# Patient Record
Sex: Female | Born: 1955
Health system: Southern US, Community
[De-identification: ages and names within clinical notes are randomized; demographics above are authoritative.]

## PROBLEM LIST (undated history)

## (undated) DIAGNOSIS — I10 Essential (primary) hypertension: Secondary | ICD-10-CM

## (undated) DIAGNOSIS — M439 Deforming dorsopathy, unspecified: Secondary | ICD-10-CM

## (undated) DIAGNOSIS — M1990 Unspecified osteoarthritis, unspecified site: Secondary | ICD-10-CM

## (undated) DIAGNOSIS — R112 Nausea with vomiting, unspecified: Secondary | ICD-10-CM

## (undated) DIAGNOSIS — Z86018 Personal history of other benign neoplasm: Secondary | ICD-10-CM

## (undated) DIAGNOSIS — I341 Nonrheumatic mitral (valve) prolapse: Secondary | ICD-10-CM

## (undated) DIAGNOSIS — Z9889 Other specified postprocedural states: Secondary | ICD-10-CM

## (undated) DIAGNOSIS — R251 Tremor, unspecified: Secondary | ICD-10-CM

## (undated) DIAGNOSIS — F419 Anxiety disorder, unspecified: Secondary | ICD-10-CM

## (undated) HISTORY — DX: Personal history of other benign neoplasm: Z86.018

## (undated) HISTORY — DX: Anxiety disorder, unspecified: F41.9

## (undated) HISTORY — PX: COLONOSCOPY: SHX174

## (undated) HISTORY — PX: COSMETIC SURGERY: SHX468

## (undated) HISTORY — PX: EYE SURGERY: SHX253

---

## 1979-01-26 DIAGNOSIS — O1490 Unspecified pre-eclampsia, unspecified trimester: Secondary | ICD-10-CM

## 2004-05-22 ENCOUNTER — Ambulatory Visit: Payer: Self-pay

## 2004-08-20 ENCOUNTER — Ambulatory Visit: Payer: Self-pay | Admitting: Obstetrics and Gynecology

## 2005-08-23 ENCOUNTER — Ambulatory Visit: Payer: Self-pay | Admitting: Obstetrics and Gynecology

## 2006-08-25 ENCOUNTER — Ambulatory Visit: Payer: Self-pay | Admitting: Obstetrics and Gynecology

## 2007-08-28 ENCOUNTER — Ambulatory Visit: Payer: Self-pay | Admitting: Obstetrics and Gynecology

## 2008-08-28 ENCOUNTER — Ambulatory Visit: Payer: Self-pay | Admitting: Gynecology

## 2013-04-24 LAB — HM DEXA SCAN

## 2013-09-28 LAB — HM MAMMOGRAPHY

## 2013-12-10 LAB — HEPATIC FUNCTION PANEL
ALT: 14 U/L (ref 7–35)
AST: 20 U/L (ref 13–35)

## 2013-12-10 LAB — LIPID PANEL
CHOLESTEROL: 216 mg/dL — AB (ref 0–200)
HDL: 91 mg/dL — AB (ref 35–70)
LDL Cholesterol: 107 mg/dL
Triglycerides: 92 mg/dL (ref 40–160)

## 2013-12-10 LAB — BASIC METABOLIC PANEL
BUN: 14 mg/dL (ref 4–21)
Creatinine: 0.7 mg/dL (ref 0.5–1.1)
Glucose: 106 mg/dL
POTASSIUM: 4 mmol/L (ref 3.4–5.3)
SODIUM: 139 mmol/L (ref 137–147)

## 2013-12-10 LAB — CBC AND DIFFERENTIAL
HCT: 38 % (ref 36–46)
HEMOGLOBIN: 12.8 g/dL (ref 12.0–16.0)
Platelets: 235 10*3/uL (ref 150–399)
WBC: 5.6 10*3/mL

## 2013-12-10 LAB — TSH: TSH: 2.91 u[IU]/mL (ref 0.41–5.90)

## 2014-05-23 ENCOUNTER — Encounter: Payer: Self-pay | Admitting: *Deleted

## 2014-05-23 NOTE — Anesthesia Preprocedure Evaluation (Deleted)

## 2014-05-30 NOTE — Discharge Instructions (Signed)

## 2014-05-31 ENCOUNTER — Ambulatory Visit: Payer: BLUE CROSS/BLUE SHIELD | Admitting: Anesthesiology

## 2014-05-31 ENCOUNTER — Encounter: Payer: Self-pay | Admitting: *Deleted

## 2014-05-31 ENCOUNTER — Ambulatory Visit
Admission: RE | Admit: 2014-05-31 | Discharge: 2014-05-31 | Disposition: A | Discharge status: Home / Self Care | Payer: BLUE CROSS/BLUE SHIELD | Source: Ambulatory Visit | Attending: Gastroenterology | Admitting: Gastroenterology

## 2014-05-31 ENCOUNTER — Encounter
Admission: RE | Disposition: A | Discharge status: Home / Self Care | Payer: Self-pay | Source: Ambulatory Visit | Attending: Gastroenterology

## 2014-05-31 DIAGNOSIS — K635 Polyp of colon: Secondary | ICD-10-CM | POA: Diagnosis not present

## 2014-05-31 DIAGNOSIS — K5730 Diverticulosis of large intestine without perforation or abscess without bleeding: Secondary | ICD-10-CM | POA: Insufficient documentation

## 2014-05-31 DIAGNOSIS — Z1211 Encounter for screening for malignant neoplasm of colon: Secondary | ICD-10-CM | POA: Insufficient documentation

## 2014-05-31 DIAGNOSIS — K640 First degree hemorrhoids: Secondary | ICD-10-CM | POA: Insufficient documentation

## 2014-05-31 DIAGNOSIS — I10 Essential (primary) hypertension: Secondary | ICD-10-CM | POA: Diagnosis not present

## 2014-05-31 DIAGNOSIS — I341 Nonrheumatic mitral (valve) prolapse: Secondary | ICD-10-CM | POA: Diagnosis not present

## 2014-05-31 HISTORY — PX: COLONOSCOPY: SHX5424

## 2014-05-31 HISTORY — DX: Other specified postprocedural states: R11.2

## 2014-05-31 HISTORY — DX: Deforming dorsopathy, unspecified: M43.9

## 2014-05-31 HISTORY — DX: Essential (primary) hypertension: I10

## 2014-05-31 HISTORY — DX: Nausea with vomiting, unspecified: R11.2

## 2014-05-31 HISTORY — DX: Tremor, unspecified: R25.1

## 2014-05-31 HISTORY — DX: Nonrheumatic mitral (valve) prolapse: I34.1

## 2014-05-31 HISTORY — DX: Nausea with vomiting, unspecified: Z98.890

## 2014-05-31 LAB — HM COLONOSCOPY

## 2014-05-31 SURGERY — COLONOSCOPY
Anesthesia: Monitor Anesthesia Care | Wound class: Contaminated

## 2014-05-31 MED ORDER — ACETAMINOPHEN 325 MG PO TABS
325.0000 mg | ORAL_TABLET | ORAL | Status: DC | PRN
Start: 2014-05-31 — End: 2014-05-31

## 2014-05-31 MED ORDER — PROPOFOL 10 MG/ML IV BOLUS
INTRAVENOUS | Status: DC | PRN
  Administered 2014-05-31 (×6): 50 mg via INTRAVENOUS

## 2014-05-31 MED ORDER — STERILE WATER FOR IRRIGATION IR SOLN
Status: DC | PRN
  Administered 2014-05-31: 11:00:00

## 2014-05-31 MED ORDER — LACTATED RINGERS IV SOLN
INTRAVENOUS | Status: DC
  Administered 2014-05-31: 10:00:00 via INTRAVENOUS

## 2014-05-31 MED ORDER — LIDOCAINE HCL (CARDIAC) 20 MG/ML IV SOLN
INTRAVENOUS | Status: DC | PRN
  Administered 2014-05-31: 50 mg via INTRAVENOUS

## 2014-05-31 MED ORDER — ACETAMINOPHEN 160 MG/5ML PO SOLN
325.0000 mg | ORAL | Status: DC | PRN
Start: 2014-05-31 — End: 2014-05-31

## 2014-05-31 MED ORDER — SODIUM CHLORIDE 0.9 % IV SOLN: INTRAVENOUS | Status: DC

## 2014-05-31 SURGICAL SUPPLY — 27 items
CANISTER SUCT 1200ML W/VALVE (MISCELLANEOUS) ×2 IMPLANT
FCP ESCP3.2XJMB 240X2.8X (MISCELLANEOUS) ×1
FORCEPS BIOP RAD 4 LRG CAP 4 (CUTTING FORCEPS) IMPLANT
FORCEPS BIOP RJ4 240 W/NDL (MISCELLANEOUS) ×1
FORCEPS ESCP3.2XJMB 240X2.8X (MISCELLANEOUS) ×1 IMPLANT
GOWN CVR UNV OPN BCK APRN NK (MISCELLANEOUS) ×2 IMPLANT
GOWN ISOL THUMB LOOP REG UNIV (MISCELLANEOUS) ×2
HEMOCLIP INSTINCT (CLIP) IMPLANT
INJECTOR VARIJECT VIN23 (MISCELLANEOUS) IMPLANT
KIT CO2 TUBING (TUBING) ×2 IMPLANT
KIT DEFENDO VALVE AND CONN (KITS) IMPLANT
KIT ENDO PROCEDURE OLY (KITS) ×2 IMPLANT
LIGATOR MULTIBAND 6SHOOTER MBL (MISCELLANEOUS) IMPLANT
MARKER SPOT ENDO TATTOO 5ML (MISCELLANEOUS) IMPLANT
PAD GROUND ADULT SPLIT (MISCELLANEOUS) IMPLANT
SNARE SHORT THROW 13M SML OVAL (MISCELLANEOUS) IMPLANT
SNARE SHORT THROW 30M LRG OVAL (MISCELLANEOUS) IMPLANT
SPOT EX ENDOSCOPIC TATTOO (MISCELLANEOUS)
SUCTION POLY TRAP 4CHAMBER (MISCELLANEOUS) IMPLANT
TRAP SUCTION POLY (MISCELLANEOUS) IMPLANT
TUBING CONN 6MMX3.1M (TUBING)
TUBING SUCTION CONN 0.25 STRL (TUBING) IMPLANT
UNDERPAD 30X60 958B10 (PK) (MISCELLANEOUS) IMPLANT
VALVE BIOPSY ENDO (VALVE) IMPLANT
VARIJECT INJECTOR VIN23 (MISCELLANEOUS)
WATER AUXILLARY (MISCELLANEOUS) IMPLANT
WATER STERILE IRR 500ML POUR (IV SOLUTION) ×2 IMPLANT

## 2014-05-31 NOTE — OR Nursing (Signed)
SCOPE IN MARKED FOR COLON IN TIME. IT WAS CORRECTED. THERE IS NO UPPER SCOPE IN TIME ONLY COLON SCOPE IN TIME.

## 2014-05-31 NOTE — Anesthesia Preprocedure Evaluation (Signed)
Anesthesia Evaluation    Reviewed: Allergy & Precautions, H&P , Patient's Chart, lab work & pertinent test results, reviewed documented beta blocker date and time   History of Anesthesia Complications (+) PONV and history of anesthetic complications  Airway Mallampati: I  TM Distance: >3 FB Neck ROM: full    Dental no notable dental hx.    Pulmonary neg pulmonary ROS,    Pulmonary exam normal       Cardiovascular hypertension, Normal cardiovascular exam    Neuro/Psych    GI/Hepatic negative GI ROS, Neg liver ROS,   Endo/Other  negative endocrine ROS  Renal/GU negative Renal ROS     Musculoskeletal   Abdominal   Peds  Hematology negative hematology ROS (+)   Anesthesia Other Findings   Reproductive/Obstetrics                             Anesthesia Physical Anesthesia Plan  ASA: II  Anesthesia Plan: MAC   Post-op Pain Management:    Induction:   Airway Management Planned:   Additional Equipment:   Intra-op Plan:   Post-operative Plan:   Informed Consent: I have reviewed the patients History and Physical, chart, labs and discussed the procedure including the risks, benefits and alternatives for the proposed anesthesia with the patient or authorized representative who has indicated his/her understanding and acceptance.     Plan Discussed with: CRNA  Anesthesia Plan Comments:         Anesthesia Quick Evaluation

## 2014-05-31 NOTE — Transfer of Care (Signed)
Immediate Anesthesia Transfer of Care Note  Patient: Tracie Hall  Procedure(s) Performed: Procedure(s): COLONOSCOPY (N/A)  Patient Location: PACU  Anesthesia Type: MAC  Level of Consciousness: awake, alert  and patient cooperative  Airway and Oxygen Therapy: Patient Spontanous Breathing and Patient connected to supplemental oxygen  Post-op Assessment: Post-op Vital signs reviewed, Patient's Cardiovascular Status Stable, Respiratory Function Stable, Patent Airway and No signs of Nausea or vomiting  Post-op Vital Signs: Reviewed and stable  Complications: No apparent anesthesia complications

## 2014-05-31 NOTE — Anesthesia Procedure Notes (Signed)
Procedure Name: MAC Performed by: Kynesha Guerin Pre-anesthesia Checklist: Patient identified, Emergency Drugs available, Suction available, Timeout performed and Patient being monitored Patient Re-evaluated:Patient Re-evaluated prior to inductionOxygen Delivery Method: Nasal cannula Placement Confirmation: positive ETCO2     

## 2014-05-31 NOTE — Op Note (Signed)
[facility] Patient Name: Tracie Hall Procedure Date: 05/31/2014 11:11 AM MRN: [id number] Account #: [id number] Date of Birth: February 11, 1955 Admit Type: Outpatient Age: 59 Room: [facility] OR ROOM 01 Gender: Female Note Status: Finalized Procedure:         Colonoscopy Indications:       Screening for colorectal malignant neoplasm Providers:         Lucilla Lame, MD Medicines:         Propofol per Anesthesia Complications:     No immediate complications. Procedure:         Pre-Anesthesia Assessment:                    - Prior to the procedure, a History and Physical was                     performed, and patient medications and allergies were                     reviewed. The patient's tolerance of previous anesthesia                     was also reviewed. The risks and benefits of the procedure                     and the sedation options and risks were discussed with the                     patient. All questions were answered, and informed consent                     was obtained. Prior Anticoagulants: The patient has taken                     no previous anticoagulant or antiplatelet agents. ASA                     Grade Assessment: II - A patient with mild systemic                     disease. After reviewing the risks and benefits, the                     patient was deemed in satisfactory condition to undergo                     the procedure.                    After obtaining informed consent, the colonoscope was                     passed under direct vision. Throughout the procedure, the                     patient's blood pressure, pulse, and oxygen saturations                     were monitored continuously. The Olympus CF H180AL                     colonoscope (S#: [id number]) was introduced through the anus                     and advanced to the the cecum, identified by  appendiceal                     orifice and ileocecal valve. The  colonoscopy was performed                     without difficulty. The patient tolerated the procedure                     well. The quality of the bowel preparation was excellent. Findings:      The perianal and digital rectal examinations were normal.      A 5 mm polyp was found in the ascending colon. The polyp was sessile.       The polyp was removed with a cold biopsy forceps. Resection and       retrieval were complete.      Multiple small-mouthed diverticula were found in the entire colon.      Non-bleeding internal hemorrhoids were found during retroflexion. The       hemorrhoids were Grade I (internal hemorrhoids that do not prolapse). Impression:        - One 5 mm polyp in the ascending colon. Resected and                     retrieved.                    - Diverticulosis in the entire examined colon.                    - Non-bleeding internal hemorrhoids. Recommendation:    - Await pathology results.                    - Repeat colonoscopy in 5 years if polyp adenoma and 10                     years if hyperplastic Procedure Code(s): --- Professional ---                    [id number], Colonoscopy, flexible; with biopsy, single or                     multiple Diagnosis Code(s): --- Professional ---                    Z12.11, Encounter for screening for malignant neoplasm of                     colon                    D12.2, Benign neoplasm of ascending colon CPT copyright 2014 American Medical Association. All rights reserved. The codes documented in this report are preliminary and upon coder review may  be revised to meet current compliance requirements. Lucilla Lame, MD 05/31/2014 11:38:09 AM This report has been signed electronically. Number of Addenda: 0 Note Initiated On: 05/31/2014 11:11 AM Scope Withdrawal Time: 0 hours 7 minutes 3 seconds  Total Procedure Duration: 0 hours 10 minutes 43 seconds       [facility]

## 2014-05-31 NOTE — Anesthesia Postprocedure Evaluation (Signed)
  Anesthesia Post-op Note  Patient: Tracie Hall  Procedure(s) Performed: Procedure(s): COLONOSCOPY (N/A)  Anesthesia type:MAC  Patient location: PACU  Post pain: Pain level controlled  Post assessment: Post-op Vital signs reviewed, Patient's Cardiovascular Status Stable, Respiratory Function Stable, Patent Airway and No signs of Nausea or vomiting  Post vital signs: Reviewed and stable  Last Vitals:  Filed Vitals:   05/31/14 1155  BP:   Pulse: 70  Temp:   Resp: 13    Level of consciousness: awake, alert  and patient cooperative  Complications: No apparent anesthesia complications
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
## 2014-06-04 ENCOUNTER — Encounter: Payer: Self-pay | Admitting: Gastroenterology

## 2014-06-28 ENCOUNTER — Encounter: Payer: Self-pay | Admitting: Gastroenterology

## 2014-08-19 ENCOUNTER — Other Ambulatory Visit: Payer: Self-pay | Admitting: Family Medicine

## 2014-08-19 DIAGNOSIS — I10 Essential (primary) hypertension: Secondary | ICD-10-CM

## 2014-10-11 ENCOUNTER — Telehealth: Payer: Self-pay | Admitting: Family Medicine

## 2014-10-11 NOTE — Telephone Encounter (Signed)
Needs to schedule with me. Needs to check with insurance whether she should get it here or pharmacy. Thanks.

## 2014-10-11 NOTE — Telephone Encounter (Signed)
Pt is requesting to have a shingles shot.XY#[phone]/OL

## 2014-10-11 NOTE — Telephone Encounter (Signed)
Left message to call back  

## 2014-10-11 NOTE — Telephone Encounter (Signed)
Can patient be scheduled during flu clinic or does she need to have an OV? Please advise. Thanks!

## 2014-10-15 NOTE — Telephone Encounter (Signed)
LMTCB 10/15/2014  Thanks,   -Mickel Baas

## 2014-10-15 NOTE — Telephone Encounter (Signed)
Scheduled pt for an ov and shingles vaccine for 11/12/2014 at 02:30 pm. Pt stated that she checked with her insurance and that it covers it.   Thanks,

## 2014-11-12 ENCOUNTER — Ambulatory Visit (INDEPENDENT_AMBULATORY_CARE_PROVIDER_SITE_OTHER): Payer: BLUE CROSS/BLUE SHIELD | Admitting: Family Medicine

## 2014-11-12 ENCOUNTER — Ambulatory Visit: Payer: Self-pay | Admitting: Family Medicine

## 2014-11-12 ENCOUNTER — Encounter: Payer: Self-pay | Admitting: Family Medicine

## 2014-11-12 VITALS — BP 138/72 | HR 88 | Temp 97.7°F | Resp 16 | Ht 66.0 in | Wt 147.0 lb

## 2014-11-12 DIAGNOSIS — I10 Essential (primary) hypertension: Secondary | ICD-10-CM

## 2014-11-12 DIAGNOSIS — Z23 Encounter for immunization: Secondary | ICD-10-CM | POA: Diagnosis not present
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
## 2015-01-17 ENCOUNTER — Encounter: Payer: Self-pay | Admitting: Family Medicine

## 2015-05-07 ENCOUNTER — Ambulatory Visit (INDEPENDENT_AMBULATORY_CARE_PROVIDER_SITE_OTHER): Payer: BLUE CROSS/BLUE SHIELD | Admitting: Obstetrics and Gynecology

## 2015-05-07 ENCOUNTER — Encounter: Payer: Self-pay | Admitting: Obstetrics and Gynecology

## 2015-05-07 VITALS — BP 144/81 | HR 78 | Ht 66.0 in | Wt 148.9 lb

## 2015-05-07 DIAGNOSIS — Z780 Asymptomatic menopausal state: Secondary | ICD-10-CM

## 2015-05-07 DIAGNOSIS — N895 Stricture and atresia of vagina: Secondary | ICD-10-CM

## 2015-05-07 DIAGNOSIS — N952 Postmenopausal atrophic vaginitis: Secondary | ICD-10-CM | POA: Diagnosis not present

## 2015-05-07 DIAGNOSIS — Z01419 Encounter for gynecological examination (general) (routine) without abnormal findings: Secondary | ICD-10-CM

## 2015-05-07 DIAGNOSIS — Z1239 Encounter for other screening for malignant neoplasm of breast: Secondary | ICD-10-CM | POA: Diagnosis not present

## 2015-05-07 MED ORDER — ESTROGENS, CONJUGATED 0.625 MG/GM VA CREA
0.5000 g | TOPICAL_CREAM | VAGINAL | Status: DC
Start: 2015-05-07 — End: 2015-07-14
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
## 2015-05-07 NOTE — Patient Instructions (Addendum)
1. Pap smear is completed 2. Mammogram is scheduled 3. Stool guaiac cards not given for colon cancer screening (colonoscopy completed in 05/2014) 4. Continue with calcium and vitamin D supplementation 5. Trial of Premarin cream intravaginal 1/2 g twice a week 6. Return in 3 months for follow-up 7. Return in 1 year for annual exam [email or web address] (website for [employer])

## 2015-05-12 LAB — PAP IG AND HPV HIGH-RISK
HPV, HIGH-RISK: NEGATIVE
PAP Smear Comment: 0

## 2015-05-14 ENCOUNTER — Telehealth: Payer: Self-pay | Admitting: *Deleted

## 2015-05-14 NOTE — Telephone Encounter (Signed)
Patient called and stted that [place] called her about her lab results and she just need some clarification on the results she received. Patient is requesting a call back . Call back number [phone]

## 2015-05-16 NOTE — Telephone Encounter (Signed)
Answered pts question. Aware pap neg. Pt may want pap yearly.

## 2015-05-21 ENCOUNTER — Ambulatory Visit (INDEPENDENT_AMBULATORY_CARE_PROVIDER_SITE_OTHER): Payer: BLUE CROSS/BLUE SHIELD | Admitting: Family Medicine

## 2015-05-21 ENCOUNTER — Encounter: Payer: Self-pay | Admitting: Family Medicine

## 2015-05-21 VITALS — BP 122/72 | HR 80 | Temp 97.6°F | Resp 16 | Ht 66.0 in | Wt 150.0 lb

## 2015-05-21 DIAGNOSIS — G250 Essential tremor: Secondary | ICD-10-CM | POA: Insufficient documentation

## 2015-05-21 DIAGNOSIS — Z1159 Encounter for screening for other viral diseases: Secondary | ICD-10-CM

## 2015-05-21 DIAGNOSIS — E559 Vitamin D deficiency, unspecified: Secondary | ICD-10-CM | POA: Insufficient documentation

## 2015-05-21 DIAGNOSIS — I10 Essential (primary) hypertension: Secondary | ICD-10-CM

## 2015-05-21 DIAGNOSIS — R739 Hyperglycemia, unspecified: Secondary | ICD-10-CM

## 2015-05-21 DIAGNOSIS — F4320 Adjustment disorder, unspecified: Secondary | ICD-10-CM | POA: Insufficient documentation

## 2015-05-21 DIAGNOSIS — I341 Nonrheumatic mitral (valve) prolapse: Secondary | ICD-10-CM | POA: Insufficient documentation

## 2015-05-21 DIAGNOSIS — R011 Cardiac murmur, unspecified: Secondary | ICD-10-CM | POA: Insufficient documentation

## 2015-05-21 DIAGNOSIS — M47812 Spondylosis without myelopathy or radiculopathy, cervical region: Secondary | ICD-10-CM | POA: Insufficient documentation

## 2015-05-21 DIAGNOSIS — Z8739 Personal history of other diseases of the musculoskeletal system and connective tissue: Secondary | ICD-10-CM | POA: Insufficient documentation

## 2015-05-21 DIAGNOSIS — I8390 Asymptomatic varicose veins of unspecified lower extremity: Secondary | ICD-10-CM | POA: Insufficient documentation
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
## 2015-05-22 LAB — LIPID PANEL
Cholesterol: 204 mg/dL — AB (ref 0–200)
HDL: 90 mg/dL — AB (ref 35–70)
LDL Cholesterol: 94 mg/dL
TRIGLYCERIDES: 98 mg/dL (ref 40–160)

## 2015-05-22 LAB — BASIC METABOLIC PANEL
BUN: 16 mg/dL (ref 4–21)
CREATININE: 0.7 mg/dL (ref 0.5–1.1)
Glucose: 72 mg/dL
POTASSIUM: 4.1 mmol/L (ref 3.4–5.3)
SODIUM: 143 mmol/L (ref 137–147)

## 2015-05-22 LAB — CBC AND DIFFERENTIAL
HEMATOCRIT: 40 % (ref 36–46)
Hemoglobin: 12.8 g/dL (ref 12.0–16.0)
Platelets: 224 10*3/uL (ref 150–399)
WBC: 6.1 10^3/mL

## 2015-05-22 LAB — HEPATIC FUNCTION PANEL
ALT: 13 U/L (ref 7–35)
AST: 22 U/L (ref 13–35)
Alkaline Phosphatase: 74 U/L (ref 25–125)
BILIRUBIN, TOTAL: 0.7 mg/dL

## 2015-05-22 LAB — TSH: TSH: 2.3 u[IU]/mL (ref 0.41–5.90)

## 2015-05-22 LAB — HEMOGLOBIN A1C: HEMOGLOBIN A1C: 5.9

## 2015-05-26 ENCOUNTER — Telehealth: Payer: Self-pay | Admitting: Family Medicine

## 2015-05-26 ENCOUNTER — Encounter: Payer: Self-pay | Admitting: Family Medicine

## 2015-05-26 DIAGNOSIS — I10 Essential (primary) hypertension: Secondary | ICD-10-CM

## 2015-05-26 MED ORDER — LISINOPRIL 10 MG PO TABS: 10.0000 mg | ORAL_TABLET | Freq: Every day | ORAL | Status: DC

## 2015-05-26 NOTE — Telephone Encounter (Signed)
Pt advised as directed below.   Thanks,   -Javeria Briski  

## 2015-05-26 NOTE — Telephone Encounter (Signed)
Pt is returning call.  [id number]

## 2015-05-26 NOTE — Telephone Encounter (Signed)
Pt advised of lab work.  She reports that she is taking Metoprolol and she has heard that it can increase blood sugar.  She would like to try a different blood pressure medication if you agree.   Contact number [phone] (work) or [phone] (Cell)   Thanks,   -Mickel Baas

## 2015-05-26 NOTE — Telephone Encounter (Signed)
Sent new rx.  Recommend cut Metoprolol in half for one week and then stop.  Start Lisinopril 10 mg 1/2  A day for one week and then one a day. Monitor BP. Thanks.

## 2015-06-11 ENCOUNTER — Encounter: Payer: Self-pay | Admitting: Family Medicine

## 2015-06-11 ENCOUNTER — Ambulatory Visit (INDEPENDENT_AMBULATORY_CARE_PROVIDER_SITE_OTHER): Payer: BLUE CROSS/BLUE SHIELD | Admitting: Family Medicine

## 2015-06-11 VITALS — BP 132/68 | HR 88 | Temp 97.5°F | Resp 16 | Wt 144.0 lb

## 2015-06-11 DIAGNOSIS — R202 Paresthesia of skin: Secondary | ICD-10-CM | POA: Diagnosis not present
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
## 2015-07-14 ENCOUNTER — Encounter: Payer: Self-pay | Admitting: Family Medicine

## 2015-07-14 ENCOUNTER — Ambulatory Visit (INDEPENDENT_AMBULATORY_CARE_PROVIDER_SITE_OTHER): Payer: BLUE CROSS/BLUE SHIELD | Admitting: Family Medicine

## 2015-07-14 ENCOUNTER — Other Ambulatory Visit: Payer: Self-pay

## 2015-07-14 VITALS — BP 140/80 | HR 72 | Temp 97.4°F | Resp 16 | Ht 65.5 in | Wt 142.0 lb

## 2015-07-14 DIAGNOSIS — E538 Deficiency of other specified B group vitamins: Secondary | ICD-10-CM | POA: Diagnosis not present

## 2015-07-14 DIAGNOSIS — Z0000 Encounter for general adult medical examination without abnormal findings: Secondary | ICD-10-CM

## 2015-07-14 DIAGNOSIS — N952 Postmenopausal atrophic vaginitis: Secondary | ICD-10-CM

## 2015-07-14 LAB — POCT URINALYSIS DIPSTICK
Bilirubin, UA: NEGATIVE
Glucose, UA: NEGATIVE
Ketones, UA: NEGATIVE
Leukocytes, UA: NEGATIVE
NITRITE UA: NEGATIVE
Protein, UA: NEGATIVE
RBC UA: NEGATIVE
Spec Grav, UA: 1.015
UROBILINOGEN UA: 0.2
pH, UA: 6.5
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
## 2015-07-14 NOTE — Patient Instructions (Signed)
Please start taking over the counter vitamin B-12 2000 IU daily.

## 2015-07-16 ENCOUNTER — Encounter: Payer: Self-pay | Admitting: Family Medicine

## 2015-08-07 ENCOUNTER — Encounter: Payer: Self-pay | Admitting: Obstetrics and Gynecology

## 2015-08-07 ENCOUNTER — Ambulatory Visit (INDEPENDENT_AMBULATORY_CARE_PROVIDER_SITE_OTHER): Payer: BLUE CROSS/BLUE SHIELD | Admitting: Obstetrics and Gynecology

## 2015-08-07 VITALS — BP 121/70 | HR 80 | Ht 66.0 in | Wt 142.6 lb

## 2015-08-07 DIAGNOSIS — N952 Postmenopausal atrophic vaginitis: Secondary | ICD-10-CM | POA: Diagnosis not present

## 2015-08-07 DIAGNOSIS — N895 Stricture and atresia of vagina: Secondary | ICD-10-CM | POA: Diagnosis not present

## 2015-08-07 NOTE — Patient Instructions (Signed)
1. Recommend Premarin cream intravaginal twice a week 2. Recommend vaginal dilator therapy as discussed 3. Recommend lubricants as discussed: Astroglide or Jo H2O lubricant 4. Return in 3 months for follow-up 5. Alternative therapies for vaginal atrophy which were discussed include: Vagifem pellets and Osphena oral tablets

## 2015-08-07 NOTE — Progress Notes (Signed)
Chief complaint: 1. Vaginal atrophy 2. Vaginal stenosis  Patient presents for 3 month follow-up. She delayed in starting Premarin vaginal cream use as well as vaginal dilator therapy because of concerns regarding health benefit/side effects. Multiple questions were addressed today. Alternatives of management were reviewed.  Recommendations: 1. Recommend Premarin cream intravaginal twice a week 2. Recommend vaginal dilator therapy as discussed 3. Recommend lubricants as discussed: Astroglide or Jo H2O lubricant 4. Return in 3 months for follow-up 5. Alternative therapies for vaginal atrophy which were discussed include: Vagifem pellets and Osphena oral tablets  A total of 25 minutes were spent face-to-face with the patient during this encounter and over half of that time involved counseling and coordination of care.  Brayton Mars, MD  Note: This dictation was prepared with Dragon dictation along with smaller phrase technology. Any transcriptional errors that result from this process are unintentional.

## 2015-08-11 ENCOUNTER — Other Ambulatory Visit: Payer: Self-pay | Admitting: Family Medicine

## 2015-08-11 DIAGNOSIS — L578 Other skin changes due to chronic exposure to nonionizing radiation: Secondary | ICD-10-CM | POA: Diagnosis not present

## 2015-08-11 DIAGNOSIS — L820 Inflamed seborrheic keratosis: Secondary | ICD-10-CM | POA: Diagnosis not present

## 2015-08-11 DIAGNOSIS — I10 Essential (primary) hypertension: Secondary | ICD-10-CM

## 2015-08-11 DIAGNOSIS — L700 Acne vulgaris: Secondary | ICD-10-CM | POA: Diagnosis not present

## 2015-10-01 ENCOUNTER — Ambulatory Visit
Admission: RE | Admit: 2015-10-01 | Discharge: 2015-10-01 | Disposition: A | Discharge status: Home / Self Care | Payer: BLUE CROSS/BLUE SHIELD | Source: Ambulatory Visit | Attending: Obstetrics and Gynecology | Admitting: Obstetrics and Gynecology

## 2015-10-01 DIAGNOSIS — Z1239 Encounter for other screening for malignant neoplasm of breast: Secondary | ICD-10-CM

## 2015-10-01 DIAGNOSIS — Z1231 Encounter for screening mammogram for malignant neoplasm of breast: Secondary | ICD-10-CM | POA: Diagnosis not present

## 2015-10-15 ENCOUNTER — Ambulatory Visit: Payer: BLUE CROSS/BLUE SHIELD | Admitting: Physician Assistant

## 2015-10-20 ENCOUNTER — Ambulatory Visit (INDEPENDENT_AMBULATORY_CARE_PROVIDER_SITE_OTHER): Payer: BLUE CROSS/BLUE SHIELD | Admitting: Physician Assistant

## 2015-10-20 ENCOUNTER — Encounter: Payer: Self-pay | Admitting: Physician Assistant

## 2015-10-20 VITALS — BP 140/76 | HR 76 | Temp 97.4°F | Resp 16 | Ht 66.0 in | Wt 142.0 lb

## 2015-10-20 DIAGNOSIS — E538 Deficiency of other specified B group vitamins: Secondary | ICD-10-CM

## 2015-10-20 NOTE — Progress Notes (Signed)
       Patient: Tracie Hall Female    DOB: [birth year]   60 y.o.   MRN: [id number] Visit Date: 10/20/2015  Today's Provider: Mar Daring, PA-C   Chief Complaint  Patient presents with  . Follow-up    vitamin b-12 deficiency   Subjective:    HPI  Follow up for vitamin B-12 dificiency  The patient was last seen for this 3 months ago. Changes made at last visit include stating OTC vitamin B-12 2000 IU daily.  She reports excellent compliance with treatment. She feels that condition is Improved. Patient reports that the numbness has improved in her toes. She is not having side effects.   ------------------------------------------------------------------------------------       No Known Allergies   Current Outpatient Prescriptions:  .  CALCIUM-VITAMIN D PO, Take 600 mg by mouth daily., Disp: , Rfl:  .  conjugated estrogens (PREMARIN) vaginal cream, Place 1 Applicatorful vaginally. 2 days a week, Disp: , Rfl:  .  cyanocobalamin 2000 MCG tablet, Take 2,000 mcg by mouth daily., Disp: , Rfl:  .  metoprolol succinate (TOPROL-XL) 25 MG 24 hr tablet, TAKE 1 TABLET BY MOUTH EVERY DAY, Disp: 90 tablet, Rfl: 1 .  Multiple Vitamin (MULTIVITAMIN) capsule, Take 1 capsule by mouth daily., Disp: , Rfl:   Review of Systems  Constitutional: Negative.   Respiratory: Negative.   Cardiovascular: Negative.   Gastrointestinal: Negative.   Skin: Negative.   Neurological: Positive for numbness (improving).    Social History  Substance Use Topics  . Smoking status: Never Smoker  . Smokeless tobacco: Never Used  . Alcohol use Yes     Comment: occasional   Objective:   BP 140/76 (BP Location: Right Arm, Patient Position: Sitting, Cuff Size: Normal)   Pulse 76   Temp 97.4 F (36.3 C) (Oral)   Resp 16   Ht 5\' 6"  (1.676 m)   Wt 142 lb (64.4 kg)   BMI 22.92 kg/m   Physical Exam  Constitutional: She appears well-developed and well-nourished. No distress.  Neck: Normal  range of motion. Neck supple. No tracheal deviation present. No thyromegaly present.  Cardiovascular: Normal rate, regular rhythm and normal heart sounds.  Exam reveals no gallop and no friction rub.   No murmur heard. Pulmonary/Chest: Effort normal and breath sounds normal. No respiratory distress. She has no wheezes. She has no rales.  Musculoskeletal: She exhibits no edema.  Lymphadenopathy:    She has no cervical adenopathy.  Skin: She is not diaphoretic.  Psychiatric: She has a normal mood and affect. Her behavior is normal. Judgment and thought content normal.  Vitals reviewed.     Assessment & Plan:     1. Vitamin B 12 deficiency I will recheck labs as below and follow-up pending lab results. She is to continue taking B12 over-the-counter supplement at 2000 international units daily. If labs have not shown an increase with adding supplementation we may consider switching from oral supplement to injectable. - B12 - [place], PA-C  [facility]

## 2015-10-20 NOTE — Patient Instructions (Signed)

## 2015-11-03 ENCOUNTER — Encounter: Payer: Self-pay | Admitting: Physician Assistant

## 2015-11-06 ENCOUNTER — Telehealth: Payer: Self-pay

## 2015-11-06 ENCOUNTER — Ambulatory Visit: Payer: BLUE CROSS/BLUE SHIELD | Admitting: Obstetrics and Gynecology

## 2015-11-06 NOTE — Telephone Encounter (Signed)
Advised of labs results.  Thanks.

## 2015-11-06 NOTE — Telephone Encounter (Signed)
Patient advised as directed below.  Thanks,  -Chanel Mcadams 

## 2015-11-06 NOTE — Telephone Encounter (Signed)
Patient is requesting a call back with lab results from 10/29/2015. Labs are scanned in. Patient had these done at another facility. CB#[id number]

## 2015-11-12 ENCOUNTER — Encounter: Payer: Self-pay | Admitting: Physician Assistant

## 2015-11-12 DIAGNOSIS — D485 Neoplasm of uncertain behavior of skin: Secondary | ICD-10-CM | POA: Diagnosis not present

## 2015-11-12 DIAGNOSIS — Z1283 Encounter for screening for malignant neoplasm of skin: Secondary | ICD-10-CM | POA: Diagnosis not present

## 2015-11-12 DIAGNOSIS — L812 Freckles: Secondary | ICD-10-CM | POA: Diagnosis not present

## 2015-11-12 DIAGNOSIS — L570 Actinic keratosis: Secondary | ICD-10-CM | POA: Diagnosis not present

## 2015-11-12 DIAGNOSIS — L578 Other skin changes due to chronic exposure to nonionizing radiation: Secondary | ICD-10-CM | POA: Diagnosis not present

## 2015-11-13 ENCOUNTER — Encounter: Payer: Self-pay | Admitting: Obstetrics and Gynecology

## 2015-11-13 ENCOUNTER — Ambulatory Visit (INDEPENDENT_AMBULATORY_CARE_PROVIDER_SITE_OTHER): Payer: BLUE CROSS/BLUE SHIELD | Admitting: Obstetrics and Gynecology

## 2015-11-13 VITALS — BP 149/76 | HR 78 | Ht 66.0 in | Wt 139.9 lb

## 2015-11-13 DIAGNOSIS — N895 Stricture and atresia of vagina: Secondary | ICD-10-CM | POA: Diagnosis not present

## 2015-11-13 DIAGNOSIS — N952 Postmenopausal atrophic vaginitis: Secondary | ICD-10-CM | POA: Diagnosis not present

## 2015-11-13 NOTE — Patient Instructions (Signed)
1. Continue Premarin cream intravaginal once or twice a week 2. Recommend vaginal dilator therapy as discussed  Website: www.[email or web address].com -  medical grade dilators 3. Return in 3 months for follow-up

## 2015-11-13 NOTE — Progress Notes (Signed)
Chief complaint: 1. Vaginal atrophy 2. Vaginal stenosis  Patient presents for 3 month follow-up. She has been using her Premarin cream intravaginal only once a week. She has not started the vaginal dilator therapy as of yet. Concerns regarding dementia history in the family and side effects of Premarin cream were addressed.  Past medical history, past surgical history, problem list, medications, and allergies are reviewed  OBJECTIVE: BP (!) 149/76   Pulse 78   Ht 5\' 6"  (1.676 m)   Wt 139 lb 14.4 oz (63.5 kg)   BMI 22.58 kg/m  Well-appearing white female in no acute distress Pelvic: External genitalia-mild atrophic changes BUS-normal Vagina-fair estrogen effect; introitus still very stenotic admitting single digit, snug Cervix-partially seen with Terance Hart pediatric speculum; non-tender Uterus-midplane, normal size and shape, nontender Adnexa-nonpalpable and nontender Rectovaginal-normal external exam  ASSESSMENT: 1. Vaginal atrophy, improvement with Premarin cream therapy 2. Stenosis of the vagina  PLAN: 1. Continue with Premarin cream once or twice a week intravaginal 2. Recommend again vaginal dilators to help with introitus stenosis 3. Return in 3 months 4. Information on  [place] given.  A total of 15 minutes were spent face-to-face with the patient during this encounter and over half of that time dealt with counseling and coordination of care.  Brayton Mars, MD  Note: This dictation was prepared with Dragon dictation along with smaller phrase technology. Any transcriptional errors that result from this process are unintentional.

## 2015-11-18 ENCOUNTER — Encounter: Payer: Self-pay | Admitting: Physician Assistant

## 2015-12-03 ENCOUNTER — Ambulatory Visit (INDEPENDENT_AMBULATORY_CARE_PROVIDER_SITE_OTHER): Payer: BLUE CROSS/BLUE SHIELD | Admitting: Family Medicine

## 2015-12-03 ENCOUNTER — Encounter: Payer: Self-pay | Admitting: Family Medicine

## 2015-12-03 VITALS — BP 130/70 | HR 84 | Temp 98.7°F | Resp 16 | Wt 140.0 lb

## 2015-12-03 DIAGNOSIS — R202 Paresthesia of skin: Secondary | ICD-10-CM | POA: Diagnosis not present

## 2015-12-03 DIAGNOSIS — E538 Deficiency of other specified B group vitamins: Secondary | ICD-10-CM

## 2015-12-03 MED ORDER — CYANOCOBALAMIN 2000 MCG PO TABS: 1000.0000 ug | ORAL_TABLET | Freq: Every day | ORAL | 1 refills | Status: DC

## 2015-12-03 NOTE — Patient Instructions (Signed)
Please have B12 and HgbA1c checked around the end of January

## 2015-12-03 NOTE — Progress Notes (Signed)
       Patient: Tracie Hall Female    DOB: 03-03-1955   60 y.o.   MRN: [id number] Visit Date: 12/03/2015  Today's Provider: Lelon Huh, MD   Chief Complaint  Patient presents with  . Numbness   Subjective:    HPI This is a previous patient of Dr. Venia Minks present today as new patient to me to establish care and follow up on chronic medical problems.   Paresthesia Patient was seen in May for tingling sensation in toes of both feet. She had checked  B12 checked at work which was, low normal at 239 and borderline elevation of A1c at 5.9%. She has since been taking 2000 units b12 a day and noticed slight improvement in tingling sensation. She also admits to wearing shoes with raised heels and sometimes squeeze her toes. Had b12 rechecked on 10/27/15 and was high at 1868.    No Known Allergies   Current Outpatient Prescriptions:  .  CALCIUM-VITAMIN D PO, Take 600 mg by mouth daily., Disp: , Rfl:  .  conjugated estrogens (PREMARIN) vaginal cream, Place 1 Applicatorful vaginally. 2 days a week, Disp: , Rfl:  .  cyanocobalamin 2000 MCG tablet, Take 2,000 mcg by mouth daily., Disp: , Rfl:  .  metoprolol succinate (TOPROL-XL) 25 MG 24 hr tablet, TAKE 1 TABLET BY MOUTH EVERY DAY, Disp: 90 tablet, Rfl: 1 .  Multiple Vitamin (MULTIVITAMIN) capsule, Take 1 capsule by mouth daily., Disp: , Rfl:   Review of Systems  Constitutional: Negative for appetite change, chills, fatigue and fever.  Respiratory: Negative for chest tightness and shortness of breath.   Cardiovascular: Negative for chest pain and palpitations.  Gastrointestinal: Negative for abdominal pain, nausea and vomiting.  Neurological: Negative for dizziness and weakness.    Social History  Substance Use Topics  . Smoking status: Never Smoker  . Smokeless tobacco: Never Used  . Alcohol use Yes     Comment: occasional   Objective:   BP 130/70 (BP Location: Left Arm, Patient Position: Sitting, Cuff Size: Normal)   Pulse  84   Temp 98.7 F (37.1 C) (Oral)   Resp 16   Wt 140 lb (63.5 kg)   BMI 22.60 kg/m   Physical Exam  Exam: +3 bilateral pedal pulses. Cap refill < 3 seconds. Normal s/s both feet and toes.     Assessment & Plan:     1. Vitamin B 12 deficiency She is to cut back to 1000 units a day and have b12 checked at work in 2-3 months.   2. Paresthesia Possibly related to b12 deficiency, but suspect some mechanical issues related to footware.  No sign of any other systemic nerve disorder.        Lelon Huh, MD  [facility]

## 2016-02-01 ENCOUNTER — Other Ambulatory Visit: Payer: Self-pay | Admitting: Physician Assistant

## 2016-02-01 DIAGNOSIS — I10 Essential (primary) hypertension: Secondary | ICD-10-CM

## 2016-02-10 ENCOUNTER — Encounter: Payer: BLUE CROSS/BLUE SHIELD | Admitting: Obstetrics and Gynecology

## 2016-04-19 ENCOUNTER — Encounter: Payer: Self-pay | Admitting: Medical

## 2016-04-19 ENCOUNTER — Ambulatory Visit: Payer: Self-pay | Admitting: Medical

## 2016-04-19 VITALS — BP 140/78 | HR 76 | Temp 98.0°F | Resp 16 | Ht 65.0 in | Wt 140.0 lb

## 2016-04-19 DIAGNOSIS — L853 Xerosis cutis: Secondary | ICD-10-CM

## 2016-04-19 MED ORDER — DESONIDE 0.05 % EX CREA: TOPICAL_CREAM | Freq: Two times a day (BID) | CUTANEOUS | 0 refills | Status: DC

## 2016-04-19 NOTE — Progress Notes (Signed)
   Subjective:    Patient ID: Tracie Hall, female    DOB: 04/24/1955, 61 y.o.   MRN: [id number]  HPI 61 yo with  dry eye lid on the right upper lid and some below x  2 weeks using a cream on cetaphil without much relief. The cream caused some stinging to the area.   Review of Systems  Constitutional: Negative for fever.  Eyes: Positive for pain. Negative for discharge, redness, itching and visual disturbance.    No swelling , no redness,  Some stinging at time of applying lotion. No visual changes.      Objective:   Physical Exam  Dry skin noted on the right upper lid, no flaking , no erythema. FROM of lid.        Assessment & Plan:  Will dry Desowen cream sparingly amount apply with Q-tip  Twice daily  3-5 days. Return to the clinic if not improving. Do not get into eyes.

## 2016-05-03 ENCOUNTER — Telehealth (INDEPENDENT_AMBULATORY_CARE_PROVIDER_SITE_OTHER): Payer: BLUE CROSS/BLUE SHIELD | Admitting: Obstetrics and Gynecology

## 2016-05-03 ENCOUNTER — Other Ambulatory Visit: Payer: Self-pay | Admitting: Obstetrics and Gynecology

## 2016-05-03 ENCOUNTER — Other Ambulatory Visit: Payer: BLUE CROSS/BLUE SHIELD

## 2016-05-03 DIAGNOSIS — R3915 Urgency of urination: Secondary | ICD-10-CM | POA: Diagnosis not present

## 2016-05-03 DIAGNOSIS — R319 Hematuria, unspecified: Secondary | ICD-10-CM | POA: Diagnosis not present

## 2016-05-03 DIAGNOSIS — R300 Dysuria: Secondary | ICD-10-CM | POA: Diagnosis not present

## 2016-05-03 LAB — POCT URINALYSIS DIPSTICK
BILIRUBIN UA: NEGATIVE
Glucose, UA: NEGATIVE
KETONES UA: NEGATIVE
Nitrite, UA: POSITIVE
PH UA: 6 (ref 5.0–8.0)
Protein, UA: NEGATIVE
Spec Grav, UA: 1.015 (ref 1.030–1.035)
Urobilinogen, UA: NEGATIVE (ref ?–2.0)

## 2016-05-03 MED ORDER — NITROFURANTOIN MONOHYD MACRO 100 MG PO CAPS: 100.0000 mg | ORAL_CAPSULE | Freq: Two times a day (BID) | ORAL | 0 refills | Status: DC

## 2016-05-03 NOTE — Telephone Encounter (Signed)
Pt has dysuria, urgency, cloudy urine x 3d. No fever or back pain. Pt dropped off urine sample. Pos for nitrites and blood. Will erx macrobid. Will contact pt when culture is back.

## 2016-05-03 NOTE — Telephone Encounter (Signed)
Patient is having painful urination with burning, urge to go, cloudy urine. SHe believes she has a UTI and wants to know if a medication can be called for her  [facility] [address]

## 2016-05-06 ENCOUNTER — Telehealth: Payer: Self-pay | Admitting: Obstetrics and Gynecology

## 2016-05-06 NOTE — Telephone Encounter (Signed)
Pt aware she will need to ask the pharmacist about any s/e with macobid d/t vit D def. Pt is feeling better. Will contact her when culture is in.

## 2016-05-06 NOTE — Telephone Encounter (Signed)
Patient read side effects for the medication she is taking for the UTI and noticed that it recommeds she inform the MD that she has a Vitamin B12 deficiency and to make sure that all is well with this. She did say that she is feeling better  Please call

## 2016-05-07 LAB — URINE CULTURE
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
## 2016-06-01 ENCOUNTER — Ambulatory Visit (INDEPENDENT_AMBULATORY_CARE_PROVIDER_SITE_OTHER): Payer: BLUE CROSS/BLUE SHIELD | Admitting: Obstetrics and Gynecology

## 2016-06-01 ENCOUNTER — Encounter: Payer: Self-pay | Admitting: Obstetrics and Gynecology

## 2016-06-01 VITALS — BP 151/70 | HR 90 | Ht 65.0 in | Wt 147.3 lb

## 2016-06-01 DIAGNOSIS — Z780 Asymptomatic menopausal state: Secondary | ICD-10-CM | POA: Diagnosis not present

## 2016-06-01 DIAGNOSIS — Z01419 Encounter for gynecological examination (general) (routine) without abnormal findings: Secondary | ICD-10-CM

## 2016-06-01 DIAGNOSIS — Z1231 Encounter for screening mammogram for malignant neoplasm of breast: Secondary | ICD-10-CM

## 2016-06-01 DIAGNOSIS — N895 Stricture and atresia of vagina: Secondary | ICD-10-CM | POA: Diagnosis not present

## 2016-06-01 DIAGNOSIS — Z1211 Encounter for screening for malignant neoplasm of colon: Secondary | ICD-10-CM

## 2016-06-01 DIAGNOSIS — N952 Postmenopausal atrophic vaginitis: Secondary | ICD-10-CM | POA: Diagnosis not present

## 2016-06-01 DIAGNOSIS — Z1239 Encounter for other screening for malignant neoplasm of breast: Secondary | ICD-10-CM

## 2016-06-01 NOTE — Patient Instructions (Addendum)
1. No Pap smear until 2020 2. Mammogram is ordered 3. Stool guaiac cards are given for colon cancer screening 4. Continue with healthy eating and exercise 5. Continue with calcium and vitamin D supplementation daily 6. Return in 3 months for follow-up on vaginal stenosis and dyspareunia 7. Return in 1 year for annual exam   Health Maintenance for Postmenopausal Women Menopause is a normal process in which your reproductive ability comes to an end. This process happens gradually over a span of months to years, usually between the ages of 27 and 26. Menopause is complete when you have missed 12 consecutive menstrual periods. It is important to talk with your health care provider about some of the most common conditions that affect postmenopausal women, such as heart disease, cancer, and bone loss (osteoporosis). Adopting a healthy lifestyle and getting preventive care can help to promote your health and wellness. Those actions can also lower your chances of developing some of these common conditions. What should I know about menopause? During menopause, you may experience a number of symptoms, such as:  Moderate-to-severe hot flashes.  Night sweats.  Decrease in sex drive.  Mood swings.  Headaches.  Tiredness.  Irritability.  Memory problems.  Insomnia. Choosing to treat or not to treat menopausal changes is an individual decision that you make with your health care provider. What should I know about hormone replacement therapy and supplements? Hormone therapy products are effective for treating symptoms that are associated with menopause, such as hot flashes and night sweats. Hormone replacement carries certain risks, especially as you become older. If you are thinking about using estrogen or estrogen with progestin treatments, discuss the benefits and risks with your health care provider. What should I know about heart disease and stroke? Heart disease, heart attack, and stroke  become more likely as you age. This may be due, in part, to the hormonal changes that your body experiences during menopause. These can affect how your body processes dietary fats, triglycerides, and cholesterol. Heart attack and stroke are both medical emergencies. There are many things that you can do to help prevent heart disease and stroke:  Have your blood pressure checked at least every 1-2 years. High blood pressure causes heart disease and increases the risk of stroke.  If you are 33-25 years old, ask your health care provider if you should take aspirin to prevent a heart attack or a stroke.  Do not use any tobacco products, including cigarettes, chewing tobacco, or electronic cigarettes. If you need help quitting, ask your health care provider.  It is important to eat a healthy diet and maintain a healthy weight.  Be sure to include plenty of vegetables, fruits, low-fat dairy products, and lean protein.  Avoid eating foods that are high in solid fats, added sugars, or salt (sodium).  Get regular exercise. This is one of the most important things that you can do for your health.  Try to exercise for at least 150 minutes each week. The type of exercise that you do should increase your heart rate and make you sweat. This is known as moderate-intensity exercise.  Try to do strengthening exercises at least twice each week. Do these in addition to the moderate-intensity exercise.  Know your numbers.Ask your health care provider to check your cholesterol and your blood glucose. Continue to have your blood tested as directed by your health care provider. What should I know about cancer screening? There are several types of cancer. Take the following steps to  reduce your risk and to catch any cancer development as early as possible. Breast Cancer  Practice breast self-awareness.  This means understanding how your breasts normally appear and feel.  It also means doing regular breast  self-exams. Let your health care provider know about any changes, no matter how small.  If you are 44 or older, have a clinician do a breast exam (clinical breast exam or CBE) every year. Depending on your age, family history, and medical history, it may be recommended that you also have a yearly breast X-ray (mammogram).  If you have a family history of breast cancer, talk with your health care provider about genetic screening.  If you are at high risk for breast cancer, talk with your health care provider about having an MRI and a mammogram every year.  Breast cancer (BRCA) gene test is recommended for women who have family members with BRCA-related cancers. Results of the assessment will determine the need for genetic counseling and BRCA1 and for BRCA2 testing. BRCA-related cancers include these types:  Breast. This occurs in males or females.  Ovarian.  Tubal. This may also be called fallopian tube cancer.  Cancer of the abdominal or pelvic lining (peritoneal cancer).  Prostate.  Pancreatic. Cervical, Uterine, and Ovarian Cancer  Your health care provider may recommend that you be screened regularly for cancer of the pelvic organs. These include your ovaries, uterus, and vagina. This screening involves a pelvic exam, which includes checking for microscopic changes to the surface of your cervix (Pap test).  For women ages 21-65, health care providers may recommend a pelvic exam and a Pap test every three years. For women ages 15-65, they may recommend the Pap test and pelvic exam, combined with testing for human papilloma virus (HPV), every five years. Some types of HPV increase your risk of cervical cancer. Testing for HPV may also be done on women of any age who have unclear Pap test results.  Other health care providers may not recommend any screening for nonpregnant women who are considered low risk for pelvic cancer and have no symptoms. Ask your health care provider if a screening  pelvic exam is right for you.  If you have had past treatment for cervical cancer or a condition that could lead to cancer, you need Pap tests and screening for cancer for at least 20 years after your treatment. If Pap tests have been discontinued for you, your risk factors (such as having a new sexual partner) need to be reassessed to determine if you should start having screenings again. Some women have medical problems that increase the chance of getting cervical cancer. In these cases, your health care provider may recommend that you have screening and Pap tests more often.  If you have a family history of uterine cancer or ovarian cancer, talk with your health care provider about genetic screening.  If you have vaginal bleeding after reaching menopause, tell your health care provider.  There are currently no reliable tests available to screen for ovarian cancer. Lung Cancer  Lung cancer screening is recommended for adults 26-48 years old who are at high risk for lung cancer because of a history of smoking. A yearly low-dose CT scan of the lungs is recommended if you:  Currently smoke.  Have a history of at least 30 pack-years of smoking and you currently smoke or have quit within the past 15 years. A pack-year is smoking an average of one pack of cigarettes per day for one year.  Yearly screening should:  Continue until it has been 15 years since you quit.  Stop if you develop a health problem that would prevent you from having lung cancer treatment. Colorectal Cancer  This type of cancer can be detected and can often be prevented.  Routine colorectal cancer screening usually begins at age 68 and continues through age 39.  If you have risk factors for colon cancer, your health care provider may recommend that you be screened at an earlier age.  If you have a family history of colorectal cancer, talk with your health care provider about genetic screening.  Your health care provider  may also recommend using home test kits to check for hidden blood in your stool.  A small camera at the end of a tube can be used to examine your colon directly (sigmoidoscopy or colonoscopy). This is done to check for the earliest forms of colorectal cancer.  Direct examination of the colon should be repeated every 5-10 years until age 33. However, if early forms of precancerous polyps or small growths are found or if you have a family history or genetic risk for colorectal cancer, you may need to be screened more often. Skin Cancer  Check your skin from head to toe regularly.  Monitor any moles. Be sure to tell your health care provider:  About any new moles or changes in moles, especially if there is a change in a mole's shape or color.  If you have a mole that is larger than the size of a pencil eraser.  If any of your family members has a history of skin cancer, especially at a young age, talk with your health care provider about genetic screening.  Always use sunscreen. Apply sunscreen liberally and repeatedly throughout the day.  Whenever you are outside, protect yourself by wearing long sleeves, pants, a wide-brimmed hat, and sunglasses. What should I know about osteoporosis? Osteoporosis is a condition in which bone destruction happens more quickly than new bone creation. After menopause, you may be at an increased risk for osteoporosis. To help prevent osteoporosis or the bone fractures that can happen because of osteoporosis, the following is recommended:  If you are 71-31 years old, get at least 1,000 mg of calcium and at least 600 mg of vitamin D per day.  If you are older than age 58 but younger than age 52, get at least 1,200 mg of calcium and at least 600 mg of vitamin D per day.  If you are older than age 31, get at least 1,200 mg of calcium and at least 800 mg of vitamin D per day. Smoking and excessive alcohol intake increase the risk of osteoporosis. Eat foods that are  rich in calcium and vitamin D, and do weight-bearing exercises several times each week as directed by your health care provider. What should I know about how menopause affects my mental health? Depression may occur at any age, but it is more common as you become older. Common symptoms of depression include:  Low or sad mood.  Changes in sleep patterns.  Changes in appetite or eating patterns.  Feeling an overall lack of motivation or enjoyment of activities that you previously enjoyed.  Frequent crying spells. Talk with your health care provider if you think that you are experiencing depression. What should I know about immunizations? It is important that you get and maintain your immunizations. These include:  Tetanus, diphtheria, and pertussis (Tdap) booster vaccine.  Influenza every year before the flu  season begins.  Pneumonia vaccine.  Shingles vaccine. Your health care provider may also recommend other immunizations. This information is not intended to replace advice given to you by your health care provider. Make sure you discuss any questions you have with your health care provider. Document Released: 03/05/2005 Document Revised: 08/01/2015 Document Reviewed: 10/15/2014 Elsevier Interactive Patient Education  2017 [employer].

## 2016-06-13 DIAGNOSIS — Z1211 Encounter for screening for malignant neoplasm of colon: Secondary | ICD-10-CM | POA: Diagnosis not present

## 2016-06-17 LAB — FECAL OCCULT BLOOD, IMMUNOCHEMICAL: Fecal Occult Bld: NEGATIVE

## 2016-06-24 ENCOUNTER — Other Ambulatory Visit: Payer: Self-pay | Admitting: Obstetrics and Gynecology

## 2016-06-24 DIAGNOSIS — Z1231 Encounter for screening mammogram for malignant neoplasm of breast: Secondary | ICD-10-CM

## 2016-06-25 DIAGNOSIS — L821 Other seborrheic keratosis: Secondary | ICD-10-CM | POA: Diagnosis not present

## 2016-06-25 DIAGNOSIS — L308 Other specified dermatitis: Secondary | ICD-10-CM | POA: Diagnosis not present

## 2016-06-25 DIAGNOSIS — L309 Dermatitis, unspecified: Secondary | ICD-10-CM | POA: Diagnosis not present

## 2016-07-21 ENCOUNTER — Encounter: Payer: Self-pay | Admitting: Obstetrics and Gynecology

## 2016-07-22 ENCOUNTER — Other Ambulatory Visit: Payer: Self-pay

## 2016-07-22 MED ORDER — PRASTERONE 6.5 MG VA INST: 6.5000 mg | VAGINAL_INSERT | Freq: Every day | VAGINAL | 6 refills | Status: DC

## 2016-07-27 ENCOUNTER — Other Ambulatory Visit: Payer: Self-pay | Admitting: Physician Assistant

## 2016-07-27 DIAGNOSIS — I10 Essential (primary) hypertension: Secondary | ICD-10-CM

## 2016-07-29 ENCOUNTER — Ambulatory Visit: Payer: Self-pay | Admitting: Medical

## 2016-07-29 VITALS — BP 145/70 | HR 77 | Temp 98.2°F | Resp 16 | Ht 66.0 in | Wt 147.0 lb

## 2016-07-29 DIAGNOSIS — W57XXXA Bitten or stung by nonvenomous insect and other nonvenomous arthropods, initial encounter: Secondary | ICD-10-CM

## 2016-07-29 DIAGNOSIS — L089 Local infection of the skin and subcutaneous tissue, unspecified: Secondary | ICD-10-CM

## 2016-07-29 MED ORDER — AMOXICILLIN 875 MG PO TABS: 875.0000 mg | ORAL_TABLET | Freq: Two times a day (BID) | ORAL | 0 refills | Status: DC

## 2016-07-29 NOTE — Patient Instructions (Signed)
Insect Bite, Adult An insect bite can make your skin red, itchy, and swollen. Some insects can spread disease to people with a bite. However, most insect bites do not lead to disease, and most are not serious. Follow these instructions at home: Bite area care  Do not scratch the bite area.  Keep the bite area clean and dry.  Wash the bite area every day with soap and water as told by your doctor.  Check the bite area every day for signs of infection. Check for: ? More redness, swelling, or pain. ? Fluid or blood. ? Warmth. ? Pus. Managing pain, itching, and swelling  You may put any of these on the bite area as told by your doctor: ? A baking soda paste. ? Cortisone cream. ? Calamine lotion.  If directed, put ice on the bite area. ? Put ice in a plastic bag. ? Place a towel between your skin and the bag. ? Leave the ice on for 20 minutes, 2-3 times a day. Medicines  Take medicines or put medicines on your skin only as told by your doctor.  If you were prescribed an antibiotic medicine, use it as told by your doctor. Do not stop using the antibiotic even if your condition improves. General instructions  Keep all follow-up visits as told by your doctor. This is important. How is this prevented? To help you have a lower risk of insect bites:  When you are outside, wear clothing that covers your arms and legs.  Use insect repellent. The best insect repellents have: ? An active ingredient of DEET, picaridin, oil of lemon eucalyptus (OLE), or IR3535. ? Higher amounts of DEET or another active ingredient than other repellents have.  If your home windows do not have screens, think about putting some in.  Contact a doctor if:  You have more redness, swelling, or pain in the bite area.  You have fluid, blood, or pus coming from the bite area.  The bite area feels warm.  You have a fever. Get help right away if:  You have joint pain.  You have a rash.  You have  shortness of breath.  You feel more tired or sleepy than you normally do.  You have neck pain.  You have a headache.  You feel weaker than you normally do.  You have chest pain.  You have pain in your belly.  You feel sick to your stomach (nauseous) or you throw up (vomit). Summary  An insect bite can make your skin red, itchy, and swollen.  Do not scratch the bite area, and keep it clean and dry.  Ice can help with pain and itching from the bite. This information is not intended to replace advice given to you by your health care provider. Make sure you discuss any questions you have with your health care provider. Document Released: 01/09/2000 Document Revised: 08/14/2015 Document Reviewed: 05/29/2014 Elsevier Interactive Patient Education  2018 Elsevier Inc.  

## 2016-07-29 NOTE — Progress Notes (Signed)
   Subjective:    Patient ID: Tracie Hall, female    DOB: 1955-04-27, 61 y.o.   MRN: [id number]  HPI 61 yo bug bites on Monday, took OTC benadryl gel helped , took OTC benadryl tablet at  6am and 11:30am , not really itching, no pain. No fever or chills. Was vacationing on [place].   Review of Systems  Constitutional: Negative for fatigue and fever.  HENT: Negative for congestion, ear pain and sore throat.   Eyes: Negative for discharge and itching.  Respiratory: Negative for cough.   Cardiovascular: Negative for chest pain.  Gastrointestinal: Negative for abdominal pain.  Genitourinary: Negative for hematuria.  Musculoskeletal: Negative for myalgias.  Skin: Positive for wound.  Allergic/Immunologic: Negative for environmental allergies and food allergies.  Neurological: Negative for dizziness and syncope.       Objective:   Physical Exam  Constitutional: She is oriented to person, place, and time. She appears well-developed and well-nourished.  HENT:  Head: Normocephalic and atraumatic.  Eyes: Conjunctivae and EOM are normal.  Neck: Normal range of motion.  Cardiovascular: Normal rate and regular rhythm.  Exam reveals no gallop and no friction rub.   No murmur heard. Pulmonary/Chest: Effort normal and breath sounds normal.  Neurological: She is alert and oriented to person, place, and time.  Skin: Skin is warm and dry. There is erythema.  Psychiatric: She has a normal mood and affect. Her behavior is normal. Judgment and thought content normal.  Nursing note and vitals reviewed.       Multiple forearm bug bites  ( maculopapular and erythema areas) few at upper arms bilaterally Right foot medial side  2 areas Right side of chest. 2 areas maculopapular with circular redness approximately 2 cm circular each, areas marked for patient to watch.        Assessment & Plan:   Bug bites,  Skin infection, to wash twice daily with dilute soap and water, neosporin to raw   areas. e -prescribed Amoxil 875 mg  One by mouth twice daily with food. #14 no refill Return in  3-5 days if not improving. Patient verbalizes understanding and has no questions at discharge.

## 2016-08-02 DIAGNOSIS — L308 Other specified dermatitis: Secondary | ICD-10-CM | POA: Diagnosis not present

## 2016-08-04 DIAGNOSIS — L259 Unspecified contact dermatitis, unspecified cause: Secondary | ICD-10-CM | POA: Diagnosis not present

## 2016-08-09 DIAGNOSIS — L308 Other specified dermatitis: Secondary | ICD-10-CM | POA: Diagnosis not present

## 2016-09-02 ENCOUNTER — Encounter: Payer: BLUE CROSS/BLUE SHIELD | Admitting: Obstetrics and Gynecology

## 2016-09-07 ENCOUNTER — Encounter: Payer: Self-pay | Admitting: Obstetrics and Gynecology

## 2016-09-07 ENCOUNTER — Ambulatory Visit (INDEPENDENT_AMBULATORY_CARE_PROVIDER_SITE_OTHER): Payer: BLUE CROSS/BLUE SHIELD | Admitting: Obstetrics and Gynecology

## 2016-09-07 VITALS — BP 147/73 | HR 69 | Ht 66.0 in | Wt 149.2 lb

## 2016-09-07 DIAGNOSIS — N895 Stricture and atresia of vagina: Secondary | ICD-10-CM | POA: Diagnosis not present

## 2016-09-07 DIAGNOSIS — N952 Postmenopausal atrophic vaginitis: Secondary | ICD-10-CM

## 2016-09-07 NOTE — Patient Instructions (Signed)
Continue with Intrarosa daily. Continue with vaginal dilator therapy several times a week. Return in 3 months for f/u. Bring dilators at next visit.

## 2016-09-07 NOTE — Progress Notes (Signed)
DRAGON TRANSCRIPTION CORRUPT  Cc: 1. Vaginal stenosis   Consistently using Intrarosa daily Vaginal dilators - still at 3 of 5 sizes  BP (!) 147/73   Pulse 69   Ht 5\' 6"  (1.676 m)   Wt 149 lb 3.2 oz (67.7 kg)   BMI 24.08 kg/m  Vagina with moderate secretions. Fair E2 effect. Single digit exam- tight introitus accomodates to 2nd joint.  Asssessment: 1. Minimal improvement with therapy  Plan: Continue with Intrarosa daily. Continue with vaginal dilator therapy several times a week. Return in 3 months for f/u. Bring dilators at next visit.  A total of 15 minutes were spent face-to-face with the patient during this encounter and over half of that time dealt with counseling and coordination of care.  Brayton Mars, MD

## 2016-09-14 ENCOUNTER — Encounter: Payer: Self-pay | Admitting: Family Medicine

## 2016-09-14 ENCOUNTER — Ambulatory Visit (INDEPENDENT_AMBULATORY_CARE_PROVIDER_SITE_OTHER): Payer: BLUE CROSS/BLUE SHIELD | Admitting: Family Medicine

## 2016-09-14 VITALS — BP 128/70 | HR 88 | Temp 97.4°F | Resp 16 | Wt 150.0 lb

## 2016-09-14 DIAGNOSIS — E559 Vitamin D deficiency, unspecified: Secondary | ICD-10-CM | POA: Diagnosis not present

## 2016-09-14 DIAGNOSIS — I10 Essential (primary) hypertension: Secondary | ICD-10-CM

## 2016-09-14 DIAGNOSIS — E538 Deficiency of other specified B group vitamins: Secondary | ICD-10-CM

## 2016-09-14 DIAGNOSIS — R202 Paresthesia of skin: Secondary | ICD-10-CM | POA: Insufficient documentation

## 2016-09-14 NOTE — Progress Notes (Signed)
Patient: Tracie Hall Female    DOB: 09-21-1955   61 y.o.   MRN: [id number] Visit Date: 09/14/2016  Today's Provider: Lavon Paganini, MD   Chief Complaint  Patient presents with  . B12 Deficiency   Subjective:    HPI     Follow up for B12 Deficiency  The patient was last seen for this 8 months ago. Changes made at last visit include decreasing oral B12 supplements to 1000 mcgs po qd.  She reports fair compliance with treatment. She reports she is trying to take this on Mondays, Wednesdays and Fridays, but she will forget to take this. She feels that condition is Unchanged. She is still experiencing toe/foot paresthesias. She is not having side effects.   Her last B12 levels were checked 11/13/2016, and was 1868 at that time.  Paresthesias are worse at end of day after long periods of standing or when wearing heels/wedges.   No Known Allergies   Current Outpatient Prescriptions:  .  CALCIUM-VITAMIN D PO, Take 600 mg by mouth daily., Disp: , Rfl:  .  cyanocobalamin 2000 MCG tablet, Take 0.5 tablets (1,000 mcg total) by mouth daily. (Patient taking differently: Take 1,000 mcg by mouth daily. Is taking on Monday's, Wednesday's and Friday's), Disp: 1 tablet, Rfl: 1 .  metoprolol succinate (TOPROL-XL) 25 MG 24 hr tablet, TAKE 1 TABLET BY MOUTH EVERY DAY, Disp: 90 tablet, Rfl: 1 .  Multiple Vitamin (MULTIVITAMIN) capsule, Take 1 capsule by mouth daily., Disp: , Rfl:  .  Prasterone (INTRAROSA) 6.5 MG INST, Place 6.5 mg vaginally at bedtime., Disp: 30 each, Rfl: 6  Review of Systems  Constitutional: Negative for activity change, appetite change, chills, diaphoresis, fatigue, fever and unexpected weight change.  HENT: Negative.   Respiratory: Negative for shortness of breath.   Cardiovascular: Negative for chest pain, palpitations and leg swelling.  Gastrointestinal: Negative.   Endocrine: Negative.   Musculoskeletal: Negative.   Neurological: Positive for numbness.    Hematological: Negative.   Psychiatric/Behavioral: Negative.     Social History  Substance Use Topics  . Smoking status: Never Smoker  . Smokeless tobacco: Never Used  . Alcohol use Yes     Comment: occasional   Objective:   BP 128/70 (BP Location: Left Arm, Patient Position: Sitting, Cuff Size: Normal)   Pulse 88   Temp (!) 97.4 F (36.3 C) (Oral)   Resp 16   Wt 150 lb (68 kg)   BMI 24.21 kg/m  Vitals:   09/14/16 1612  BP: 128/70  Pulse: 88  Resp: 16  Temp: (!) 97.4 F (36.3 C)  TempSrc: Oral  Weight: 150 lb (68 kg)     Physical Exam  Constitutional: She is oriented to person, place, and time. She appears well-developed and well-nourished. No distress.  HENT:  Head: Normocephalic and atraumatic.  Eyes: Conjunctivae are normal. No scleral icterus.  Cardiovascular: Normal rate and regular rhythm.   Pulmonary/Chest: Effort normal. No respiratory distress.  Musculoskeletal: She exhibits no edema.  Loss of transverse arch in b/l feet, no TTP, decrease sensation to light touch in toes of b/l feet  Neurological: She is alert and oriented to person, place, and time.  Skin: Skin is warm and dry. No rash noted.  Psychiatric: She has a normal mood and affect. Her behavior is normal.  Vitals reviewed.       Assessment & Plan:         Vitamin B 12 deficiency Paresthesias thought to be related  to B12 deficiency Recheck B12 level and adjust supplementation as indicated  Paresthesias B12 deficiency management as above Wonder if this is related ot loss of transverse arch with wearing heels as well Advised on buying orthotics with metatarsal pads Check A1c  Avitaminosis D Recheck vit D level  Hypertension Needs follow-up visit for hypertension  Check CMP in meantime   The entirety of the information documented in the History of Present Illness, Review of Systems and Physical Exam were personally obtained by me. Portions of this information were initially  documented by Raquel Sarna Ratchford, CMA and reviewed by me for thoroughness and accuracy.    Lavon Paganini, MD  [facility]

## 2016-09-14 NOTE — Assessment & Plan Note (Signed)
Paresthesias thought to be related to B12 deficiency Recheck B12 level and adjust supplementation as indicated

## 2016-09-14 NOTE — Assessment & Plan Note (Signed)
B12 deficiency management as above Wonder if this is related ot loss of transverse arch with wearing heels as well Advised on buying orthotics with metatarsal pads Check A1c

## 2016-09-14 NOTE — Patient Instructions (Addendum)
Try looking for shoe orthotics with metatarsal pads You may find these on [place] or at [employer] in [place]   Paresthesia Paresthesia is a burning or prickling feeling. This feeling can happen in any part of the body. It often happens in the hands, arms, legs, or feet. Usually, it is not painful. In most cases, the feeling goes away in a short time and is not a sign of a serious problem. Follow these instructions at home:  Avoid drinking alcohol.  Try massage or needle therapy (acupuncture) to help with your problems.  Keep all follow-up visits as told by your doctor. This is important. Contact a doctor if:  You keep on having episodes of paresthesia.  Your burning or prickling feeling gets worse when you walk.  You have pain or cramps.  You feel dizzy.  You have a rash. Get help right away if:  You feel weak.  You have trouble walking or moving.  You have problems speaking, understanding, or seeing.  You feel confused.  You cannot control when you pee (urinate) or poop (bowel movement).  You lose feeling (numbness) after an injury.  You pass out (faint). This information is not intended to replace advice given to you by your health care provider. Make sure you discuss any questions you have with your health care provider. Document Released: 12/25/2007 Document Revised: 06/19/2015 Document Reviewed: 01/07/2014 Elsevier Interactive Patient Education  [employer].

## 2016-09-14 NOTE — Assessment & Plan Note (Signed)
- 

## 2016-09-14 NOTE — Assessment & Plan Note (Signed)
Needs follow-up visit for hypertension  Check CMP in meantime

## 2016-09-17 ENCOUNTER — Other Ambulatory Visit: Payer: Self-pay

## 2016-09-17 DIAGNOSIS — E538 Deficiency of other specified B group vitamins: Secondary | ICD-10-CM

## 2016-09-17 DIAGNOSIS — R200 Anesthesia of skin: Secondary | ICD-10-CM

## 2016-09-17 DIAGNOSIS — E559 Vitamin D deficiency, unspecified: Secondary | ICD-10-CM

## 2016-09-17 DIAGNOSIS — I10 Essential (primary) hypertension: Secondary | ICD-10-CM

## 2016-09-17 DIAGNOSIS — R202 Paresthesia of skin: Secondary | ICD-10-CM

## 2016-09-18 LAB — CBC WITH DIFFERENTIAL/PLATELET
BASOS ABS: 0 10*3/uL (ref 0.0–0.2)
Basos: 0 %
EOS (ABSOLUTE): 0.2 10*3/uL (ref 0.0–0.4)
Eos: 3 %
HEMOGLOBIN: 12.7 g/dL (ref 11.1–15.9)
Hematocrit: 39.7 % (ref 34.0–46.6)
IMMATURE GRANS (ABS): 0 10*3/uL (ref 0.0–0.1)
IMMATURE GRANULOCYTES: 0 %
LYMPHS ABS: 1.3 10*3/uL (ref 0.7–3.1)
Lymphs: 24 %
MCH: 29.1 pg (ref 26.6–33.0)
MCHC: 32 g/dL (ref 31.5–35.7)
MCV: 91 fL (ref 79–97)
MONOS ABS: 0.4 10*3/uL (ref 0.1–0.9)
Monocytes: 7 %
Neutrophils Absolute: 3.5 10*3/uL (ref 1.4–7.0)
Neutrophils: 66 %
PLATELETS: 228 10*3/uL (ref 150–379)
RBC: 4.37 x10E6/uL (ref 3.77–5.28)
RDW: 13.3 % (ref 12.3–15.4)
WBC: 5.4 10*3/uL (ref 3.4–10.8)

## 2016-09-18 LAB — HGB A1C W/O EAG: HEMOGLOBIN A1C: 5.8 % — AB (ref 4.8–5.6)

## 2016-09-18 LAB — VITAMIN D 25 HYDROXY (VIT D DEFICIENCY, FRACTURES): VIT D 25 HYDROXY: 34.1 ng/mL (ref 30.0–100.0)

## 2016-09-20 ENCOUNTER — Telehealth: Payer: Self-pay

## 2016-09-20 NOTE — Telephone Encounter (Signed)
-----   Message from Virginia Crews, MD sent at 09/20/2016  9:37 AM EDT ----- Normal blood counts, Vit D levels.  A1c remains in prediabetes range, but slightly better than last year (5.8 from 5.9).  Low carb diet is the treatment for this, so it does not evolve to diabetes.  Other labs have not resulted yet.  Virginia Crews, MD, MPH [facility] 09/20/2016 9:37 AM

## 2016-09-20 NOTE — Telephone Encounter (Signed)
[email or web address]

## 2016-09-20 NOTE — Telephone Encounter (Signed)
Pt advised and verbalizes understanding

## 2016-09-29 ENCOUNTER — Telehealth: Payer: Self-pay

## 2016-09-29 DIAGNOSIS — E559 Vitamin D deficiency, unspecified: Secondary | ICD-10-CM

## 2016-09-29 DIAGNOSIS — E538 Deficiency of other specified B group vitamins: Secondary | ICD-10-CM

## 2016-09-29 DIAGNOSIS — I10 Essential (primary) hypertension: Secondary | ICD-10-CM

## 2016-09-29 NOTE — Telephone Encounter (Signed)
Labs ordered due to not resulted by [facility] at 8/25 collection.

## 2016-10-05 ENCOUNTER — Ambulatory Visit
Admission: RE | Admit: 2016-10-05 | Discharge: 2016-10-05 | Disposition: A | Discharge status: Home / Self Care | Payer: BLUE CROSS/BLUE SHIELD | Source: Ambulatory Visit | Attending: Obstetrics and Gynecology | Admitting: Obstetrics and Gynecology

## 2016-10-05 DIAGNOSIS — Z1231 Encounter for screening mammogram for malignant neoplasm of breast: Secondary | ICD-10-CM | POA: Diagnosis not present

## 2016-10-06 ENCOUNTER — Other Ambulatory Visit: Payer: Self-pay | Admitting: Obstetrics and Gynecology

## 2016-10-06 DIAGNOSIS — R928 Other abnormal and inconclusive findings on diagnostic imaging of breast: Secondary | ICD-10-CM

## 2016-10-07 ENCOUNTER — Encounter: Payer: Self-pay | Admitting: Family Medicine

## 2016-10-07 ENCOUNTER — Ambulatory Visit
Admission: RE | Admit: 2016-10-07 | Discharge: 2016-10-07 | Disposition: A | Discharge status: Home / Self Care | Payer: BLUE CROSS/BLUE SHIELD | Source: Ambulatory Visit | Attending: Family Medicine | Admitting: Family Medicine

## 2016-10-07 ENCOUNTER — Ambulatory Visit (INDEPENDENT_AMBULATORY_CARE_PROVIDER_SITE_OTHER): Payer: BLUE CROSS/BLUE SHIELD | Admitting: Family Medicine

## 2016-10-07 ENCOUNTER — Telehealth: Payer: Self-pay

## 2016-10-07 DIAGNOSIS — M11261 Other chondrocalcinosis, right knee: Secondary | ICD-10-CM | POA: Insufficient documentation

## 2016-10-07 DIAGNOSIS — M25562 Pain in left knee: Secondary | ICD-10-CM | POA: Diagnosis not present

## 2016-10-07 DIAGNOSIS — M170 Bilateral primary osteoarthritis of knee: Secondary | ICD-10-CM | POA: Diagnosis not present

## 2016-10-07 DIAGNOSIS — M11262 Other chondrocalcinosis, left knee: Secondary | ICD-10-CM | POA: Diagnosis not present

## 2016-10-07 DIAGNOSIS — M179 Osteoarthritis of knee, unspecified: Secondary | ICD-10-CM | POA: Diagnosis not present

## 2016-10-07 DIAGNOSIS — M25569 Pain in unspecified knee: Secondary | ICD-10-CM | POA: Insufficient documentation

## 2016-10-07 NOTE — Telephone Encounter (Signed)
-----   Message from Virginia Crews, MD sent at 10/07/2016  3:14 PM EDT ----- Xrays show arthritis of the knee.  The bilateral standing Xrays shows that there is similar arthritis of the Right knee, even though it is not bothering patient currently.  Plan as discussed during office visit.  Virginia Crews, MD, MPH [facility] 10/07/2016 3:14 PM

## 2016-10-07 NOTE — Telephone Encounter (Signed)
Left message informing pt. OK per DPR.

## 2016-10-07 NOTE — Patient Instructions (Signed)
Try ibuprofen 800mg  three times daily or Aleve 2 pills twice daily (do not take these together) regularly for the next week.  Then use as needed.   Knee Pain, Adult Knee pain in adults is common. It can be caused by many things, including:  Arthritis.  A fluid-filled sac (cyst) or growth in your knee.  An infection in your knee.  An injury that will not heal.  Damage, swelling, or irritation of the tissues that support your knee.  Knee pain is usually not a sign of a serious problem. The pain may go away on its own with time and rest. If it does not, a health care provider may order tests to find the cause of the pain. These may include:  Imaging tests, such as an X-ray, MRI, or ultrasound.  Joint aspiration. In this test, fluid is removed from the knee.  Arthroscopy. In this test, a lighted tube is inserted into knee and an image is projected onto a TV screen.  A biopsy. In this test, a sample of tissue is removed from the body and studied under a microscope.  Follow these instructions at home: Pay attention to any changes in your symptoms. Take these actions to relieve your pain. Activity  Rest your knee.  Do not do things that cause pain or make pain worse.  Avoid high-impact activities or exercises, such as running, jumping rope, or doing jumping jacks. General instructions  Take over-the-counter and prescription medicines only as told by your health care provider.  Raise (elevate) your knee above the level of your heart when you are sitting or lying down.  Sleep with a pillow under your knee.  If directed, apply ice to the knee: ? Put ice in a plastic bag. ? Place a towel between your skin and the bag. ? Leave the ice on for 20 minutes, 2-3 times a day.  Ask your health care provider if you should wear an elastic knee support.  Lose weight if you are overweight. Extra weight can put pressure on your knee.  Do not use any products that contain nicotine or  tobacco, such as cigarettes and e-cigarettes. Smoking may slow the healing of any bone and joint problems that you may have. If you need help quitting, ask your health care provider. Contact a health care provider if:  Your knee pain continues, changes, or gets worse.  You have a fever along with knee pain.  Your knee buckles or locks up.  Your knee swells, and the swelling becomes worse. Get help right away if:  Your knee feels warm to the touch.  You cannot move your knee.  You have severe pain in your knee.  You have chest pain.  You have trouble breathing. Summary  Knee pain in adults is common. It can be caused by many things, including, arthritis, infection, cysts, or injury.  Knee pain is usually not a sign of a serious problem, but if it does not go away, a health care provider may perform tests to know the cause of the pain.  Pay attention to any changes in your symptoms. Relieve your pain with rest, medicines, light activity, and use of ice.  Get help if your pain continues or becomes very severe, or if your knee buckles or locks up, or if you have chest pain or trouble breathing. This information is not intended to replace advice given to you by your health care provider. Make sure you discuss any questions you have with your  health care provider. Document Released: 11/08/2006 Document Revised: 01/02/2016 Document Reviewed: 01/02/2016 Elsevier Interactive Patient Education  [employer].

## 2016-10-07 NOTE — Progress Notes (Signed)
Patient: Tracie Hall Female    DOB: 04-Dec-1955   61 y.o.   MRN: [id number] Visit Date: 10/07/2016  Today's Provider: Lavon Paganini, MD   Chief Complaint  Patient presents with  . Knee Pain   Subjective:    Knee Pain   There was no injury mechanism (pain has been present for 1 month). The pain is present in the left knee (lateral). The quality of the pain is described as aching. The pain is at a severity of 5/10. The pain is moderate. The pain has been intermittent since onset. Pertinent negatives include no inability to bear weight, loss of motion, loss of sensation, muscle weakness, numbness or tingling. Associated symptoms comments: Knee swelling. The symptoms are aggravated by weight bearing. She has tried NSAIDs and ice for the symptoms. The treatment provided mild relief.  Also with some stiffness after sitting for a long period of time. Has previously had pain when walking up stairs     No Known Allergies   Current Outpatient Prescriptions:  .  CALCIUM-VITAMIN D PO, Take 600 mg by mouth daily., Disp: , Rfl:  .  cyanocobalamin 2000 MCG tablet, Take 0.5 tablets (1,000 mcg total) by mouth daily. (Patient taking differently: Take 1,000 mcg by mouth daily. Is taking on Monday's, Wednesday's and Friday's), Disp: 1 tablet, Rfl: 1 .  metoprolol succinate (TOPROL-XL) 25 MG 24 hr tablet, TAKE 1 TABLET BY MOUTH EVERY DAY, Disp: 90 tablet, Rfl: 1 .  Multiple Vitamin (MULTIVITAMIN) capsule, Take 1 capsule by mouth daily., Disp: , Rfl:  .  Prasterone (INTRAROSA) 6.5 MG INST, Place 6.5 mg vaginally at bedtime., Disp: 30 each, Rfl: 6  Review of Systems  Constitutional: Negative for activity change, appetite change and fever.  Respiratory: Negative.   Cardiovascular: Negative.   Musculoskeletal: Positive for arthralgias and joint swelling. Negative for gait problem.  Neurological: Negative for dizziness, tingling, weakness, light-headedness and numbness.    Social History    Substance Use Topics  . Smoking status: Never Smoker  . Smokeless tobacco: Never Used  . Alcohol use Yes     Comment: occasional   Objective:   BP (!) 150/60 (BP Location: Left Arm, Patient Position: Sitting, Cuff Size: Normal)   Pulse 88   Temp (!) 97.4 F (36.3 C) (Oral)   Resp 16   Wt 147 lb (66.7 kg)   BMI 23.73 kg/m  Vitals:   10/07/16 1325  BP: (!) 150/60  Pulse: 88  Resp: 16  Temp: (!) 97.4 F (36.3 C)  TempSrc: Oral  Weight: 147 lb (66.7 kg)     Physical Exam  Constitutional: She is oriented to person, place, and time. She appears well-developed and well-nourished. No distress.  HENT:  Head: Normocephalic and atraumatic.  Eyes: No scleral icterus.  Cardiovascular: Normal rate and regular rhythm.   Pulmonary/Chest: Effort normal. No respiratory distress.  Musculoskeletal: She exhibits no edema.  L Knee: No erythema or obvious bony abnormalities.  Small effusion Palpation normal with no warmth or joint line tenderness or patellar tenderness or condyle tenderness. ROM normal in flexion and extension and lower leg rotation. Ligaments with solid consistent endpoints including ACL, PCL, LCL, MCL. Negative Mcmurray's and provocative meniscal tests. Non painful patellar compression. Patellar and quadriceps tendons unremarkable. Hamstring and quadriceps strength is normal. Gait intact  Neurological: She is alert and oriented to person, place, and time.  Skin: Skin is warm and dry. No rash noted.  Psychiatric: She has a normal mood and  affect. Her behavior is normal.  Vitals reviewed.       Assessment & Plan:      Problem List Items Addressed This Visit      Other   Lateral knee pain    Suspect OA with small effusion No evidence of meniscal injury and ligaments intact Obtain knee imaging to see degree of OA Advised ice, compressive sleeve as needed with strenuous activity NSAIDs x1 wk and then prn Return precautions discussed Could consider steroid  injection if not improved with more conservative managment      Relevant Orders   DG Knee Complete 4 Views Left   DG Knee Bilateral Standing AP          The entirety of the information documented in the History of Present Illness, Review of Systems and Physical Exam were personally obtained by me. Portions of this information were initially documented by Raquel Sarna Ratchford, CMA and reviewed by me for thoroughness and accuracy.     Lavon Paganini, MD  [facility]

## 2016-10-07 NOTE — Assessment & Plan Note (Signed)
Suspect OA with small effusion No evidence of meniscal injury and ligaments intact Obtain knee imaging to see degree of OA Advised ice, compressive sleeve as needed with strenuous activity NSAIDs x1 wk and then prn Return precautions discussed Could consider steroid injection if not improved with more conservative managment

## 2016-10-08 ENCOUNTER — Encounter: Payer: Self-pay | Admitting: Obstetrics and Gynecology

## 2016-10-08 ENCOUNTER — Other Ambulatory Visit: Payer: Self-pay

## 2016-10-12 ENCOUNTER — Ambulatory Visit
Admission: RE | Admit: 2016-10-12 | Discharge: 2016-10-12 | Disposition: A | Discharge status: Home / Self Care | Payer: BLUE CROSS/BLUE SHIELD | Source: Ambulatory Visit | Attending: Obstetrics and Gynecology | Admitting: Obstetrics and Gynecology

## 2016-10-12 ENCOUNTER — Other Ambulatory Visit: Payer: Self-pay | Admitting: Obstetrics and Gynecology

## 2016-10-12 DIAGNOSIS — N6322 Unspecified lump in the left breast, upper inner quadrant: Secondary | ICD-10-CM | POA: Diagnosis not present

## 2016-10-12 DIAGNOSIS — R922 Inconclusive mammogram: Secondary | ICD-10-CM | POA: Diagnosis not present

## 2016-10-12 DIAGNOSIS — R928 Other abnormal and inconclusive findings on diagnostic imaging of breast: Secondary | ICD-10-CM

## 2016-10-12 DIAGNOSIS — N6320 Unspecified lump in the left breast, unspecified quadrant: Secondary | ICD-10-CM

## 2016-10-13 ENCOUNTER — Other Ambulatory Visit: Payer: Self-pay

## 2016-10-13 DIAGNOSIS — I10 Essential (primary) hypertension: Secondary | ICD-10-CM

## 2016-10-13 DIAGNOSIS — E538 Deficiency of other specified B group vitamins: Secondary | ICD-10-CM

## 2016-10-13 DIAGNOSIS — E559 Vitamin D deficiency, unspecified: Secondary | ICD-10-CM

## 2016-10-14 LAB — SPECIMEN STATUS

## 2016-10-14 LAB — COMPREHENSIVE METABOLIC PANEL
A/G RATIO: 1.8 (ref 1.2–2.2)
ALBUMIN: 4.4 g/dL (ref 3.6–4.8)
ALT: 16 IU/L (ref 0–32)
AST: 26 IU/L (ref 0–40)
Alkaline Phosphatase: 69 IU/L (ref 39–117)
BILIRUBIN TOTAL: 0.7 mg/dL (ref 0.0–1.2)
BUN / CREAT RATIO: 20 (ref 12–28)
BUN: 16 mg/dL (ref 8–27)
CALCIUM: 9.5 mg/dL (ref 8.7–10.3)
CHLORIDE: 102 mmol/L (ref 96–106)
CO2: 23 mmol/L (ref 20–29)
Creatinine, Ser: 0.79 mg/dL (ref 0.57–1.00)
GFR, EST AFRICAN AMERICAN: 93 mL/min/{1.73_m2} (ref >59)
GFR, EST NON AFRICAN AMERICAN: 80 mL/min/{1.73_m2} (ref >59)
Globulin, Total: 2.4 g/dL (ref 1.5–4.5)
Glucose: 100 mg/dL — ABNORMAL HIGH (ref 65–99)
POTASSIUM: 4.5 mmol/L (ref 3.5–5.2)
Sodium: 143 mmol/L (ref 134–144)
TOTAL PROTEIN: 6.8 g/dL (ref 6.0–8.5)

## 2016-10-14 LAB — LIPID PANEL
CHOL/HDL RATIO: 2.2 ratio (ref 0.0–4.4)
Cholesterol, Total: 200 mg/dL — ABNORMAL HIGH (ref 100–199)
HDL: 92 mg/dL (ref >39)
LDL Calculated: 94 mg/dL (ref 0–99)
TRIGLYCERIDES: 70 mg/dL (ref 0–149)
VLDL Cholesterol Cal: 14 mg/dL (ref 5–40)

## 2016-10-14 LAB — B12 AND FOLATE PANEL
Folate: 14.7 ng/mL (ref >3.0)
Vitamin B-12: 620 pg/mL (ref 232–1245)

## 2016-10-18 ENCOUNTER — Telehealth: Payer: Self-pay

## 2016-10-18 NOTE — Telephone Encounter (Signed)
-----   Message from Virginia Crews, MD sent at 10/18/2016  9:37 AM EDT ----- Normal cholesterol, kidney function, liver function, electrolytes (blood glucose is slightly high if these were fasting labs), Vit B12 and folate levels.  CBC (blood counts) has not resulted yet, but I will watch out for the results.  Virginia Crews, MD, MPH [facility] 10/18/2016 9:37 AM

## 2016-10-18 NOTE — Telephone Encounter (Signed)
Pt's son advised. OK per DPR. He states he will inform pt of results.

## 2016-10-20 ENCOUNTER — Encounter: Payer: Self-pay | Admitting: Family Medicine

## 2016-10-20 ENCOUNTER — Ambulatory Visit (INDEPENDENT_AMBULATORY_CARE_PROVIDER_SITE_OTHER): Payer: BLUE CROSS/BLUE SHIELD | Admitting: Family Medicine

## 2016-10-20 VITALS — BP 130/68 | HR 76 | Temp 97.4°F | Resp 16 | Wt 149.0 lb

## 2016-10-20 DIAGNOSIS — R202 Paresthesia of skin: Secondary | ICD-10-CM

## 2016-10-20 DIAGNOSIS — I10 Essential (primary) hypertension: Secondary | ICD-10-CM | POA: Diagnosis not present

## 2016-10-20 MED ORDER — METOPROLOL SUCCINATE ER 25 MG PO TB24: 25.0000 mg | ORAL_TABLET | Freq: Every day | ORAL | 3 refills | Status: DC

## 2016-10-20 NOTE — Assessment & Plan Note (Signed)
Well controlled Continue Metoprolol Recent CMP wnl F/u in 1 yr

## 2016-10-20 NOTE — Assessment & Plan Note (Signed)
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

## 2016-10-20 NOTE — Patient Instructions (Signed)
Morton Neuralgia Morton neuralgia is a type of foot pain in the area closest to your toes. This area is sometimes called the ball of your foot. Morton neuralgia occurs when a branch of a nerve in your foot (digital nerve) becomes compressed. When this happens over a long period of time, the nerve can thicken (neuroma) and cause pain. This usually occurs between the third and fourth toe. Morton neuralgia can come and go but may get worse over time. What are the causes? Your digital nerve can become compressed and stretched at a point where it passes under a thick band of tissue that connects your toes (intermetatarsal ligament). Morton neuralgia can be caused by mild repetitive damage in this area. This type of damage can result from:  Activities such as running or jumping.  Wearing shoes that are too tight.  What increases the risk? You may be at risk for Morton neuralgia if you:  Are female.  Wear high heels.  Wear shoes that are narrow or tight.  Participate in activities that stretch your toes. These include: ? Running. ? Ballet. ? Long-distance walking.  What are the signs or symptoms? The first symptom of Morton neuralgia is pain that spreads from the ball of your foot to your toes. It may feel like you are walking on a marble. Pain usually gets worse with walking and goes away at night. Other symptoms may include numbness and cramping of your toes. How is this diagnosed? Your health care provider will do a physical exam. When doing the exam, your health care provider may:  Squeeze your foot just behind your toe.  Ask you to move your toes to check for pain.  You may also have tests on your foot to confirm the diagnosis. These may include:  An X-ray.  An MRI.  How is this treated? Treatment for Morton neuralgia may be as simple as changing the kind of shoes you wear. Other treatments may include:  Wearing a supportive pad (orthosis) under the front of your foot. This  lifts your toe bones and takes pressure off the nerve.  Getting injections of numbing medicine and anti-inflammatory medicine (steroid) in the nerve.  Having surgery to remove part of the thickened nerve.  Follow these instructions at home:  Take medicine only as directed by your health care provider.  Wear soft-soled shoes with a wide toe area.  Stop activities that may be causing pain.  Elevate your foot when resting.  Massage your foot.  Apply ice to the injured area: ? Put ice in a plastic bag. ? Place a towel between your skin and the bag. ? Leave the ice on for 20 minutes, 2-3 times a day.  Keep all follow-up visits as directed by your health care provider. This is important. Contact a health care provider if:  Home care instructions are not helping you get better.  Your symptoms change or get worse. This information is not intended to replace advice given to you by your health care provider. Make sure you discuss any questions you have with your health care provider. Document Released: 04/19/2000 Document Revised: 06/19/2015 Document Reviewed: 03/14/2013 Elsevier Interactive Patient Education  2018 [employer] Inc[employer]  

## 2016-10-20 NOTE — Progress Notes (Signed)
Patient: Tracie Hall Female    DOB: Oct 09, 1955   62 y.o.   MRN: [id number] Visit Date: 10/20/2016  Today's Provider: Lavon Paganini, MD   Chief Complaint  Patient presents with  . Hypertension   Subjective:    HPI      Hypertension, follow-up:  BP Readings from Last 3 Encounters:  10/20/16 130/68  10/07/16 (!) 150/60  09/14/16 128/70    She was last seen for hypertension 1 month ago.  BP at that visit was 128/70. Management since that visit includes continuing Metoprolol succinate 25 mg qd. She reports good compliance with treatment. She is not having side effects.  She is exercising. Walking. She is adherent to low salt diet.   Outside blood pressures are occasionally being checked; 130's/70's. She is experiencing none.  Patient denies chest pain, chest pressure/discomfort, claudication, dyspnea, exertional chest pressure/discomfort, fatigue, irregular heart beat, lower extremity edema, near-syncope, orthopnea, palpitations and syncope.   Cardiovascular risk factors include hypertension.    Weight trend: fluctuating a bit Wt Readings from Last 3 Encounters:  10/20/16 149 lb (67.6 kg)  10/07/16 147 lb (66.7 kg)  09/14/16 150 lb (68 kg)    Current diet: in general, a "healthy" diet    ------------------------------------------------------------------------ Pt reports the paresthesias of feet have improved since wearing orthotics with metatarsal pads, as recommended at [facility] on 09/14/2016. She is asking if she should be concerned about nerve damage. The paresthesias are still present if she does not wear the orthotics.   No Known Allergies   Current Outpatient Prescriptions:  .  CALCIUM-VITAMIN D PO, Take 600 mg by mouth daily., Disp: , Rfl:  .  cyanocobalamin 2000 MCG tablet, Take 0.5 tablets (1,000 mcg total) by mouth daily. (Patient taking differently: Take 1,000 mcg by mouth daily. Is taking on Monday's, Wednesday's and Friday's), Disp: 1 tablet,  Rfl: 1 .  metoprolol succinate (TOPROL-XL) 25 MG 24 hr tablet, TAKE 1 TABLET BY MOUTH EVERY DAY, Disp: 90 tablet, Rfl: 1 .  Multiple Vitamin (MULTIVITAMIN) capsule, Take 1 capsule by mouth daily., Disp: , Rfl:  .  Prasterone (INTRAROSA) 6.5 MG INST, Place 6.5 mg vaginally at bedtime., Disp: 30 each, Rfl: 6  Review of Systems  Constitutional: Negative for activity change, appetite change, chills, diaphoresis, fatigue, fever and unexpected weight change.  Respiratory: Negative for shortness of breath.   Cardiovascular: Negative for chest pain, palpitations and leg swelling.  Neurological: Positive for numbness.    Social History  Substance Use Topics  . Smoking status: Never Smoker  . Smokeless tobacco: Never Used  . Alcohol use Yes     Comment: occasional   Objective:   BP 130/68 (BP Location: Left Arm, Patient Position: Sitting, Cuff Size: Normal)   Pulse 76   Temp (!) 97.4 F (36.3 C) (Oral)   Resp 16   Wt 149 lb (67.6 kg)   BMI 24.05 kg/m  Vitals:   10/20/16 1605  BP: 130/68  Pulse: 76  Resp: 16  Temp: (!) 97.4 F (36.3 C)  TempSrc: Oral  Weight: 149 lb (67.6 kg)     Physical Exam  Constitutional: She is oriented to person, place, and time. She appears well-developed and well-nourished.  HENT:  Head: Normocephalic and atraumatic.  Eyes: Conjunctivae are normal. No scleral icterus.  Cardiovascular: Normal rate, regular rhythm and intact distal pulses.   No murmur heard. Pulmonary/Chest: Effort normal and breath sounds normal. No respiratory distress. She has no wheezes. She has  no rales.  Musculoskeletal: She exhibits no edema.  Neurological: She is alert and oriented to person, place, and time.  Skin: Skin is warm and dry. No rash noted.  Psychiatric: She has a normal mood and affect. Her behavior is normal.  Vitals reviewed.       Assessment & Plan:      Problem List Items Addressed This Visit      Cardiovascular and Mediastinum   Hypertension -  Primary    Well controlled Continue Metoprolol Recent CMP wnl F/u in 1 yr      Relevant Medications   metoprolol succinate (TOPROL-XL) 25 MG 24 hr tablet     Other   Paresthesias    Seems to be improving with Metatarsal pads Likely Mortons neuroma related to loss of transverse arch with wering heels F/u prn            The entirety of the information documented in the History of Present Illness, Review of Systems and Physical Exam were personally obtained by me. Portions of this information were initially documented by Raquel Sarna Ratchford, CMA and reviewed by me for thoroughness and accuracy.     Lavon Paganini, MD  [facility]

## 2016-11-29 DIAGNOSIS — D229 Melanocytic nevi, unspecified: Secondary | ICD-10-CM | POA: Diagnosis not present

## 2016-11-29 DIAGNOSIS — L820 Inflamed seborrheic keratosis: Secondary | ICD-10-CM | POA: Diagnosis not present

## 2016-11-29 DIAGNOSIS — Z1283 Encounter for screening for malignant neoplasm of skin: Secondary | ICD-10-CM | POA: Diagnosis not present

## 2016-11-29 DIAGNOSIS — L308 Other specified dermatitis: Secondary | ICD-10-CM | POA: Diagnosis not present

## 2016-11-29 DIAGNOSIS — D485 Neoplasm of uncertain behavior of skin: Secondary | ICD-10-CM | POA: Diagnosis not present

## 2016-12-15 ENCOUNTER — Ambulatory Visit: Payer: BLUE CROSS/BLUE SHIELD | Admitting: Obstetrics and Gynecology

## 2016-12-15 ENCOUNTER — Encounter: Payer: Self-pay | Admitting: Obstetrics and Gynecology

## 2016-12-15 VITALS — BP 151/83 | HR 81 | Ht 66.0 in | Wt 151.9 lb

## 2016-12-15 DIAGNOSIS — N952 Postmenopausal atrophic vaginitis: Secondary | ICD-10-CM

## 2016-12-15 DIAGNOSIS — R3915 Urgency of urination: Secondary | ICD-10-CM | POA: Diagnosis not present

## 2016-12-15 DIAGNOSIS — N895 Stricture and atresia of vagina: Secondary | ICD-10-CM

## 2016-12-15 LAB — POCT URINALYSIS DIPSTICK
BILIRUBIN UA: NEGATIVE
GLUCOSE UA: NEGATIVE
KETONES UA: NEGATIVE
NITRITE UA: NEGATIVE
Protein, UA: NEGATIVE
Spec Grav, UA: <=1.005 — AB (ref 1.010–1.025)
Urobilinogen, UA: 0.2 E.U./dL
pH, UA: 6 (ref 5.0–8.0)

## 2016-12-15 NOTE — Patient Instructions (Signed)
1.  Begin using Premarin cream intravaginally twice a week 2.  Continue using dilator therapy as directed 3 times a week 3.  Return in May 2019 for annual exam and follow-up

## 2016-12-15 NOTE — Progress Notes (Signed)
Chief complaint: 1.  Vaginal atrophy 2.  Vaginal stenosis 3.  Dyspareunia 4.  Urinary urgency  Tracie Hall presents for follow-up.  She had stopped using Intrarosa vaginal suppositories approximately 3 weeks ago and she has been infrequently using the dilator therapy up to approximately once a week. Recent stressors include mom's passing last month. Concerns regarding being able to maintain intimacy were discussed and she and spouse are comfortable working through any and all issues.  She is contemplating the vaginal outlet release surgery which may be done in the spring of next year.  [name] does report some urinary urgency and wishes to rule out UTI.  Past medical history, past surgical history, problem list, medications, and allergies are reviewed.  OBJECTIVE: BP (!) 151/83   Pulse 81   Ht 5\' 6"  (1.676 m)   Wt 151 lb 14.4 oz (68.9 kg)   BMI 24.52 kg/m   Pleasant well-appearing female no acute distress.  Alert and oriented. Abdomen: Soft, nontender Pelvic exam: External genitalia-mild to moderate atrophy BUS-normal Vagina-vaginal outlet stenosis is persisted; index finger is inserted to the knuckle with good accommodation; pediatric speculum was used and demonstrates fair estrogen effect; vaginal dilators are utilized with a combination of size 3 and 4 and partial insertion of size 5 (size 5 dilator may be adequate to allow normal intercourse function) slight bleeding was noted with the size 5 dilator use. Bimanual-no palpable masses or obvious tenderness RV-perineal body short  ASSESSMENT: 1.  Vaginal atrophy, with fair estrogen effect with topical estrogen therapy 2.  Vaginal stenosis, still problematic preventing intercourse 3.  Urinary urgency, currently UTI  PLAN: 1.  Convert to Premarin cream intravaginally twice a week 2.  Discontinue Intrarosa suppositories as done 3.  Continue dilator 3 times a week with exercises as discussed 4.  UA with CNS to rule out UTI 5.  Return  May 2019 for annual exam and further management planning  A total of 25 minutes were spent face-to-face with the patient during this encounter and over half of that time involved counseling and coordination of care.  Brayton Mars, MD  Note: This dictation was prepared with Dragon dictation along with smaller phrase technology. Any transcriptional errors that result from this process are unintentional.

## 2016-12-15 NOTE — Addendum Note (Signed)
Addended by: Elouise Munroe on: 12/15/2016 11:39 AM   Modules accepted: Orders

## 2016-12-17 LAB — URINE CULTURE: Organism ID, Bacteria: NO GROWTH

## 2016-12-21 ENCOUNTER — Encounter: Payer: Self-pay | Admitting: Obstetrics and Gynecology

## 2016-12-31 ENCOUNTER — Encounter: Payer: Self-pay | Admitting: Adult Health

## 2016-12-31 ENCOUNTER — Ambulatory Visit: Payer: Self-pay | Admitting: Adult Health

## 2016-12-31 VITALS — BP 140/78 | HR 84 | Temp 98.6°F | Resp 16 | Ht 65.0 in | Wt 154.0 lb

## 2016-12-31 DIAGNOSIS — K047 Periapical abscess without sinus: Secondary | ICD-10-CM

## 2016-12-31 MED ORDER — AMOXICILLIN 875 MG PO TABS: 875.0000 mg | ORAL_TABLET | Freq: Two times a day (BID) | ORAL | 0 refills | Status: DC

## 2016-12-31 NOTE — Progress Notes (Signed)
Subjective:     Patient ID: Tracie Hall, female   DOB: [birth year], 61 y.o.   MRN: [id number]  HPI  61 year old female in no acute distress who feels she may have a abscessed tooth on her upper left molar.  She sees Tracie Hall and called his office but he is out sick today.   She reports she has had the same symptoms with a tooth on her right upper side side last year was abscessed - she had root canal.  Patient  denies any fever, chills, rash, chest pain, shortness of breath, nausea, vomiting, or diarrhea. She denies any other symptoms or complaints.   Blood pressure 140/78, pulse 84, temperature 98.6 F (37 C), resp. rate 16, height 5\' 5"  (1.651 m), weight 154 lb (69.9 kg), SpO2 98 %.  Current Outpatient Medications:  .  CALCIUM-VITAMIN D PO, Take 600 mg by mouth daily., Disp: , Rfl:  .  metoprolol succinate (TOPROL-XL) 25 MG 24 hr tablet, Take 1 tablet (25 mg total) by mouth daily., Disp: 90 tablet, Rfl: 3 .  Multiple Vitamin (MULTIVITAMIN) capsule, Take 1 capsule by mouth daily., Disp: , Rfl:  .  cyanocobalamin 2000 MCG tablet, Take 0.5 tablets (1,000 mcg total) by mouth daily. (Patient taking differently: Take 1,000 mcg by mouth daily. Is taking on Monday's, Wednesday's and Friday's), Disp: 1 tablet, Rfl: 1  Review of Systems  Constitutional: Negative.   HENT: Negative for dental problem (left upper back molar with tender gum ).   Eyes: Negative.   Respiratory: Negative.   Cardiovascular: Negative.   Gastrointestinal: Negative.   Genitourinary: Negative.   Musculoskeletal: Negative.   Neurological: Negative.   Psychiatric/Behavioral: Negative.        Objective:   Physical Exam  Constitutional: She is oriented to person, place, and time. She appears well-developed and well-nourished. No distress.  HENT:  Head: Normocephalic and atraumatic.  Right Ear: External ear normal.  Left Ear: External ear normal.  Nose: Nose normal. No mucosal edema or rhinorrhea. Right sinus  exhibits no maxillary sinus tenderness and no frontal sinus tenderness. Left sinus exhibits no maxillary sinus tenderness and no frontal sinus tenderness.  Mouth/Throat: Uvula is midline and oropharynx is clear and moist. No oropharyngeal exudate.    Left upper molar with mildly swollen inner gumline around tooth Marked on diagram.  No pus or drainage visualized.Tooth appears intact.  Neck: Normal range of motion. Neck supple. No JVD present. No tracheal deviation present. No thyromegaly present.  Cardiovascular: Normal rate, regular rhythm, normal heart sounds and intact distal pulses. Exam reveals no gallop and no friction rub.  No murmur heard. Pulmonary/Chest: Effort normal and breath sounds normal. No stridor. No respiratory distress. She has no wheezes. She has no rales. She exhibits no tenderness.  Abdominal: Soft. She exhibits no distension. There is no tenderness.  Musculoskeletal: Normal range of motion.  Lymphadenopathy:    She has no cervical adenopathy.  Neurological: She is alert and oriented to person, place, and time. She has normal reflexes.  Skin: Skin is warm and dry. No rash noted. She is not diaphoretic. No erythema. No pallor.  Psychiatric: She has a normal mood and affect. Her behavior is normal. Judgment and thought content normal.  Nursing note and vitals reviewed.      Assessment:     Tooth infection      Plan:     Meds ordered this encounter  Medications  . amoxicillin (AMOXIL) 875 MG tablet  Sig: Take 1 tablet (875 mg total) by mouth 2 (two) times daily.    Dispense:  20 tablet    Refill:  0   She will follow up with her dentist next week and go to the ER if symptoms worsen.   Advised to return to clinic for an appointment if no improvement within 72 hours or if any symptoms change or worsen. Advised ER or urgent Care if after hours or on weekend. 911 for emergency symptoms at any time.  Patient verbalized understanding of all instructions given  and denies any further questions at this time.

## 2016-12-31 NOTE — Patient Instructions (Signed)
Dental Pain Dental pain may be caused by many things, including:  Tooth decay (cavities or caries). Cavities cause the nerve of your tooth to be open to air and hot or cold temperatures. This can cause pain or discomfort.  Abscess or infection. A dental abscess is an area that is full of infected pus from a bacterial infection in the inner part of the tooth (pulp). It usually happens at the end of the tooth's root.  Injury.  An unknown reason (idiopathic).  Your pain may be mild or severe. It may only happen when:  You are chewing.  You are exposed to hot or cold temperature.  You are eating or drinking sugary foods or beverages, such as: ? Soda. ? Candy.  Your pain may also be there all of the time. Follow these instructions at home: Watch your dental pain for any changes. Do these things to lessen your discomfort:  Take medicines only as told by your dentist.  If your dentist tells you to take an antibiotic medicine, finish all of it even if you start to feel better.  Keep all follow-up visits as told by your dentist. This is important.  Do not apply heat to the outside of your face.  Rinse your mouth or gargle with salt water if told by your dentist. This helps with pain and swelling. ? You can make salt water by adding  tsp of salt to 1 cup of warm water.  Apply ice to the painful area of your face: ? Put ice in a plastic bag. ? Place a towel between your skin and the bag. ? Leave the ice on for 20 minutes, 2-3 times per day.  Avoid foods or drinks that cause you pain, such as: ? Very hot or very cold foods or drinks. ? Sweet or sugary foods or drinks.  Contact a doctor if:  Your pain is not helped with medicines.  Your symptoms are worse.  You have new symptoms. Get help right away if:  You cannot open your mouth.  You are having trouble breathing or swallowing.  You have a fever.  Your face, neck, or jaw is puffy (swollen). This information is not  intended to replace advice given to you by your health care provider. Make sure you discuss any questions you have with your health care provider. Document Released: 06/30/2007 Document Revised: 06/19/2015 Document Reviewed: 01/07/2014 Elsevier Interactive Patient Education  2018 [employer] Inc.  

## 2017-01-03 ENCOUNTER — Encounter: Payer: Self-pay | Admitting: Adult Health

## 2017-01-10 ENCOUNTER — Other Ambulatory Visit: Payer: Self-pay | Admitting: Obstetrics and Gynecology

## 2017-01-20 ENCOUNTER — Telehealth: Payer: Self-pay | Admitting: Obstetrics and Gynecology

## 2017-01-20 NOTE — Telephone Encounter (Signed)
Pt states on Christmas she had urinary urgency and nocturia. Not too bad now. Only slight pressure. Pt advised to drink h20 and cranberry juice. May try AZO. If sx persist she may drop off a urine sample tomorrow.

## 2017-01-20 NOTE — Telephone Encounter (Signed)
Patient called asking if she could drop off a urine specimen. She is experiencing burning irritation and urgency.

## 2017-01-21 ENCOUNTER — Other Ambulatory Visit: Payer: Self-pay

## 2017-01-21 ENCOUNTER — Other Ambulatory Visit (INDEPENDENT_AMBULATORY_CARE_PROVIDER_SITE_OTHER): Payer: BLUE CROSS/BLUE SHIELD

## 2017-01-21 ENCOUNTER — Other Ambulatory Visit: Payer: Self-pay | Admitting: Obstetrics and Gynecology

## 2017-01-21 DIAGNOSIS — R319 Hematuria, unspecified: Secondary | ICD-10-CM | POA: Diagnosis not present

## 2017-01-21 DIAGNOSIS — R300 Dysuria: Secondary | ICD-10-CM

## 2017-01-21 LAB — POCT URINALYSIS DIPSTICK
Bilirubin, UA: NEGATIVE
Glucose, UA: NEGATIVE
KETONES UA: NEGATIVE
NITRITE UA: NEGATIVE
Odor: POSITIVE
PH UA: 6.5 (ref 5.0–8.0)
PROTEIN UA: NEGATIVE
UROBILINOGEN UA: 0.2 U/dL

## 2017-01-21 MED ORDER — NITROFURANTOIN MONOHYD MACRO 100 MG PO CAPS: 100.0000 mg | ORAL_CAPSULE | Freq: Two times a day (BID) | ORAL | 0 refills | Status: DC

## 2017-01-23 LAB — URINE CULTURE

## 2017-01-26 ENCOUNTER — Encounter: Payer: BLUE CROSS/BLUE SHIELD | Admitting: Obstetrics and Gynecology

## 2017-04-13 ENCOUNTER — Other Ambulatory Visit: Payer: Self-pay | Admitting: Obstetrics and Gynecology

## 2017-04-13 ENCOUNTER — Ambulatory Visit
Admission: RE | Admit: 2017-04-13 | Discharge: 2017-04-13 | Disposition: A | Discharge status: Home / Self Care | Payer: BLUE CROSS/BLUE SHIELD | Source: Ambulatory Visit | Attending: Obstetrics and Gynecology | Admitting: Obstetrics and Gynecology

## 2017-04-13 DIAGNOSIS — R922 Inconclusive mammogram: Secondary | ICD-10-CM | POA: Diagnosis not present

## 2017-04-13 DIAGNOSIS — N6320 Unspecified lump in the left breast, unspecified quadrant: Secondary | ICD-10-CM

## 2017-04-13 DIAGNOSIS — N6322 Unspecified lump in the left breast, upper inner quadrant: Secondary | ICD-10-CM | POA: Diagnosis not present

## 2017-06-02 ENCOUNTER — Encounter: Payer: BLUE CROSS/BLUE SHIELD | Admitting: Obstetrics and Gynecology
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
## 2017-06-15 ENCOUNTER — Ambulatory Visit (INDEPENDENT_AMBULATORY_CARE_PROVIDER_SITE_OTHER): Payer: BLUE CROSS/BLUE SHIELD | Admitting: Obstetrics and Gynecology

## 2017-06-15 ENCOUNTER — Encounter: Payer: Self-pay | Admitting: Obstetrics and Gynecology

## 2017-06-15 VITALS — BP 171/75 | HR 89 | Ht 65.0 in | Wt 146.7 lb

## 2017-06-15 DIAGNOSIS — Z1211 Encounter for screening for malignant neoplasm of colon: Secondary | ICD-10-CM

## 2017-06-15 DIAGNOSIS — Z01419 Encounter for gynecological examination (general) (routine) without abnormal findings: Secondary | ICD-10-CM

## 2017-06-15 DIAGNOSIS — Z1239 Encounter for other screening for malignant neoplasm of breast: Secondary | ICD-10-CM

## 2017-06-15 DIAGNOSIS — Z780 Asymptomatic menopausal state: Secondary | ICD-10-CM | POA: Diagnosis not present

## 2017-06-15 DIAGNOSIS — N895 Stricture and atresia of vagina: Secondary | ICD-10-CM | POA: Diagnosis not present

## 2017-06-15 DIAGNOSIS — N952 Postmenopausal atrophic vaginitis: Secondary | ICD-10-CM

## 2017-06-15 DIAGNOSIS — Z1231 Encounter for screening mammogram for malignant neoplasm of breast: Secondary | ICD-10-CM

## 2017-06-15 NOTE — Patient Instructions (Signed)
1.  No Pap smear is done today. 2.  Mammogram is ordered 3.  Stool guaiac card testing is given for colon cancer screening 4.  Screening labs are to be obtained through primary care 5.  Continue with healthy eating and exercise. 6.  Continue with calcium and vitamin D supplementation twice a day 7.  Strongly encourage use of Premarin cream 1/2 g intravaginal to be used at the introitus, followed by vaginal dilator therapy as discussed 8.  Return in 3 months for follow-up.  Bring dilators at that time. 9.  Return in 1 year for annual exam   Health Maintenance for Postmenopausal Women Menopause is a normal process in which your reproductive ability comes to an end. This process happens gradually over a span of months to years, usually between the ages of 43 and 53. Menopause is complete when you have missed 12 consecutive menstrual periods. It is important to talk with your health care provider about some of the most common conditions that affect postmenopausal women, such as heart disease, cancer, and bone loss (osteoporosis). Adopting a healthy lifestyle and getting preventive care can help to promote your health and wellness. Those actions can also lower your chances of developing some of these common conditions. What should I know about menopause? During menopause, you may experience a number of symptoms, such as:  Moderate-to-severe hot flashes.  Night sweats.  Decrease in sex drive.  Mood swings.  Headaches.  Tiredness.  Irritability.  Memory problems.  Insomnia.  Choosing to treat or not to treat menopausal changes is an individual decision that you make with your health care provider. What should I know about hormone replacement therapy and supplements? Hormone therapy products are effective for treating symptoms that are associated with menopause, such as hot flashes and night sweats. Hormone replacement carries certain risks, especially as you become older. If you are  thinking about using estrogen or estrogen with progestin treatments, discuss the benefits and risks with your health care provider. What should I know about heart disease and stroke? Heart disease, heart attack, and stroke become more likely as you age. This may be due, in part, to the hormonal changes that your body experiences during menopause. These can affect how your body processes dietary fats, triglycerides, and cholesterol. Heart attack and stroke are both medical emergencies. There are many things that you can do to help prevent heart disease and stroke:  Have your blood pressure checked at least every 1-2 years. High blood pressure causes heart disease and increases the risk of stroke.  If you are 31-37 years old, ask your health care provider if you should take aspirin to prevent a heart attack or a stroke.  Do not use any tobacco products, including cigarettes, chewing tobacco, or electronic cigarettes. If you need help quitting, ask your health care provider.  It is important to eat a healthy diet and maintain a healthy weight. ? Be sure to include plenty of vegetables, fruits, low-fat dairy products, and lean protein. ? Avoid eating foods that are high in solid fats, added sugars, or salt (sodium).  Get regular exercise. This is one of the most important things that you can do for your health. ? Try to exercise for at least 150 minutes each week. The type of exercise that you do should increase your heart rate and make you sweat. This is known as moderate-intensity exercise. ? Try to do strengthening exercises at least twice each week. Do these in addition to the moderate-intensity  exercise.  Know your numbers.Ask your health care provider to check your cholesterol and your blood glucose. Continue to have your blood tested as directed by your health care provider.  What should I know about cancer screening? There are several types of cancer. Take the following steps to reduce  your risk and to catch any cancer development as early as possible. Breast Cancer  Practice breast self-awareness. ? This means understanding how your breasts normally appear and feel. ? It also means doing regular breast self-exams. Let your health care provider know about any changes, no matter how small.  If you are 82 or older, have a clinician do a breast exam (clinical breast exam or CBE) every year. Depending on your age, family history, and medical history, it may be recommended that you also have a yearly breast X-ray (mammogram).  If you have a family history of breast cancer, talk with your health care provider about genetic screening.  If you are at high risk for breast cancer, talk with your health care provider about having an MRI and a mammogram every year.  Breast cancer (BRCA) gene test is recommended for women who have family members with BRCA-related cancers. Results of the assessment will determine the need for genetic counseling and BRCA1 and for BRCA2 testing. BRCA-related cancers include these types: ? Breast. This occurs in males or females. ? Ovarian. ? Tubal. This may also be called fallopian tube cancer. ? Cancer of the abdominal or pelvic lining (peritoneal cancer). ? Prostate. ? Pancreatic.  Cervical, Uterine, and Ovarian Cancer Your health care provider may recommend that you be screened regularly for cancer of the pelvic organs. These include your ovaries, uterus, and vagina. This screening involves a pelvic exam, which includes checking for microscopic changes to the surface of your cervix (Pap test).  For women ages 21-65, health care providers may recommend a pelvic exam and a Pap test every three years. For women ages 86-65, they may recommend the Pap test and pelvic exam, combined with testing for human papilloma virus (HPV), every five years. Some types of HPV increase your risk of cervical cancer. Testing for HPV may also be done on women of any age who  have unclear Pap test results.  Other health care providers may not recommend any screening for nonpregnant women who are considered low risk for pelvic cancer and have no symptoms. Ask your health care provider if a screening pelvic exam is right for you.  If you have had past treatment for cervical cancer or a condition that could lead to cancer, you need Pap tests and screening for cancer for at least 20 years after your treatment. If Pap tests have been discontinued for you, your risk factors (such as having a new sexual partner) need to be reassessed to determine if you should start having screenings again. Some women have medical problems that increase the chance of getting cervical cancer. In these cases, your health care provider may recommend that you have screening and Pap tests more often.  If you have a family history of uterine cancer or ovarian cancer, talk with your health care provider about genetic screening.  If you have vaginal bleeding after reaching menopause, tell your health care provider.  There are currently no reliable tests available to screen for ovarian cancer.  Lung Cancer Lung cancer screening is recommended for adults 78-47 years old who are at high risk for lung cancer because of a history of smoking. A yearly low-dose CT scan  of the lungs is recommended if you:  Currently smoke.  Have a history of at least 30 pack-years of smoking and you currently smoke or have quit within the past 15 years. A pack-year is smoking an average of one pack of cigarettes per day for one year.  Yearly screening should:  Continue until it has been 15 years since you quit.  Stop if you develop a health problem that would prevent you from having lung cancer treatment.  Colorectal Cancer  This type of cancer can be detected and can often be prevented.  Routine colorectal cancer screening usually begins at age 62 and continues through age 63.  If you have risk factors for colon  cancer, your health care provider may recommend that you be screened at an earlier age.  If you have a family history of colorectal cancer, talk with your health care provider about genetic screening.  Your health care provider may also recommend using home test kits to check for hidden blood in your stool.  A small camera at the end of a tube can be used to examine your colon directly (sigmoidoscopy or colonoscopy). This is done to check for the earliest forms of colorectal cancer.  Direct examination of the colon should be repeated every 5-10 years until age 43. However, if early forms of precancerous polyps or small growths are found or if you have a family history or genetic risk for colorectal cancer, you may need to be screened more often.  Skin Cancer  Check your skin from head to toe regularly.  Monitor any moles. Be sure to tell your health care provider: ? About any new moles or changes in moles, especially if there is a change in a mole's shape or color. ? If you have a mole that is larger than the size of a pencil eraser.  If any of your family members has a history of skin cancer, especially at a young age, talk with your health care provider about genetic screening.  Always use sunscreen. Apply sunscreen liberally and repeatedly throughout the day.  Whenever you are outside, protect yourself by wearing long sleeves, pants, a wide-brimmed hat, and sunglasses.  What should I know about osteoporosis? Osteoporosis is a condition in which bone destruction happens more quickly than new bone creation. After menopause, you may be at an increased risk for osteoporosis. To help prevent osteoporosis or the bone fractures that can happen because of osteoporosis, the following is recommended:  If you are 67-44 years old, get at least 1,000 mg of calcium and at least 600 mg of vitamin D per day.  If you are older than age 8 but younger than age 27, get at least 1,200 mg of calcium and  at least 600 mg of vitamin D per day.  If you are older than age 84, get at least 1,200 mg of calcium and at least 800 mg of vitamin D per day.  Smoking and excessive alcohol intake increase the risk of osteoporosis. Eat foods that are rich in calcium and vitamin D, and do weight-bearing exercises several times each week as directed by your health care provider. What should I know about how menopause affects my mental health? Depression may occur at any age, but it is more common as you become older. Common symptoms of depression include:  Low or sad mood.  Changes in sleep patterns.  Changes in appetite or eating patterns.  Feeling an overall lack of motivation or enjoyment of activities that  you previously enjoyed.  Frequent crying spells.  Talk with your health care provider if you think that you are experiencing depression. What should I know about immunizations? It is important that you get and maintain your immunizations. These include:  Tetanus, diphtheria, and pertussis (Tdap) booster vaccine.  Influenza every year before the flu season begins.  Pneumonia vaccine.  Shingles vaccine.  Your health care provider may also recommend other immunizations. This information is not intended to replace advice given to you by your health care provider. Make sure you discuss any questions you have with your health care provider. Document Released: 03/05/2005 Document Revised: 08/01/2015 Document Reviewed: 10/15/2014 Elsevier Interactive Patient Education  2018 [employer].

## 2017-06-28 DIAGNOSIS — Z1211 Encounter for screening for malignant neoplasm of colon: Secondary | ICD-10-CM | POA: Diagnosis not present

## 2017-06-30 LAB — FECAL OCCULT BLOOD, IMMUNOCHEMICAL: FECAL OCCULT BLD: NEGATIVE

## 2017-09-29 ENCOUNTER — Encounter: Payer: BLUE CROSS/BLUE SHIELD | Admitting: Obstetrics and Gynecology

## 2017-10-18 ENCOUNTER — Other Ambulatory Visit: Payer: Self-pay | Admitting: Family Medicine

## 2017-10-18 ENCOUNTER — Ambulatory Visit
Admission: RE | Admit: 2017-10-18 | Discharge: 2017-10-18 | Disposition: A | Discharge status: Home / Self Care | Payer: BLUE CROSS/BLUE SHIELD | Source: Ambulatory Visit | Attending: Obstetrics and Gynecology | Admitting: Obstetrics and Gynecology

## 2017-10-18 DIAGNOSIS — N6322 Unspecified lump in the left breast, upper inner quadrant: Secondary | ICD-10-CM | POA: Diagnosis not present

## 2017-10-18 DIAGNOSIS — I10 Essential (primary) hypertension: Secondary | ICD-10-CM

## 2017-10-18 DIAGNOSIS — N6320 Unspecified lump in the left breast, unspecified quadrant: Secondary | ICD-10-CM

## 2017-10-18 DIAGNOSIS — R922 Inconclusive mammogram: Secondary | ICD-10-CM | POA: Diagnosis not present

## 2017-10-20 ENCOUNTER — Ambulatory Visit (INDEPENDENT_AMBULATORY_CARE_PROVIDER_SITE_OTHER): Payer: BLUE CROSS/BLUE SHIELD | Admitting: Family Medicine

## 2017-10-20 ENCOUNTER — Encounter: Payer: Self-pay | Admitting: Family Medicine

## 2017-10-20 VITALS — BP 120/70 | HR 78 | Temp 98.7°F | Resp 16 | Ht 65.0 in | Wt 142.0 lb

## 2017-10-20 DIAGNOSIS — Z0184 Encounter for antibody response examination: Secondary | ICD-10-CM | POA: Diagnosis not present

## 2017-10-20 DIAGNOSIS — E538 Deficiency of other specified B group vitamins: Secondary | ICD-10-CM

## 2017-10-20 DIAGNOSIS — Z0000 Encounter for general adult medical examination without abnormal findings: Secondary | ICD-10-CM

## 2017-10-20 DIAGNOSIS — E559 Vitamin D deficiency, unspecified: Secondary | ICD-10-CM | POA: Diagnosis not present

## 2017-10-20 DIAGNOSIS — Z23 Encounter for immunization: Secondary | ICD-10-CM

## 2017-10-20 DIAGNOSIS — I10 Essential (primary) hypertension: Secondary | ICD-10-CM

## 2017-10-20 DIAGNOSIS — I8393 Asymptomatic varicose veins of bilateral lower extremities: Secondary | ICD-10-CM

## 2017-10-20 DIAGNOSIS — R7303 Prediabetes: Secondary | ICD-10-CM | POA: Insufficient documentation

## 2017-10-20 DIAGNOSIS — R002 Palpitations: Secondary | ICD-10-CM
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
## 2017-10-20 NOTE — Patient Instructions (Signed)
Preventive Care 40-64 Years, Female Preventive care refers to lifestyle choices and visits with your health care provider that can promote health and wellness. What does preventive care include?  A yearly physical exam. This is also called an annual well check.  Dental exams once or twice a year.  Routine eye exams. Ask your health care provider how often you should have your eyes checked.  Personal lifestyle choices, including: ? Daily care of your teeth and gums. ? Regular physical activity. ? Eating a healthy diet. ? Avoiding tobacco and drug use. ? Limiting alcohol use. ? Practicing safe sex. ? Taking low-dose aspirin daily starting at age 58. ? Taking vitamin and mineral supplements as recommended by your health care provider. What happens during an annual well check? The services and screenings done by your health care provider during your annual well check will depend on your age, overall health, lifestyle risk factors, and family history of disease. Counseling Your health care provider may ask you questions about your:  Alcohol use.  Tobacco use.  Drug use.  Emotional well-being.  Home and relationship well-being.  Sexual activity.  Eating habits.  Work and work Statistician.  Method of birth control.  Menstrual cycle.  Pregnancy history.  Screening You may have the following tests or measurements:  Height, weight, and BMI.  Blood pressure.  Lipid and cholesterol levels. These may be checked every 5 years, or more frequently if you are over 81 years old.  Skin check.  Lung cancer screening. You may have this screening every year starting at age 78 if you have a 30-pack-year history of smoking and currently smoke or have quit within the past 15 years.  Fecal occult blood test (FOBT) of the stool. You may have this test every year starting at age 65.  Flexible sigmoidoscopy or colonoscopy. You may have a sigmoidoscopy every 5 years or a colonoscopy  every 10 years starting at age 30.  Hepatitis C blood test.  Hepatitis B blood test.  Sexually transmitted disease (STD) testing.  Diabetes screening. This is done by checking your blood sugar (glucose) after you have not eaten for a while (fasting). You may have this done every 1-3 years.  Mammogram. This may be done every 1-2 years. Talk to your health care provider about when you should start having regular mammograms. This may depend on whether you have a family history of breast cancer.  BRCA-related cancer screening. This may be done if you have a family history of breast, ovarian, tubal, or peritoneal cancers.  Pelvic exam and Pap test. This may be done every 3 years starting at age 80. Starting at age 36, this may be done every 5 years if you have a Pap test in combination with an HPV test.  Bone density scan. This is done to screen for osteoporosis. You may have this scan if you are at high risk for osteoporosis.  Discuss your test results, treatment options, and if necessary, the need for more tests with your health care provider. Vaccines Your health care provider may recommend certain vaccines, such as:  Influenza vaccine. This is recommended every year.  Tetanus, diphtheria, and acellular pertussis (Tdap, Td) vaccine. You may need a Td booster every 10 years.  Varicella vaccine. You may need this if you have not been vaccinated.  Zoster vaccine. You may need this after age 5.  Measles, mumps, and rubella (MMR) vaccine. You may need at least one dose of MMR if you were born in  1957 or later. You may also need a second dose.  Pneumococcal 13-valent conjugate (PCV13) vaccine. You may need this if you have certain conditions and were not previously vaccinated.  Pneumococcal polysaccharide (PPSV23) vaccine. You may need one or two doses if you smoke cigarettes or if you have certain conditions.  Meningococcal vaccine. You may need this if you have certain  conditions.  Hepatitis A vaccine. You may need this if you have certain conditions or if you travel or work in places where you may be exposed to hepatitis A.  Hepatitis B vaccine. You may need this if you have certain conditions or if you travel or work in places where you may be exposed to hepatitis B.  Haemophilus influenzae type b (Hib) vaccine. You may need this if you have certain conditions.  Talk to your health care provider about which screenings and vaccines you need and how often you need them. This information is not intended to replace advice given to you by your health care provider. Make sure you discuss any questions you have with your health care provider. Document Released: 02/07/2015 Document Revised: 10/01/2015 Document Reviewed: 11/12/2014 Elsevier Interactive Patient Education  2018 Elsevier Inc.  

## 2017-11-01 ENCOUNTER — Other Ambulatory Visit: Payer: Self-pay

## 2017-11-01 DIAGNOSIS — E538 Deficiency of other specified B group vitamins: Secondary | ICD-10-CM

## 2017-11-01 DIAGNOSIS — E559 Vitamin D deficiency, unspecified: Secondary | ICD-10-CM

## 2017-11-01 DIAGNOSIS — Z0184 Encounter for antibody response examination: Secondary | ICD-10-CM

## 2017-11-01 DIAGNOSIS — I10 Essential (primary) hypertension: Secondary | ICD-10-CM

## 2017-11-01 DIAGNOSIS — R7303 Prediabetes: Secondary | ICD-10-CM

## 2017-11-01 DIAGNOSIS — Z0000 Encounter for general adult medical examination without abnormal findings: Secondary | ICD-10-CM

## 2017-11-01 DIAGNOSIS — R002 Palpitations: Secondary | ICD-10-CM

## 2017-11-02 LAB — LIPID PANEL
CHOLESTEROL TOTAL: 207 mg/dL — AB (ref 100–199)
Chol/HDL Ratio: 2.4 ratio (ref 0.0–4.4)
HDL: 85 mg/dL (ref >39)
LDL CALC: 109 mg/dL — AB (ref 0–99)
Triglycerides: 65 mg/dL (ref 0–149)
VLDL Cholesterol Cal: 13 mg/dL (ref 5–40)

## 2017-11-02 LAB — COMPREHENSIVE METABOLIC PANEL
ALT: 26 IU/L (ref 0–32)
AST: 27 IU/L (ref 0–40)
Albumin/Globulin Ratio: 2.1 (ref 1.2–2.2)
Albumin: 4.8 g/dL (ref 3.6–4.8)
Alkaline Phosphatase: 77 IU/L (ref 39–117)
BUN/Creatinine Ratio: 20 (ref 12–28)
BUN: 17 mg/dL (ref 8–27)
Bilirubin Total: 0.5 mg/dL (ref 0.0–1.2)
CALCIUM: 10 mg/dL (ref 8.7–10.3)
CO2: 22 mmol/L (ref 20–29)
CREATININE: 0.83 mg/dL (ref 0.57–1.00)
Chloride: 100 mmol/L (ref 96–106)
GFR calc Af Amer: 87 mL/min/{1.73_m2} (ref >59)
GFR, EST NON AFRICAN AMERICAN: 75 mL/min/{1.73_m2} (ref >59)
GLOBULIN, TOTAL: 2.3 g/dL (ref 1.5–4.5)
GLUCOSE: 102 mg/dL — AB (ref 65–99)
Potassium: 4.5 mmol/L (ref 3.5–5.2)
SODIUM: 141 mmol/L (ref 134–144)
Total Protein: 7.1 g/dL (ref 6.0–8.5)

## 2017-11-02 LAB — MEASLES/MUMPS/RUBELLA IMMUNITY
MUMPS ABS, IGG: 187 AU/mL (ref >10.9)
Rubella Antibodies, IGG: 14.7 index (ref >0.99)

## 2017-11-02 LAB — TSH: TSH: 2.71 u[IU]/mL (ref 0.450–4.500)

## 2017-11-02 LAB — CBC WITH DIFFERENTIAL/PLATELET
BASOS: 1 %
Basophils Absolute: 0 10*3/uL (ref 0.0–0.2)
EOS (ABSOLUTE): 0.1 10*3/uL (ref 0.0–0.4)
EOS: 2 %
Hematocrit: 40.3 % (ref 34.0–46.6)
Hemoglobin: 13.4 g/dL (ref 11.1–15.9)
IMMATURE GRANULOCYTES: 0 %
Immature Grans (Abs): 0 10*3/uL (ref 0.0–0.1)
LYMPHS: 22 %
Lymphocytes Absolute: 1.1 10*3/uL (ref 0.7–3.1)
MCH: 29.6 pg (ref 26.6–33.0)
MCHC: 33.3 g/dL (ref 31.5–35.7)
MCV: 89 fL (ref 79–97)
MONOS ABS: 0.4 10*3/uL (ref 0.1–0.9)
Monocytes: 7 %
NEUTROS PCT: 68 %
Neutrophils Absolute: 3.4 10*3/uL (ref 1.4–7.0)
PLATELETS: 222 10*3/uL (ref 150–450)
RBC: 4.52 x10E6/uL (ref 3.77–5.28)
RDW: 13.4 % (ref 12.3–15.4)
WBC: 5 10*3/uL (ref 3.4–10.8)

## 2017-11-02 LAB — HGB A1C W/O EAG: Hgb A1c MFr Bld: 5.7 % — ABNORMAL HIGH (ref 4.8–5.6)

## 2017-11-02 LAB — VITAMIN B12: Vitamin B-12: 412 pg/mL (ref 232–1245)

## 2017-11-02 LAB — VITAMIN D 25 HYDROXY (VIT D DEFICIENCY, FRACTURES): VIT D 25 HYDROXY: 39.4 ng/mL (ref 30.0–100.0)

## 2017-11-04 ENCOUNTER — Encounter: Payer: Self-pay | Admitting: Family Medicine

## 2017-11-07 ENCOUNTER — Encounter (INDEPENDENT_AMBULATORY_CARE_PROVIDER_SITE_OTHER): Payer: Self-pay | Admitting: Vascular Surgery

## 2017-11-07 ENCOUNTER — Ambulatory Visit (INDEPENDENT_AMBULATORY_CARE_PROVIDER_SITE_OTHER): Payer: BLUE CROSS/BLUE SHIELD | Admitting: Vascular Surgery

## 2017-11-07 VITALS — BP 147/83 | HR 86 | Resp 16 | Ht 65.0 in | Wt 141.0 lb

## 2017-11-07 DIAGNOSIS — I10 Essential (primary) hypertension: Secondary | ICD-10-CM

## 2017-11-07 DIAGNOSIS — I8393 Asymptomatic varicose veins of bilateral lower extremities: Secondary | ICD-10-CM | POA: Diagnosis not present

## 2017-11-07 DIAGNOSIS — R7303 Prediabetes: Secondary | ICD-10-CM
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
## 2017-11-28 DIAGNOSIS — D2230 Melanocytic nevi of unspecified part of face: Secondary | ICD-10-CM | POA: Diagnosis not present

## 2017-11-28 DIAGNOSIS — D485 Neoplasm of uncertain behavior of skin: Secondary | ICD-10-CM | POA: Diagnosis not present

## 2017-11-28 DIAGNOSIS — D225 Melanocytic nevi of trunk: Secondary | ICD-10-CM | POA: Diagnosis not present

## 2017-11-28 DIAGNOSIS — Z1283 Encounter for screening for malignant neoplasm of skin: Secondary | ICD-10-CM | POA: Diagnosis not present

## 2017-12-01 ENCOUNTER — Encounter: Payer: Self-pay | Admitting: Adult Health

## 2017-12-01 ENCOUNTER — Ambulatory Visit: Payer: BLUE CROSS/BLUE SHIELD | Admitting: Adult Health

## 2017-12-01 VITALS — BP 140/75 | HR 76 | Temp 97.7°F | Resp 16 | Ht 67.0 in | Wt 140.0 lb

## 2017-12-01 DIAGNOSIS — J029 Acute pharyngitis, unspecified: Secondary | ICD-10-CM

## 2017-12-01 DIAGNOSIS — T7840XA Allergy, unspecified, initial encounter: Secondary | ICD-10-CM

## 2017-12-01 DIAGNOSIS — H6501 Acute serous otitis media, right ear: Secondary | ICD-10-CM

## 2017-12-01 MED ORDER — AMOXICILLIN 875 MG PO TABS: 875.0000 mg | ORAL_TABLET | Freq: Two times a day (BID) | ORAL | 0 refills | Status: DC

## 2017-12-01 NOTE — Patient Instructions (Signed)
Nasal Allergies Nasal allergies are a reaction to allergens in the air. Allergens are tiny specks (particles) in the air that cause your body to have an allergic reaction. Nasal allergies are not passed from person to person (contagious). They cannot be cured, but they can be controlled. Common causes of nasal allergies include:  Pollen from grasses, trees, and weeds.  House dust mites.  Pet dander.  Mold.  Follow these instructions at home:  Avoid the allergen that is causing your symptoms, if you can.  Keep windows closed. If possible, use air conditioning when there is a lot of pollen in the air.  Do not use fans in your home.  Do not hang clothes outside to dry.  Wear sunglasses to keep pollen out of your eyes.  Wash your hands right away after you touch household pets.  Take over-the-counter and prescription medicines only as told by your doctor.  Keep all follow-up visits as told by your doctor. This is important. Contact a doctor if:  You have a fever.  You have a cough that does not go away (is persistent).  You start to make whistling sounds when you breathe (wheeze).  Your symptoms do not get better with treatment.  You have thick fluid coming from your nose.  You start to have nosebleeds. Get help right away if:  Your tongue or your lips are swollen.  You have trouble breathing.  You feel light-headed or you feel like you are going to pass out (faint).  You have cold sweats. This information is not intended to replace advice given to you by your health care provider. Make sure you discuss any questions you have with your health care provider. Document Released: 05/13/2010 Document Revised: 06/19/2015 Document Reviewed: 07/24/2014 Elsevier Interactive Patient Education  2018 [employer]. Loratadine tablets What is this medicine? LORATADINE (lor AT a deen) is an antihistamine. It helps to relieve sneezing, runny nose, and itchy, watery eyes. This  medicine is used to treat the symptoms of allergies. It is also used to treat itchy skin rash and hives. This medicine may be used for other purposes; ask your health care provider or pharmacist if you have questions. COMMON BRAND NAME(S): Alavert, Allergy Relief, Claritin, Claritin Hives Relief, Clear-Atadine, QlearQuil All Day & All Night Allergy Relief, Tavist ND What should I tell my health care provider before I take this medicine? They need to know if you have any of these conditions: -asthma -kidney disease -liver disease -an unusual or allergic reaction to loratadine, other antihistamines, other medicines, foods, dyes, or preservatives -pregnant or trying to get pregnant -breast-feeding How should I use this medicine? Take this medicine by mouth with a glass of water. Follow the directions on the label. You may take this medicine with food or on an empty stomach. Take your medicine at regular intervals. Do not take your medicine more often than directed. Talk to your pediatrician regarding the use of this medicine in children. While this medicine may be used in children as young as 6 years for selected conditions, precautions do apply. Overdosage: If you think you have taken too much of this medicine contact a poison control center or emergency room at once. NOTE: This medicine is only for you. Do not share this medicine with others. What if I miss a dose? If you miss a dose, take it as soon as you can. If it is almost time for your next dose, take only that dose. Do not take double or extra  doses. What may interact with this medicine? -other medicines for colds or allergies This list may not describe all possible interactions. Give your health care provider a list of all the medicines, herbs, non-prescription drugs, or dietary supplements you use. Also tell them if you smoke, drink alcohol, or use illegal drugs. Some items may interact with your medicine. What should I watch for while  using this medicine? Tell your doctor or healthcare professional if your symptoms do not start to get better or if they get worse. Your mouth may get dry. Chewing sugarless gum or sucking hard candy, and drinking plenty of water may help. Contact your doctor if the problem does not go away or is severe. You may get drowsy or dizzy. Do not drive, use machinery, or do anything that needs mental alertness until you know how this medicine affects you. Do not stand or sit up quickly, especially if you are an older patient. This reduces the risk of dizzy or fainting spells. What side effects may I notice from receiving this medicine? Side effects that you should report to your doctor or health care professional as soon as possible: -allergic reactions like skin rash, itching or hives, swelling of the face, lips, or tongue -breathing problems -unusually restless or nervous Side effects that usually do not require medical attention (report to your doctor or health care professional if they continue or are bothersome): -drowsiness -dry or irritated mouth or throat -headache This list may not describe all possible side effects. Call your doctor for medical advice about side effects. You may report side effects to FDA at 1-800-FDA-1088. Where should I keep my medicine? Keep out of the reach of children. Store at room temperature between 2 and 30 degrees C (36 and 86 degrees F). Protect from moisture. Throw away any unused medicine after the expiration date. NOTE: This sheet is a summary. It may not cover all possible information. If you have questions about this medicine, talk to your doctor, pharmacist, or health care provider.  2018 Elsevier/Gold Standard (2007-07-17 17:17:24) Pharyngitis Pharyngitis is a sore throat (pharynx). There is redness, pain, and swelling of your throat. Follow these instructions at home:  Drink enough fluids to keep your pee (urine) clear or pale yellow.  Only take  medicine as told by your doctor. ? You may get sick again if you do not take medicine as told. Finish your medicines, even if you start to feel better. ? Do not take aspirin.  Rest.  Rinse your mouth (gargle) with salt water ( tsp of salt per 1 qt of water) every 1-2 hours. This will help the pain.  If you are not at risk for choking, you can suck on hard candy or sore throat lozenges. Contact a doctor if:  You have large, tender lumps on your neck.  You have a rash.  You cough up green, yellow-brown, or bloody spit. Get help right away if:  You have a stiff neck.  You drool or cannot swallow liquids.  You throw up (vomit) or are not able to keep medicine or liquids down.  You have very bad pain that does not go away with medicine.  You have problems breathing (not from a stuffy nose). This information is not intended to replace advice given to you by your health care provider. Make sure you discuss any questions you have with your health care provider. Document Released: 06/30/2007 Document Revised: 06/19/2015 Document Reviewed: 09/18/2012 Elsevier Interactive Patient Education  2017 [employer]. Otitis  Media, Adult Otitis media is redness, soreness, and puffiness (swelling) in the space just behind your eardrum (middle ear). It may be caused by allergies or infection. It often happens along with a cold. Follow these instructions at home:  Take your medicine as told. Finish it even if you start to feel better.  Only take over-the-counter or prescription medicines for pain, discomfort, or fever as told by your doctor.  Follow up with your doctor as told. Contact a doctor if:  You have otitis media only in one ear, or bleeding from your nose, or both.  You notice a lump on your neck.  You are not getting better in 3-5 days.  You feel worse instead of better. Get help right away if:  You have pain that is not helped with medicine.  You have puffiness, redness, or  pain around your ear.  You get a stiff neck.  You cannot move part of your face (paralysis).  You notice that the bone behind your ear hurts when you touch it. This information is not intended to replace advice given to you by your health care provider. Make sure you discuss any questions you have with your health care provider. Document Released: 06/30/2007 Document Revised: 06/19/2015 Document Reviewed: 08/08/2012 Elsevier Interactive Patient Education  2017 [employer].

## 2017-12-01 NOTE — Progress Notes (Signed)
Subjective:     Patient ID: Tracie Hall, female   DOB: December 26, 1955, 62 y.o.   MRN: [id number]  Blood pressure 140/75, pulse 76, temperature 97.7 F (36.5 C), resp. rate 16, height 5\' 7"  (1.702 m), weight 140 lb (63.5 kg), SpO2 100 %.  Patient is a 62 year old female in no acute distress who comes to the clinic with a complaint of sore throat x 2 weeks.  She had cold symptoms two weeks ago that resolved but sore throat has persists.  Patient  denies any fever, body aches,chills, rash, chest pain, shortness of breath, nausea, vomiting, or diarrhea.   Denies any ill exposures or recent illness or hospitalizations.   Sore Throat   This is a new problem. The current episode started 1 to 4 weeks ago (2 weeks). The problem has been gradually worsening (sorethraot only - pther symptoms have resolved). Neither side of throat is experiencing more pain than the other. There has been no fever. The pain is at a severity of 4/10. The pain is mild. Associated symptoms include ear pain. Pertinent negatives include no abdominal pain, congestion, coughing, diarrhea, drooling, ear discharge, headaches, hoarse voice, plugged ear sensation, neck pain, shortness of breath, stridor, swollen glands, trouble swallowing or vomiting. She has had no exposure to strep or mono. Treatments tried: coricidin  OTC  The treatment provided mild relief.   No Known Allergies     Review of Systems  Constitutional: Negative.   HENT: Positive for ear pain, rhinorrhea and sore throat. Negative for congestion, dental problem, drooling, ear discharge, facial swelling, hearing loss, hoarse voice, mouth sores, nosebleeds, postnasal drip, sinus pressure, sinus pain, sneezing, tinnitus, trouble swallowing and voice change.   Eyes: Negative.   Respiratory: Negative.  Negative for apnea, cough, choking, chest tightness, shortness of breath, wheezing and stridor.   Cardiovascular: Negative.   Gastrointestinal: Negative.  Negative for  abdominal pain, diarrhea and vomiting.  Endocrine: Negative.   Genitourinary: Negative.   Musculoskeletal: Negative.  Negative for neck pain.  Skin: Negative.   Neurological: Negative.  Negative for headaches.  Hematological: Negative.   Psychiatric/Behavioral: Negative.        Objective:   Physical Exam  Constitutional: She is oriented to person, place, and time. She appears well-developed and well-nourished. She is active.  Non-toxic appearance. She does not appear ill. No distress.  HENT:  Head: Normocephalic and atraumatic.  Right Ear: Hearing, external ear and ear canal normal. No drainage, swelling or tenderness. Tympanic membrane is not perforated and not erythematous. A middle ear effusion is present.  Left Ear: Hearing, external ear and ear canal normal. No drainage, swelling or tenderness. Tympanic membrane is not perforated and not erythematous. A middle ear effusion (darker brown fluid behind intact tympanic membrane ) is present.  Nose: No mucosal edema or rhinorrhea. Right sinus exhibits no maxillary sinus tenderness and no frontal sinus tenderness. Left sinus exhibits no maxillary sinus tenderness and no frontal sinus tenderness.  Mouth/Throat: Uvula is midline and mucous membranes are normal. No oral lesions. No uvula swelling. Posterior oropharyngeal erythema present. No oropharyngeal exudate, posterior oropharyngeal edema or tonsillar abscesses. Tonsils are 0 on the right. Tonsils are 0 on the left.  Eyes: Pupils are equal, round, and reactive to light. EOM are normal.  Neck: Normal range of motion. Neck supple.  Cardiovascular: Normal rate, regular rhythm, normal heart sounds and intact distal pulses. Exam reveals no gallop and no friction rub.  No murmur heard. Pulmonary/Chest: Effort normal and  breath sounds normal. No stridor. She has no wheezes. She has no rhonchi. She has no rales. She exhibits no tenderness.  Abdominal: Soft.  Lymphadenopathy:       Head (right  side): No submental, no submandibular, no tonsillar, no preauricular, no posterior auricular and no occipital adenopathy present.       Head (left side): No submental, no submandibular, no tonsillar, no preauricular, no posterior auricular and no occipital adenopathy present.    She has cervical adenopathy.       Right cervical: Superficial cervical adenopathy present. No deep cervical and no posterior cervical adenopathy present.      Left cervical: Superficial cervical (mild bilateral) adenopathy present. No deep cervical and no posterior cervical adenopathy present.  Neurological: She is alert and oriented to person, place, and time. She has normal strength.  Skin: Skin is warm, dry and intact. Capillary refill takes less than 2 seconds. No rash noted. Nails show no clubbing.  Psychiatric: She has a normal mood and affect. Her behavior is normal.  Vitals reviewed.      Assessment:  Non-recurrent acute serous otitis media of right ear  Pharyngitis, unspecified etiology  Allergic state, initial encounter      Plan:     Meds ordered this encounter  Medications  . amoxicillin (AMOXIL) 875 MG tablet    Sig: Take 1 tablet (875 mg total) by mouth 2 (two) times daily.    Dispense:  20 tablet    Refill:  0   Recommend Flonase and Claritin daily per package instructions as well. .   Advised patient call the office or your primary care doctor for an appointment if no improvement within 72 hours or if any symptoms change or worsen at any time  Advised ER or urgent Care if after hours or on weekend. Call 911 for emergency symptoms at any time.Patinet verbalized understanding of all instructions given/reviewed and treatment plan and has no further questions or concerns at this time.

## 2017-12-20 ENCOUNTER — Ambulatory Visit: Payer: BLUE CROSS/BLUE SHIELD | Admitting: Cardiovascular Disease

## 2017-12-20 ENCOUNTER — Encounter: Payer: Self-pay | Admitting: Cardiovascular Disease

## 2017-12-20 VITALS — BP 140/80 | HR 76 | Ht 65.0 in | Wt 138.0 lb

## 2017-12-20 DIAGNOSIS — R7309 Other abnormal glucose: Secondary | ICD-10-CM

## 2017-12-20 DIAGNOSIS — I341 Nonrheumatic mitral (valve) prolapse: Secondary | ICD-10-CM

## 2017-12-20 DIAGNOSIS — I10 Essential (primary) hypertension: Secondary | ICD-10-CM

## 2017-12-20 DIAGNOSIS — R011 Cardiac murmur, unspecified: Secondary | ICD-10-CM

## 2017-12-20 DIAGNOSIS — R002 Palpitations: Secondary | ICD-10-CM | POA: Diagnosis not present

## 2017-12-20 NOTE — Patient Instructions (Addendum)
Medication Instructions:  No changes  If you need a refill on your cardiac medications before your next appointment, please call your pharmacy.    Lab work: No new labs needed   If you have labs (blood work) drawn today and your tests are completely normal, you will receive your results only by: Marland Kitchen MyChart Message (if you have MyChart) OR . A paper copy in the mail If you have any lab test that is abnormal or we need to change your treatment, we will call you to review the results.   Testing/Procedures: No new testing needed  Consider CT coronary calcium score  $150 in [place]  Zio patch for record heart arrhythmia, 2 weeks   Follow-Up: At [facility], you and your health needs are our priority.  As part of our continuing mission to provide you with exceptional heart care, we have created designated Provider Care Teams.  These Care Teams include your primary Cardiologist (physician) and Advanced Practice Providers (APPs -  Physician Assistants and Nurse Practitioners) who all work together to provide you with the care you need, when you need it.  . You will need a follow up appointment as needed  . Providers on your designated Care Team:   . Murray Hodgkins, NP . Christell Faith, PA-C . Marrianne Mood, PA-C  Any Other Special Instructions Will Be Listed Below (If Applicable).  For educational health videos Log in to : [email or web address] Or : [email or web address], password : [email or web address]
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
## 2017-12-29 ENCOUNTER — Other Ambulatory Visit: Payer: Self-pay | Admitting: Surgical

## 2017-12-29 MED ORDER — ESTROGENS, CONJUGATED 0.625 MG/GM VA CREA: TOPICAL_CREAM | VAGINAL | 2 refills | Status: DC

## 2018-03-23 ENCOUNTER — Ambulatory Visit: Payer: BLUE CROSS/BLUE SHIELD | Admitting: Family Medicine

## 2018-03-23 ENCOUNTER — Encounter: Payer: Self-pay | Admitting: Family Medicine

## 2018-03-23 VITALS — BP 148/84 | HR 78 | Temp 97.6°F | Wt 142.2 lb

## 2018-03-23 DIAGNOSIS — M25512 Pain in left shoulder: Secondary | ICD-10-CM

## 2018-03-23 MED ORDER — METHYLPREDNISOLONE ACETATE 40 MG/ML IJ SUSP
80.0000 mg | Freq: Once | INTRAMUSCULAR | Status: AC
  Administered 2018-03-23: 80 mg via INTRAMUSCULAR

## 2018-03-23 MED ORDER — LIDOCAINE HCL (PF) 1 % IJ SOLN
4.0000 mL | Freq: Once | INTRAMUSCULAR | Status: AC
  Administered 2018-03-23: 4 mL via INTRADERMAL

## 2018-03-23 NOTE — Addendum Note (Signed)
Addended by: Jules Schick on: 03/23/2018 04:06 PM   Modules accepted: Orders

## 2018-03-23 NOTE — Progress Notes (Signed)
Patient: Tracie Hall Female    DOB: [birth year]   63 y.o.   MRN: [id number] Visit Date: 03/23/2018  Today's Provider: Lavon Paganini, MD   Chief Complaint  Patient presents with  . Shoulder Pain   Subjective:     Shoulder Pain   The pain is present in the left shoulder. This is a recurrent problem. The current episode started more than 1 month ago. There has been no history of extremity trauma. The problem occurs daily (Pt experiences pain when trying to do tasks otherwise it's not in pain). The problem has been unchanged. The pain is at a severity of 4/10. The pain is mild. Associated symptoms include a limited range of motion. The symptoms are aggravated by activity. She has tried NSAIDS for the symptoms. The treatment provided mild relief.    No Known Allergies   Current Outpatient Medications:  .  ACZONE 7.5 % GEL, Apply topically ONCE daily, Disp: , Rfl: 6 .  CALCIUM-VITAMIN D PO, Take 600 mg by mouth daily., Disp: , Rfl:  .  conjugated estrogens (PREMARIN) vaginal cream, PLACE 7.12 APPLICATORFULS VAGINALLY 2 (TWO) TIMES A WEEK., Disp: 30 g, Rfl: 2 .  metoprolol succinate (TOPROL-XL) 25 MG 24 hr tablet, TAKE 1 TABLET BY MOUTH EVERY DAY, Disp: 90 tablet, Rfl: 3 .  Multiple Vitamin (MULTIVITAMIN) capsule, Take 1 capsule by mouth daily., Disp: , Rfl:   Review of Systems  Constitutional: Negative.   HENT: Negative.   Eyes: Negative.   Respiratory: Negative.   Cardiovascular: Negative.   Gastrointestinal: Negative.   Endocrine: Negative.   Genitourinary: Negative.   Musculoskeletal: Negative.   Skin: Negative.   Allergic/Immunologic: Negative.   Neurological: Negative.   Hematological: Negative.   Psychiatric/Behavioral: Negative.     Social History   Tobacco Use  . Smoking status: Never Smoker  . Smokeless tobacco: Never Used  Substance Use Topics  . Alcohol use: Yes    Comment: occasional      Objective:   There were no vitals taken for this  visit. There were no vitals filed for this visit.   Physical Exam Vitals signs reviewed.  Constitutional:      General: She is not in acute distress.    Appearance: Normal appearance. She is not diaphoretic.  HENT:     Head: Normocephalic and atraumatic.  Eyes:     General: No scleral icterus.    Conjunctiva/sclera: Conjunctivae normal.  Cardiovascular:     Rate and Rhythm: Normal rate and regular rhythm.     Pulses: Normal pulses.     Heart sounds: Normal heart sounds. No murmur.  Pulmonary:     Effort: Pulmonary effort is normal. No respiratory distress.     Breath sounds: Normal breath sounds. No wheezing or rhonchi.  Musculoskeletal:     Right lower leg: No edema.     Left lower leg: No edema.     Comments: L Shoulder:  Inspection reveals no abnormalities, atrophy or asymmetry. Palpation is normal with no tenderness over AC joint or bicipital groove. ROM is limited in internal rotation, abduction and flexion. Rotator cuff strength normal throughout. positive Hawkin's test. No labral pathology noted with negative Obrien's, negative clunk and good stability. Normal scapular function observed. No painful arc and no drop arm sign. No apprehension sign   Skin:    General: Skin is warm and dry.     Capillary Refill: Capillary refill takes less than 2 seconds.  Findings: No rash.  Neurological:     Mental Status: She is alert and oriented to person, place, and time. Mental status is at baseline.     Sensory: No sensory deficit.     Motor: No weakness.  Psychiatric:        Mood and Affect: Mood normal.        Behavior: Behavior normal.         Assessment & Plan   1. Acute pain of left shoulder - new problem, present for ~1 month - signs of early frozen shoulder - discussed that this is often idiopathic and worse in older females - discussed importance of physical therapy - will do HEP and consider formal PT referral - discussed option to see Ortho for large  volume intra-articular joint injection, but patient rpefers to try a subacromial corticosteroid injection first -Injection performed today-see procedure note below -Discussed rice and return precautions   INJECTION: Patient was given informed consent. Appropriate time out was taken. Area prepped and draped in usual sterile fashion. 2 cc of depomedrol 40 mg/ml plus  4 cc of 1% lidocaine without epinephrine was injected into the L shoulder subacromial space using a(n) posterior approach. The patient tolerated the procedure well. There were no complications. Post procedure instructions were given.   Return if symptoms worsen or fail to improve.   The entirety of the information documented in the History of Present Illness, Review of Systems and Physical Exam were personally obtained by me. Portions of this information were initially documented by Tiburcio Pea, CMA and reviewed by me for thoroughness and accuracy.    Virginia Crews, MD, MPH [facility] 03/23/2018 3:40 PM

## 2018-03-23 NOTE — Patient Instructions (Signed)

## 2018-06-20 DIAGNOSIS — Z1211 Encounter for screening for malignant neoplasm of colon: Secondary | ICD-10-CM | POA: Diagnosis not present

## 2018-06-20 DIAGNOSIS — Z1231 Encounter for screening mammogram for malignant neoplasm of breast: Secondary | ICD-10-CM | POA: Diagnosis not present

## 2018-06-20 DIAGNOSIS — N952 Postmenopausal atrophic vaginitis: Secondary | ICD-10-CM | POA: Diagnosis not present

## 2018-06-20 DIAGNOSIS — Z01419 Encounter for gynecological examination (general) (routine) without abnormal findings: Secondary | ICD-10-CM | POA: Diagnosis not present

## 2018-06-20 LAB — HM PAP SMEAR: HM Pap smear: NEGATIVE

## 2018-06-21 ENCOUNTER — Other Ambulatory Visit: Payer: Self-pay | Admitting: Obstetrics & Gynecology

## 2018-06-21 ENCOUNTER — Encounter: Payer: BLUE CROSS/BLUE SHIELD | Admitting: Obstetrics and Gynecology

## 2018-06-21 DIAGNOSIS — N6320 Unspecified lump in the left breast, unspecified quadrant: Secondary | ICD-10-CM

## 2018-07-09 ENCOUNTER — Encounter: Payer: Self-pay | Admitting: Family Medicine

## 2018-07-09 DIAGNOSIS — M25512 Pain in left shoulder: Secondary | ICD-10-CM

## 2018-07-31 ENCOUNTER — Ambulatory Visit: Payer: BC Managed Care – PPO | Admitting: Physical Therapy

## 2018-07-31 ENCOUNTER — Ambulatory Visit: Payer: BC Managed Care – PPO | Admitting: Family Medicine

## 2018-07-31 ENCOUNTER — Ambulatory Visit
Admission: RE | Admit: 2018-07-31 | Discharge: 2018-07-31 | Disposition: A | Discharge status: Home / Self Care | Payer: BC Managed Care – PPO | Source: Ambulatory Visit | Attending: Family Medicine | Admitting: Family Medicine

## 2018-07-31 ENCOUNTER — Encounter: Payer: Self-pay | Admitting: Family Medicine

## 2018-07-31 ENCOUNTER — Other Ambulatory Visit: Payer: Self-pay

## 2018-07-31 ENCOUNTER — Ambulatory Visit
Admission: RE | Admit: 2018-07-31 | Discharge: 2018-07-31 | Disposition: A | Discharge status: Home / Self Care | Payer: BC Managed Care – PPO | Attending: Family Medicine | Admitting: Family Medicine

## 2018-07-31 VITALS — BP 156/80 | HR 89 | Temp 98.3°F | Wt 144.4 lb

## 2018-07-31 DIAGNOSIS — M7502 Adhesive capsulitis of left shoulder: Secondary | ICD-10-CM

## 2018-07-31 DIAGNOSIS — M62838 Other muscle spasm: Secondary | ICD-10-CM | POA: Diagnosis not present

## 2018-07-31 DIAGNOSIS — M25512 Pain in left shoulder: Secondary | ICD-10-CM | POA: Diagnosis not present

## 2018-07-31 MED ORDER — CYCLOBENZAPRINE HCL 5 MG PO TABS: 5.0000 mg | ORAL_TABLET | Freq: Three times a day (TID) | ORAL | 0 refills | Status: DC | PRN

## 2018-07-31 NOTE — Progress Notes (Signed)
Patient: Tracie Hall Female    DOB: 1955/03/29   63 y.o.   MRN: [id number] Visit Date: 07/31/2018  Today's Provider: Lavon Paganini, MD   Chief Complaint  Patient presents with  . Shoulder Pain   Subjective:    Shoulder Pain  The pain is present in the left shoulder. The current episode started more than 1 month ago. The problem has been unchanged. The quality of the pain is described as aching. Associated symptoms include an inability to bear weight and a limited range of motion. Pertinent negatives include no joint swelling or stiffness. The symptoms are aggravated by activity. She has tried cold and NSAIDS for the symptoms. The treatment provided mild relief.  Patient states she had appointment with PT today 07/31/2018 but did not go due to it been $1000.   Shoulder (subacromial) injection in 02/2018. Didn't help much.  Holding grandson (1 m/o)  last week seemed to aggravate it.  Dull ache and limited internal rotation and abduction and flexion  No Known Allergies   Current Outpatient Medications:  .  ACZONE 7.5 % GEL, Apply topically ONCE daily, Disp: , Rfl: 6 .  CALCIUM-VITAMIN D PO, Take 600 mg by mouth daily., Disp: , Rfl:  .  conjugated estrogens (PREMARIN) vaginal cream, PLACE 8.10 APPLICATORFULS VAGINALLY 2 (TWO) TIMES A WEEK., Disp: 30 g, Rfl: 2 .  metoprolol succinate (TOPROL-XL) 25 MG 24 hr tablet, TAKE 1 TABLET BY MOUTH EVERY DAY, Disp: 90 tablet, Rfl: 3 .  Multiple Vitamin (MULTIVITAMIN) capsule, Take 1 capsule by mouth daily., Disp: , Rfl:  .  cyclobenzaprine (FLEXERIL) 5 MG tablet, Take 1 tablet (5 mg total) by mouth 3 (three) times daily as needed for muscle spasms., Disp: 30 tablet, Rfl: 0  Review of Systems  Constitutional: Negative.   Respiratory: Negative.   Genitourinary: Negative.   Musculoskeletal: Negative for stiffness.  Neurological: Negative.     Social History   Tobacco Use  . Smoking status: Never Smoker  . Smokeless tobacco: Never  Used  Substance Use Topics  . Alcohol use: Yes    Comment: occasional      Objective:   BP (!) 156/80 (BP Location: Left Arm, Patient Position: Sitting, Cuff Size: Normal)   Pulse 89   Temp 98.3 F (36.8 C) (Oral)   Wt 144 lb 6.4 oz (65.5 kg)   SpO2 96%   BMI 24.03 kg/m  Vitals:   07/31/18 0851  BP: (!) 156/80  Pulse: 89  Temp: 98.3 F (36.8 C)  TempSrc: Oral  SpO2: 96%  Weight: 144 lb 6.4 oz (65.5 kg)     Physical Exam Constitutional:      Appearance: Normal appearance.  HENT:     Head: Normocephalic and atraumatic.  Cardiovascular:     Rate and Rhythm: Normal rate and regular rhythm.  Pulmonary:     Effort: Pulmonary effort is normal. No respiratory distress.  Musculoskeletal:     Comments: L Shoulder:  Inspection reveals no abnormalities, atrophy or asymmetry.  Mild TTP over bicipital groove. ROM limited in internal rotation nad flexion/abduction.  Unable to raise arm to 90 degrees. Rotator cuff strength normal throughout. No signs of impingement with negative Hawkin's test, empty can. Tightness of L trapezius muscle  Skin:    General: Skin is warm and dry.     Capillary Refill: Capillary refill takes less than 2 seconds.     Findings: No rash.  Neurological:     Mental Status:  She is alert and oriented to person, place, and time. Mental status is at baseline.  Psychiatric:        Mood and Affect: Mood normal.        Behavior: Behavior normal.    Dg Shoulder Left  Result Date: 07/31/2018 CLINICAL DATA:  LEFT shoulder pain, decreased range of motion. EXAM: LEFT SHOULDER - 2+ VIEW COMPARISON:  None. FINDINGS: There is no evidence of fracture or dislocation. There is no evidence of arthropathy or other focal bone abnormality. Soft tissues are unremarkable. IMPRESSION: Negative. Electronically Signed   By: Franki Cabot M.D.   On: 07/31/2018 11:21       Assessment & Plan   1. Adhesive capsulitis of left shoulder - ongoing since 02/2018 and ROM is now worse -  discussed role of large volume intracapsular injection and PT - will start HEP and refer to Ortho - obtain XRay, but doubt any bony abnormalities - continue Aleve prn - discussed icing - DG Shoulder Left; Future - Ambulatory referral to Orthopedic Surgery  2. Trapezius muscle spasm - new problem - could be related to carrying around newborn recently - flexeril prn - stretching and HEP as above    Meds ordered this encounter  Medications  . cyclobenzaprine (FLEXERIL) 5 MG tablet    Sig: Take 1 tablet (5 mg total) by mouth 3 (three) times daily as needed for muscle spasms.    Dispense:  30 tablet    Refill:  0     Return if symptoms worsen or fail to improve.   The entirety of the information documented in the History of Present Illness, Review of Systems and Physical Exam were personally obtained by me. Portions of this information were initially documented by [facility], CMA and reviewed by me for thoroughness and accuracy.    Averi Cacioppo, Dionne Bucy, MD MPH [facility]

## 2018-08-02 ENCOUNTER — Encounter: Payer: BC Managed Care – PPO | Admitting: Physical Therapy

## 2018-08-08 ENCOUNTER — Encounter: Payer: BC Managed Care – PPO | Admitting: Physical Therapy

## 2018-08-09 ENCOUNTER — Ambulatory Visit: Payer: BC Managed Care – PPO | Admitting: Family Medicine

## 2018-08-09 ENCOUNTER — Telehealth: Payer: Self-pay | Admitting: Cardiovascular Disease

## 2018-08-09 ENCOUNTER — Encounter: Payer: Self-pay | Admitting: Family Medicine

## 2018-08-09 ENCOUNTER — Other Ambulatory Visit: Payer: Self-pay

## 2018-08-09 DIAGNOSIS — I10 Essential (primary) hypertension: Secondary | ICD-10-CM | POA: Diagnosis not present

## 2018-08-09 MED ORDER — METOPROLOL SUCCINATE ER 50 MG PO TB24: 50.0000 mg | ORAL_TABLET | Freq: Every day | ORAL | 1 refills | Status: DC

## 2018-08-09 NOTE — Telephone Encounter (Signed)
Pt c/o BP issue: STAT if pt c/o blurred vision, one-sided weakness or slurred speech  1. What are your last 5 BP readings? 154/94  2. Are you having any other symptoms (ex. Dizziness, headache, blurred vision, passed out)? No   3. What is your BP issue? Patient states that her BP is higher than normal.  Would like to know if she can come by for a blood pressure check.  Please call to discuss.

## 2018-08-09 NOTE — Patient Instructions (Signed)

## 2018-08-09 NOTE — Telephone Encounter (Signed)
Patient is going in to see PCP, does not need a call back.

## 2018-08-09 NOTE — Progress Notes (Signed)
Patient: Tracie Hall Female    DOB: 1955/12/20   64 y.o.   MRN: [id number] Visit Date: 08/09/2018  Today's Provider: Lavon Paganini, MD   No chief complaint on file.  Subjective:       HPI  Hypertension, follow-up:  BP Readings from Last 3 Encounters:  08/09/18 (!) 142/76  07/31/18 (!) 156/80  03/23/18 (!) 148/84    She was last seen for hypertension 1 years ago.  BP at that visit was well controlled. Management changes since that visit include none. She reports excellent compliance with treatment. She is not having side effects.  Outside blood pressures are elevated as below. She is experiencing headaches.  Patient denies chest pain, chest pressure/discomfort, claudication, dyspnea, exertional chest pressure/discomfort and orthopnea.   Cardiovascular risk factors include hypertension.  Use of agents associated with hypertension: none.     Weight trend: stable Wt Readings from Last 3 Encounters:  08/09/18 142 lb (64.4 kg)  07/31/18 144 lb 6.4 oz (65.5 kg)  03/23/18 142 lb 3.2 oz (64.5 kg)    Current diet: low salt   Patient states that her blood pressure is fluctuating between 164/98-139/87. She has been experiencing a mild headache. She under a lot of stress at work.  ------------------------------------------------------------------------   No Known Allergies   Current Outpatient Medications:  .  ACZONE 7.5 % GEL, Apply topically ONCE daily, Disp: , Rfl: 6 .  CALCIUM-VITAMIN D PO, Take 600 mg by mouth daily., Disp: , Rfl:  .  conjugated estrogens (PREMARIN) vaginal cream, PLACE 1.70 APPLICATORFULS VAGINALLY 2 (TWO) TIMES A WEEK., Disp: 30 g, Rfl: 2 .  cyclobenzaprine (FLEXERIL) 5 MG tablet, Take 1 tablet (5 mg total) by mouth 3 (three) times daily as needed for muscle spasms., Disp: 30 tablet, Rfl: 0 .  metoprolol succinate (TOPROL-XL) 25 MG 24 hr tablet, TAKE 1 TABLET BY MOUTH EVERY DAY, Disp: 90 tablet, Rfl: 3 .  Multiple Vitamin  (MULTIVITAMIN) capsule, Take 1 capsule by mouth daily., Disp: , Rfl:   Review of Systems  Constitutional: Negative.   Respiratory: Negative.   Genitourinary: Negative.   Neurological: Negative.     Social History   Tobacco Use  . Smoking status: Never Smoker  . Smokeless tobacco: Never Used  Substance Use Topics  . Alcohol use: Yes    Comment: occasional      Objective:   BP (!) 142/76 (BP Location: Left Arm, Patient Position: Sitting, Cuff Size: Normal)   Pulse 80   Temp 97.8 F (36.6 C) (Oral)   Resp 18   Wt 142 lb (64.4 kg)   SpO2 98%   BMI 23.63 kg/m  Vitals:   08/09/18 1458  BP: (!) 142/76  Pulse: 80  Resp: 18  Temp: 97.8 F (36.6 C)  TempSrc: Oral  SpO2: 98%  Weight: 142 lb (64.4 kg)   Home BP cuff read 150/88 today right after our measurement  Physical Exam Vitals signs reviewed.  Constitutional:      General: She is not in acute distress.    Appearance: Normal appearance. She is well-developed. She is not diaphoretic.  HENT:     Head: Normocephalic and atraumatic.  Eyes:     General: No scleral icterus.    Conjunctiva/sclera: Conjunctivae normal.  Neck:     Musculoskeletal: Neck supple.     Thyroid: No thyromegaly.  Cardiovascular:     Rate and Rhythm: Normal rate and regular rhythm.     Pulses: Normal pulses.  Heart sounds: Normal heart sounds. No murmur.  Pulmonary:     Effort: Pulmonary effort is normal. No respiratory distress.     Breath sounds: Normal breath sounds. No wheezing, rhonchi or rales.  Musculoskeletal:     Right lower leg: No edema.     Left lower leg: No edema.  Lymphadenopathy:     Cervical: No cervical adenopathy.  Skin:    General: Skin is warm and dry.     Capillary Refill: Capillary refill takes less than 2 seconds.     Findings: No rash.  Neurological:     Mental Status: She is alert and oriented to person, place, and time. Mental status is at baseline.  Psychiatric:        Mood and Affect: Mood normal.         Behavior: Behavior normal.      No results found for any visits on 08/09/18.     Assessment & Plan   Problem List Items Addressed This Visit      Cardiovascular and Mediastinum   Hypertension    Uncontrolled Discussed that stress likely contributes to this elevation She was initially started on metoprolol several years ago for her tremor and this was continued because she does have occasional palpitations I discussed with her that metoprolol is not the ideal initial medication for hypertension, but as she is currently taking it and tolerating it well, we will attempt to increase the dose from 25 mg to 50 mg daily to see if this improves her blood pressure control She will continue to monitor home blood pressures At follow-up in 1 month, if still remains elevated, may consider amlodipine or HCTZ Discussed low-sodium diet and blood pressure goals as well as red flag symptoms      Relevant Medications   metoprolol succinate (TOPROL-XL) 50 MG 24 hr tablet       Return in about 4 weeks (around 09/06/2018) for BP f/u.   The entirety of the information documented in the History of Present Illness, Review of Systems and Physical Exam were personally obtained by me. Portions of this information were initially documented by Doran Clay, CMA and reviewed by me for thoroughness and accuracy.    Bacigalupo, Dionne Bucy, MD MPH [facility]

## 2018-08-09 NOTE — Telephone Encounter (Signed)
Left voicemail message to call back regarding previous call.  

## 2018-08-09 NOTE — Telephone Encounter (Signed)
Patient is returning your call. States she does not need a call back. She got an appointment with her PCP

## 2018-08-09 NOTE — Assessment & Plan Note (Addendum)
Uncontrolled Discussed that stress likely contributes to this elevation She was initially started on metoprolol several years ago for her tremor and this was continued because she does have occasional palpitations I discussed with her that metoprolol is not the ideal initial medication for hypertension, but as she is currently taking it and tolerating it well, we will attempt to increase the dose from 25 mg to 50 mg daily to see if this improves her blood pressure control She will continue to monitor home blood pressures At follow-up in 1 month, if still remains elevated, may consider amlodipine or HCTZ Discussed low-sodium diet and blood pressure goals as well as red flag symptoms

## 2018-08-09 NOTE — Telephone Encounter (Signed)
Left detailed voicemail message that I got her message of no need to call her back but that I wanted to just make sure all was addressed and that I will close encounter but if she has any further questions or concerns to please give [place] a call back.

## 2018-08-10 ENCOUNTER — Telehealth: Payer: Self-pay

## 2018-08-10 ENCOUNTER — Encounter: Payer: BC Managed Care – PPO | Admitting: Physical Therapy

## 2018-08-10 NOTE — Telephone Encounter (Signed)
Patient called stating concerns about her BP reading 157/95. Denies having any concerning symptoms but will potentially need a medication adjustment..  Pt has established Primary Care with Dr. Brita Romp who has prescribed metoprolol.  Patient will follow up with her today.

## 2018-08-14 ENCOUNTER — Encounter: Payer: Self-pay | Admitting: Family Medicine

## 2018-08-14 DIAGNOSIS — M7502 Adhesive capsulitis of left shoulder: Secondary | ICD-10-CM | POA: Diagnosis not present

## 2018-08-15 ENCOUNTER — Encounter: Payer: BC Managed Care – PPO | Admitting: Physical Therapy

## 2018-08-17 ENCOUNTER — Encounter: Payer: BC Managed Care – PPO | Admitting: Physical Therapy

## 2018-08-21 ENCOUNTER — Encounter: Payer: BC Managed Care – PPO | Admitting: Physical Therapy

## 2018-08-23 ENCOUNTER — Encounter: Payer: BC Managed Care – PPO | Admitting: Physical Therapy

## 2018-08-23 DIAGNOSIS — M25512 Pain in left shoulder: Secondary | ICD-10-CM | POA: Diagnosis not present

## 2018-08-23 DIAGNOSIS — M25612 Stiffness of left shoulder, not elsewhere classified: Secondary | ICD-10-CM | POA: Diagnosis not present

## 2018-08-25 DIAGNOSIS — M25612 Stiffness of left shoulder, not elsewhere classified: Secondary | ICD-10-CM | POA: Diagnosis not present

## 2018-08-25 DIAGNOSIS — M25512 Pain in left shoulder: Secondary | ICD-10-CM | POA: Diagnosis not present

## 2018-08-28 ENCOUNTER — Encounter: Payer: BC Managed Care – PPO | Admitting: Physical Therapy

## 2018-08-28 DIAGNOSIS — M25512 Pain in left shoulder: Secondary | ICD-10-CM | POA: Diagnosis not present

## 2018-08-28 DIAGNOSIS — M25612 Stiffness of left shoulder, not elsewhere classified: Secondary | ICD-10-CM | POA: Diagnosis not present

## 2018-08-30 ENCOUNTER — Encounter: Payer: BC Managed Care – PPO | Admitting: Physical Therapy

## 2018-08-30 DIAGNOSIS — M7502 Adhesive capsulitis of left shoulder: Secondary | ICD-10-CM | POA: Diagnosis not present

## 2018-09-01 DIAGNOSIS — M25512 Pain in left shoulder: Secondary | ICD-10-CM | POA: Diagnosis not present

## 2018-09-01 DIAGNOSIS — M25612 Stiffness of left shoulder, not elsewhere classified: Secondary | ICD-10-CM | POA: Diagnosis not present

## 2018-09-04 ENCOUNTER — Encounter: Payer: BC Managed Care – PPO | Admitting: Physical Therapy

## 2018-09-04 DIAGNOSIS — M25512 Pain in left shoulder: Secondary | ICD-10-CM | POA: Diagnosis not present

## 2018-09-04 DIAGNOSIS — M25612 Stiffness of left shoulder, not elsewhere classified: Secondary | ICD-10-CM | POA: Diagnosis not present

## 2018-09-06 ENCOUNTER — Encounter: Payer: BC Managed Care – PPO | Admitting: Physical Therapy

## 2018-09-06 DIAGNOSIS — M7502 Adhesive capsulitis of left shoulder: Secondary | ICD-10-CM | POA: Diagnosis not present

## 2018-09-08 DIAGNOSIS — M25612 Stiffness of left shoulder, not elsewhere classified: Secondary | ICD-10-CM | POA: Diagnosis not present

## 2018-09-08 DIAGNOSIS — M25512 Pain in left shoulder: Secondary | ICD-10-CM | POA: Diagnosis not present

## 2018-09-11 ENCOUNTER — Encounter: Payer: BC Managed Care – PPO | Admitting: Physical Therapy

## 2018-09-11 DIAGNOSIS — M25512 Pain in left shoulder: Secondary | ICD-10-CM | POA: Diagnosis not present

## 2018-09-11 DIAGNOSIS — M25612 Stiffness of left shoulder, not elsewhere classified: Secondary | ICD-10-CM | POA: Diagnosis not present

## 2018-09-13 ENCOUNTER — Encounter: Payer: Self-pay | Admitting: Family Medicine

## 2018-09-13 ENCOUNTER — Other Ambulatory Visit: Payer: Self-pay

## 2018-09-13 ENCOUNTER — Encounter: Payer: BC Managed Care – PPO | Admitting: Physical Therapy

## 2018-09-13 ENCOUNTER — Ambulatory Visit: Payer: BC Managed Care – PPO | Admitting: Family Medicine

## 2018-09-13 VITALS — BP 137/83 | HR 96 | Temp 96.6°F | Resp 16 | Ht 65.0 in | Wt 139.0 lb

## 2018-09-13 DIAGNOSIS — I10 Essential (primary) hypertension: Secondary | ICD-10-CM | POA: Diagnosis not present

## 2018-09-13 NOTE — Assessment & Plan Note (Signed)
Well controlled Continue current medications Recheck metabolic panel F/u in 6 months  

## 2018-09-13 NOTE — Progress Notes (Signed)
Patient: Tracie Hall Female    DOB: Feb 22, 1955   63 y.o.   MRN: [id number] Visit Date: 09/13/2018  Today's Provider: Lavon Paganini, MD   Chief Complaint  Patient presents with  . Hypertension   Subjective:     HPI  Hypertension, follow-up:  BP Readings from Last 3 Encounters:  09/13/18 137/83  08/09/18 (!) 142/76  07/31/18 (!) 156/80    She was last seen for hypertension 1 months ago.  BP at that visit was 142/76. Management changes since that visit include: increased Metoprolol Succinate to 50 MG. She reports excellent compliance with treatment. She is not having side effects.  She is exercising. She is adherent to low salt diet.   Outside blood pressures are stable. 120s-130s/70s.  Notes that it is higher after work. She is experiencing none.  Patient denies chest pain and lower extremity edema.   Cardiovascular risk factors include hypertension.  Use of agents associated with hypertension: none.     Weight trend: stable Wt Readings from Last 3 Encounters:  09/13/18 139 lb (63 kg)  08/09/18 142 lb (64.4 kg)  07/31/18 144 lb 6.4 oz (65.5 kg)    Current diet: low salt  ------------------------------------------------------------------------  Wt Readings from Last 3 Encounters:  09/13/18 139 lb (63 kg)  08/09/18 142 lb (64.4 kg)  07/31/18 144 lb 6.4 oz (65.5 kg)     No Known Allergies   Current Outpatient Medications:  .  CALCIUM-VITAMIN D PO, Take 600 mg by mouth daily., Disp: , Rfl:  .  conjugated estrogens (PREMARIN) vaginal cream, PLACE 8.24 APPLICATORFULS VAGINALLY 2 (TWO) TIMES A WEEK., Disp: 30 g, Rfl: 2 .  metoprolol succinate (TOPROL-XL) 50 MG 24 hr tablet, Take 1 tablet (50 mg total) by mouth daily., Disp: 90 tablet, Rfl: 1 .  Multiple Vitamin (MULTIVITAMIN) capsule, Take 1 capsule by mouth daily., Disp: , Rfl:  .  cyclobenzaprine (FLEXERIL) 5 MG tablet, Take 1 tablet (5 mg total) by mouth 3 (three) times daily as needed for muscle  spasms. (Patient not taking: Reported on 09/13/2018), Disp: 30 tablet, Rfl: 0  Review of Systems  Constitutional: Negative.   Respiratory: Negative.   Genitourinary: Negative.   Neurological: Negative.     Social History   Tobacco Use  . Smoking status: Never Smoker  . Smokeless tobacco: Never Used  Substance Use Topics  . Alcohol use: Yes    Comment: occasional      Objective:   BP 137/83 (BP Location: Left Arm, Patient Position: Sitting, Cuff Size: Normal)   Pulse 96   Temp (!) 96.6 F (35.9 C) (Temporal)   Resp 16   Ht 5\' 5"  (1.651 m)   Wt 139 lb (63 kg)   SpO2 98%   BMI 23.13 kg/m  Vitals:   09/13/18 1601  BP: 137/83  Pulse: 96  Resp: 16  Temp: (!) 96.6 F (35.9 C)  TempSrc: Temporal  SpO2: 98%  Weight: 139 lb (63 kg)  Height: 5\' 5"  (1.651 m)     Physical Exam Vitals signs reviewed.  Constitutional:      General: She is not in acute distress.    Appearance: Normal appearance. She is well-developed. She is not diaphoretic.  HENT:     Head: Normocephalic and atraumatic.  Eyes:     General: No scleral icterus.    Conjunctiva/sclera: Conjunctivae normal.  Neck:     Musculoskeletal: Neck supple.     Thyroid: No thyromegaly.  Cardiovascular:  Rate and Rhythm: Normal rate and regular rhythm.     Pulses: Normal pulses.     Heart sounds: Normal heart sounds. No murmur.  Pulmonary:     Effort: Pulmonary effort is normal. No respiratory distress.     Breath sounds: Normal breath sounds. No wheezing, rhonchi or rales.  Musculoskeletal:     Right lower leg: No edema.     Left lower leg: No edema.  Lymphadenopathy:     Cervical: No cervical adenopathy.  Skin:    General: Skin is warm and dry.     Capillary Refill: Capillary refill takes less than 2 seconds.     Findings: No rash.  Neurological:     Mental Status: She is alert and oriented to person, place, and time. Mental status is at baseline.  Psychiatric:        Mood and Affect: Mood normal.         Behavior: Behavior normal.    No results found for any visits on 09/13/18.     Assessment & Plan   Problem List Items Addressed This Visit      Cardiovascular and Mediastinum   Hypertension - Primary    Well controlled Continue current medications Recheck metabolic panel F/u in 6 months           Return in about 6 weeks (around 10/25/2018) for CPE as scheduled.   The entirety of the information documented in the History of Present Illness, Review of Systems and Physical Exam were personally obtained by me. Portions of this information were initially documented by Lynford Humphrey, CMA and reviewed by me for thoroughness and accuracy.    , Dionne Bucy, MD MPH [facility]

## 2018-09-18 ENCOUNTER — Encounter: Payer: BC Managed Care – PPO | Admitting: Physical Therapy

## 2018-09-18 DIAGNOSIS — M25612 Stiffness of left shoulder, not elsewhere classified: Secondary | ICD-10-CM | POA: Diagnosis not present

## 2018-09-18 DIAGNOSIS — M25512 Pain in left shoulder: Secondary | ICD-10-CM | POA: Diagnosis not present

## 2018-09-20 ENCOUNTER — Encounter: Payer: BC Managed Care – PPO | Admitting: Physical Therapy

## 2018-10-05 ENCOUNTER — Other Ambulatory Visit: Payer: Self-pay | Admitting: Family Medicine

## 2018-10-05 DIAGNOSIS — I10 Essential (primary) hypertension: Secondary | ICD-10-CM

## 2018-10-20 ENCOUNTER — Other Ambulatory Visit: Payer: Self-pay

## 2018-10-20 ENCOUNTER — Ambulatory Visit
Admission: RE | Admit: 2018-10-20 | Discharge: 2018-10-20 | Disposition: A | Discharge status: Home / Self Care | Payer: BC Managed Care – PPO | Source: Ambulatory Visit | Attending: Obstetrics & Gynecology | Admitting: Obstetrics & Gynecology

## 2018-10-20 DIAGNOSIS — N6320 Unspecified lump in the left breast, unspecified quadrant: Secondary | ICD-10-CM

## 2018-10-20 DIAGNOSIS — R922 Inconclusive mammogram: Secondary | ICD-10-CM | POA: Diagnosis not present
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
## 2018-10-25 ENCOUNTER — Other Ambulatory Visit: Payer: Self-pay

## 2018-10-25 ENCOUNTER — Encounter: Payer: Self-pay | Admitting: Family Medicine

## 2018-10-25 ENCOUNTER — Ambulatory Visit (INDEPENDENT_AMBULATORY_CARE_PROVIDER_SITE_OTHER): Payer: BC Managed Care – PPO | Admitting: Family Medicine

## 2018-10-25 VITALS — BP 131/76 | HR 82 | Temp 97.1°F | Ht 65.0 in | Wt 141.4 lb

## 2018-10-25 DIAGNOSIS — R7303 Prediabetes: Secondary | ICD-10-CM

## 2018-10-25 DIAGNOSIS — E559 Vitamin D deficiency, unspecified: Secondary | ICD-10-CM

## 2018-10-25 DIAGNOSIS — E538 Deficiency of other specified B group vitamins: Secondary | ICD-10-CM

## 2018-10-25 DIAGNOSIS — Z0000 Encounter for general adult medical examination without abnormal findings: Secondary | ICD-10-CM

## 2018-10-25 DIAGNOSIS — I10 Essential (primary) hypertension: Secondary | ICD-10-CM | POA: Diagnosis not present

## 2018-10-25 DIAGNOSIS — Z23 Encounter for immunization: Secondary | ICD-10-CM | POA: Diagnosis not present

## 2018-10-25 NOTE — Assessment & Plan Note (Signed)
Recheck Vit B12

## 2018-10-25 NOTE — Assessment & Plan Note (Signed)
Recheck Vit D level 

## 2018-10-25 NOTE — Assessment & Plan Note (Signed)
Encouraged low carb diet Recheck A1c 

## 2018-10-25 NOTE — Assessment & Plan Note (Signed)
Well controlled Continue current medications Recheck metabolic panel F/u in 6 months  

## 2018-10-25 NOTE — Patient Instructions (Addendum)
 The CDC recommends two doses of Shingrix (the shingles vaccine) separated by 2 to 6 months for adults age 65 years and older. I recommend checking with your insurance plan regarding coverage for this vaccine.    Preventive Care 40-64 Years Old, Female Preventive care refers to visits with your health care provider and lifestyle choices that can promote health and wellness. This includes:  A yearly physical exam. This may also be called an annual well check.  Regular dental visits and eye exams.  Immunizations.  Screening for certain conditions.  Healthy lifestyle choices, such as eating a healthy diet, getting regular exercise, not using drugs or products that contain nicotine and tobacco, and limiting alcohol use. What can I expect for my preventive care visit? Physical exam Your health care provider will check your:  Height and weight. This may be used to calculate body mass index (BMI), which tells if you are at a healthy weight.  Heart rate and blood pressure.  Skin for abnormal spots. Counseling Your health care provider may ask you questions about your:  Alcohol, tobacco, and drug use.  Emotional well-being.  Home and relationship well-being.  Sexual activity.  Eating habits.  Work and work environment.  Method of birth control.  Menstrual cycle.  Pregnancy history. What immunizations do I need?  Influenza (flu) vaccine  This is recommended every year. Tetanus, diphtheria, and pertussis (Tdap) vaccine  You may need a Td booster every 10 years. Varicella (chickenpox) vaccine  You may need this if you have not been vaccinated. Zoster (shingles) vaccine  You may need this after age 60. Measles, mumps, and rubella (MMR) vaccine  You may need at least one dose of MMR if you were born in 1957 or later. You may also need a second dose. Pneumococcal conjugate (PCV13) vaccine  You may need this if you have certain conditions and were not previously  vaccinated. Pneumococcal polysaccharide (PPSV23) vaccine  You may need one or two doses if you smoke cigarettes or if you have certain conditions. Meningococcal conjugate (MenACWY) vaccine  You may need this if you have certain conditions. Hepatitis A vaccine  You may need this if you have certain conditions or if you travel or work in places where you may be exposed to hepatitis A. Hepatitis B vaccine  You may need this if you have certain conditions or if you travel or work in places where you may be exposed to hepatitis B. Haemophilus influenzae type b (Hib) vaccine  You may need this if you have certain conditions. Human papillomavirus (HPV) vaccine  If recommended by your health care provider, you may need three doses over 6 months. You may receive vaccines as individual doses or as more than one vaccine together in one shot (combination vaccines). Talk with your health care provider about the risks and benefits of combination vaccines. What tests do I need? Blood tests  Lipid and cholesterol levels. These may be checked every 5 years, or more frequently if you are over 65 years old.  Hepatitis C test.  Hepatitis B test. Screening  Lung cancer screening. You may have this screening every year starting at age 55 if you have a 30-pack-year history of smoking and currently smoke or have quit within the past 15 years.  Colorectal cancer screening. All adults should have this screening starting at age 65 and continuing until age 75. Your health care provider may recommend screening at age 45 if you are at increased risk. You will have   tests every 1-10 years, depending on your results and the type of screening test.  Diabetes screening. This is done by checking your blood sugar (glucose) after you have not eaten for a while (fasting). You may have this done every 1-3 years.  Mammogram. This may be done every 1-2 years. Talk with your health care provider about when you should start  having regular mammograms. This may depend on whether you have a family history of breast cancer.  BRCA-related cancer screening. This may be done if you have a family history of breast, ovarian, tubal, or peritoneal cancers.  Pelvic exam and Pap test. This may be done every 3 years starting at age 21. Starting at age 30, this may be done every 5 years if you have a Pap test in combination with an HPV test. Other tests  Sexually transmitted disease (STD) testing.  Bone density scan. This is done to screen for osteoporosis. You may have this scan if you are at high risk for osteoporosis. Follow these instructions at home: Eating and drinking  Eat a diet that includes fresh fruits and vegetables, whole grains, lean protein, and low-fat dairy.  Take vitamin and mineral supplements as recommended by your health care provider.  Do not drink alcohol if: ? Your health care provider tells you not to drink. ? You are pregnant, may be pregnant, or are planning to become pregnant.  If you drink alcohol: ? Limit how much you have to 0-1 drink a day. ? Be aware of how much alcohol is in your drink. In the [place] one drink equals one 12 oz bottle of beer (355 mL), one 5 oz glass of wine (148 mL), or one 1 oz glass of hard liquor (44 mL). Lifestyle  Take daily care of your teeth and gums.  Stay active. Exercise for at least 30 minutes on 5 or more days each week.  Do not use any products that contain nicotine or tobacco, such as cigarettes, e-cigarettes, and chewing tobacco. If you need help quitting, ask your health care provider.  If you are sexually active, practice safe sex. Use a condom or other form of birth control (contraception) in order to prevent pregnancy and STIs (sexually transmitted infections).  If told by your health care provider, take low-dose aspirin daily starting at age 65. What's next?  Visit your health care provider once a year for a well check visit.  Ask your health  care provider how often you should have your eyes and teeth checked.  Stay up to date on all vaccines. This information is not intended to replace advice given to you by your health care provider. Make sure you discuss any questions you have with your health care provider. Document Released: 02/07/2015 Document Revised: 09/22/2017 Document Reviewed: 09/22/2017 Elsevier Patient Education  2020 Elsevier Inc.  

## 2018-11-08 ENCOUNTER — Ambulatory Visit: Payer: BLUE CROSS/BLUE SHIELD

## 2018-11-08 ENCOUNTER — Other Ambulatory Visit: Payer: Self-pay

## 2018-11-08 DIAGNOSIS — Z0000 Encounter for general adult medical examination without abnormal findings: Secondary | ICD-10-CM | POA: Insufficient documentation

## 2018-11-09 ENCOUNTER — Other Ambulatory Visit: Payer: Self-pay

## 2018-11-09 DIAGNOSIS — Z0000 Encounter for general adult medical examination without abnormal findings: Secondary | ICD-10-CM

## 2018-11-10 ENCOUNTER — Telehealth: Payer: Self-pay

## 2018-11-10 ENCOUNTER — Encounter: Payer: Self-pay | Admitting: Family Medicine

## 2018-11-10 LAB — COMPREHENSIVE METABOLIC PANEL
ALT: 45 IU/L — ABNORMAL HIGH (ref 0–32)
AST: 41 IU/L — ABNORMAL HIGH (ref 0–40)
Albumin/Globulin Ratio: 1.6 (ref 1.2–2.2)
Albumin: 4.4 g/dL (ref 3.8–4.8)
Alkaline Phosphatase: 80 IU/L (ref 39–117)
BUN/Creatinine Ratio: 20 (ref 12–28)
BUN: 17 mg/dL (ref 8–27)
Bilirubin Total: 0.8 mg/dL (ref 0.0–1.2)
CO2: 24 mmol/L (ref 20–29)
Calcium: 9.9 mg/dL (ref 8.7–10.3)
Chloride: 103 mmol/L (ref 96–106)
Creatinine, Ser: 0.87 mg/dL (ref 0.57–1.00)
GFR calc Af Amer: 81 mL/min/{1.73_m2} (ref >59)
GFR calc non Af Amer: 71 mL/min/{1.73_m2} (ref >59)
Globulin, Total: 2.7 g/dL (ref 1.5–4.5)
Glucose: 98 mg/dL (ref 65–99)
Potassium: 4.4 mmol/L (ref 3.5–5.2)
Sodium: 143 mmol/L (ref 134–144)
Total Protein: 7.1 g/dL (ref 6.0–8.5)

## 2018-11-10 LAB — CBC WITH DIFFERENTIAL/PLATELET
Basophils Absolute: 0.1 10*3/uL (ref 0.0–0.2)
Basos: 1 %
EOS (ABSOLUTE): 0.2 10*3/uL (ref 0.0–0.4)
Eos: 3 %
Hematocrit: 41.1 % (ref 34.0–46.6)
Hemoglobin: 13.7 g/dL (ref 11.1–15.9)
Immature Grans (Abs): 0 10*3/uL (ref 0.0–0.1)
Immature Granulocytes: 0 %
Lymphocytes Absolute: 1 10*3/uL (ref 0.7–3.1)
Lymphs: 19 %
MCH: 30 pg (ref 26.6–33.0)
MCHC: 33.3 g/dL (ref 31.5–35.7)
MCV: 90 fL (ref 79–97)
Monocytes Absolute: 0.5 10*3/uL (ref 0.1–0.9)
Monocytes: 9 %
Neutrophils Absolute: 3.6 10*3/uL (ref 1.4–7.0)
Neutrophils: 68 %
Platelets: 205 10*3/uL (ref 150–450)
RBC: 4.57 x10E6/uL (ref 3.77–5.28)
RDW: 12.1 % (ref 11.7–15.4)
WBC: 5.3 10*3/uL (ref 3.4–10.8)

## 2018-11-10 LAB — SPECIMEN STATUS REPORT

## 2018-11-10 LAB — LIPID PANEL
Chol/HDL Ratio: 2.3 ratio (ref 0.0–4.4)
Cholesterol, Total: 202 mg/dL — ABNORMAL HIGH (ref 100–199)
HDL: 87 mg/dL (ref >39)
LDL Chol Calc (NIH): 101 mg/dL — ABNORMAL HIGH (ref 0–99)
Triglycerides: 81 mg/dL (ref 0–149)
VLDL Cholesterol Cal: 14 mg/dL (ref 5–40)

## 2018-11-10 LAB — VITAMIN B12: Vitamin B-12: 345 pg/mL (ref 232–1245)

## 2018-11-10 LAB — HGB A1C W/O EAG: Hgb A1c MFr Bld: 5.8 % — ABNORMAL HIGH (ref 4.8–5.6)

## 2018-11-10 LAB — TSH: TSH: 3.53 u[IU]/mL (ref 0.450–4.500)

## 2018-11-10 LAB — VITAMIN B1

## 2018-11-10 LAB — VITAMIN D 25 HYDROXY (VIT D DEFICIENCY, FRACTURES): Vit D, 25-Hydroxy: 33.1 ng/mL (ref 30.0–100.0)

## 2018-11-10 NOTE — Telephone Encounter (Signed)
Patient notified of results. She reviewed them on her MyChart as well. She had some questions  regarding the numbers related to the liver function. Information given to patient regarding where she find the test results and number ranges. Told to call back if she has any further questions. She states that she never uses Tylenol and has one glass of wine every two weeks. Follow up appointment scheduled for January 2021.

## 2018-11-10 NOTE — Telephone Encounter (Signed)
-----   Message from Virginia Crews, MD sent at 11/10/2018  3:19 PM EDT ----- Normal B12, vitamin D, thyroid function, kidney function, electrolytes, blood counts.  Liver function tests are very slightly elevated.  Recommend cutting back on alcohol and Tylenol.  We will recheck at next visit.  Cholesterol is stable and slightly elevated, but no medication needed at this time.  I recommend diet low in saturated fat and regular exercise - 30 min at least 5 times per week.  A1c, 6-month average of blood sugars, remains stable and in the prediabetic range.  I recommend low-carb diet and regular exercise.

## 2018-11-10 NOTE — Telephone Encounter (Signed)
No answer or voicemail setup to leave a message will try patient at a later time.

## 2018-11-10 NOTE — Telephone Encounter (Signed)
FYI

## 2018-11-13 ENCOUNTER — Encounter: Payer: Self-pay | Admitting: Family Medicine

## 2018-12-14 DIAGNOSIS — D225 Melanocytic nevi of trunk: Secondary | ICD-10-CM | POA: Diagnosis not present

## 2018-12-14 DIAGNOSIS — Z1283 Encounter for screening for malignant neoplasm of skin: Secondary | ICD-10-CM | POA: Diagnosis not present

## 2018-12-14 DIAGNOSIS — D1800 Hemangioma unspecified site: Secondary | ICD-10-CM | POA: Diagnosis not present

## 2018-12-14 DIAGNOSIS — L738 Other specified follicular disorders: Secondary | ICD-10-CM | POA: Diagnosis not present

## 2018-12-18 ENCOUNTER — Ambulatory Visit
Admission: RE | Admit: 2018-12-18 | Discharge: 2018-12-18 | Disposition: A | Discharge status: Home / Self Care | Payer: BC Managed Care – PPO | Source: Ambulatory Visit | Attending: Family Medicine | Admitting: Family Medicine

## 2018-12-18 ENCOUNTER — Ambulatory Visit: Payer: BC Managed Care – PPO | Admitting: Family Medicine

## 2018-12-18 ENCOUNTER — Other Ambulatory Visit: Payer: Self-pay

## 2018-12-18 ENCOUNTER — Encounter: Payer: Self-pay | Admitting: Family Medicine

## 2018-12-18 ENCOUNTER — Telehealth: Payer: Self-pay

## 2018-12-18 VITALS — BP 141/88 | HR 98 | Temp 96.8°F | Wt 138.6 lb

## 2018-12-18 DIAGNOSIS — M542 Cervicalgia: Secondary | ICD-10-CM

## 2018-12-18 DIAGNOSIS — R7989 Other specified abnormal findings of blood chemistry: Secondary | ICD-10-CM | POA: Diagnosis not present

## 2018-12-18 DIAGNOSIS — M436 Torticollis: Secondary | ICD-10-CM
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
## 2018-12-18 NOTE — Patient Instructions (Signed)
Acute Torticollis, Adult Torticollis is a condition in which the muscles of the neck tighten (contract) abnormally, causing the neck to twist and the head to move into an unnatural position. Torticollis that develops suddenly is called acute torticollis. People with acute torticollis may have trouble turning their head. The condition can be painful and may range from mild to severe. What are the causes? This condition may be caused by:  Sleeping in an awkward position (common).  Extending or twisting the neck muscles beyond their normal position.  An injury to the neck muscles.  An infection.  A tumor.  Certain medicines.  Long-lasting spasms of the neck muscles. In some cases, the cause may not be known. What increases the risk? You are more likely to develop this condition if:  You have a condition associated with loose ligaments, such as Down syndrome.  You have a brain condition that affects vision, such as strabismus. What are the signs or symptoms? The main symptom of this condition is tilting of the head to one side. Other symptoms include:  Pain in the neck.  Trouble turning the head from side to side or up and down. How is this diagnosed? This condition may be diagnosed based on:  A physical exam.  Your medical history.  Imaging tests, such as: ? An X-ray. ? An ultrasound. ? A CT scan. ? An MRI. How is this treated? Treatment for this condition depends on what is causing the condition. Mild cases may go away without treatment. Treatment for more serious cases may include:  Medicines or shots to relax the muscles.  Other medicines, such as antibiotics to treat the underlying cause.  Wearing a soft neck collar.  Physical therapy and stretching to improve neck strength and flexibility.  Neck massage. In severe cases, surgery may be needed to repair dislocated or broken bones or to treat nerves in the neck. Follow these instructions at home:   Take  over-the-counter and prescription medicines only as told by your health care provider.  Do stretching exercises and massage your neck as told by your health care provider.  If directed, apply heat to the affected area as often as told by your health care provider. Use the heat source that your health care provider recommends, such as a moist heat pack or a heating pad. ? Place a towel between your skin and the heat source. ? Leave the heat on for 20-30 minutes. ? Remove the heat if your skin turns bright red. This is especially important if you are unable to feel pain, heat, or cold. You may have a greater risk of getting burned.  If you wake up with torticollis after sleeping, check your bed or sleeping area. Look for lumpy pillows or unusual objects. Make sure your bed and sleeping area are comfortable.  Keep all follow-up visits as told by your health care provider. This is important. Contact a health care provider if:  You have a fever.  Your symptoms do not improve or they get worse. Get help right away if:  You have trouble breathing.  You develop noisy breathing (stridor).  You start to drool.  You have trouble swallowing or pain when swallowing.  You develop numbness or weakness in your hands or feet.  You have changes in your speech, understanding, or vision.  You are in severe pain.  You cannot move your head or neck. Summary  Torticollis is a condition in which the muscles of the neck tighten (contract) abnormally, causing   the neck to twist and the head to move into an unnatural position. Torticollis that develops suddenly is called acute torticollis.  Treatment for this condition depends on what is causing the condition. Mild cases may go away without treatment.  Do stretching exercises and massage your neck as told by your health care provider. You may also be instructed to apply heat to the area.  Contact your health care provider if your symptoms do not  improve or they get worse. This information is not intended to replace advice given to you by your health care provider. Make sure you discuss any questions you have with your health care provider. Document Released: 01/09/2000 Document Revised: 12/24/2016 Document Reviewed: 03/11/2016 Elsevier Patient Education  2020 Elsevier Inc.  

## 2018-12-18 NOTE — Telephone Encounter (Signed)
-----   Message from Virginia Crews, MD sent at 12/18/2018 12:37 PM EST ----- Neck x-ray shows degenerative/arthritic changes

## 2018-12-18 NOTE — Telephone Encounter (Signed)
Pt advised.   Thanks,   -Laura  

## 2018-12-19 ENCOUNTER — Telehealth: Payer: Self-pay

## 2018-12-19 LAB — HEPATIC FUNCTION PANEL
ALT: 30 IU/L (ref 0–32)
AST: 39 IU/L (ref 0–40)
Albumin: 4.4 g/dL (ref 3.8–4.8)
Alkaline Phosphatase: 96 IU/L (ref 39–117)
Bilirubin Total: 0.9 mg/dL (ref 0.0–1.2)
Bilirubin, Direct: 0.22 mg/dL (ref 0.00–0.40)
Total Protein: 6.9 g/dL (ref 6.0–8.5)

## 2018-12-19 NOTE — Telephone Encounter (Signed)
-----   Message from Virginia Crews, MD sent at 12/19/2018  8:15 AM EST ----- Normal labs

## 2018-12-19 NOTE — Telephone Encounter (Signed)
Left message advising pt.   Thanks,   -Laura  

## 2019-02-07 ENCOUNTER — Ambulatory Visit: Payer: Self-pay | Admitting: Family Medicine

## 2019-02-12 ENCOUNTER — Encounter: Payer: Self-pay | Admitting: Family Medicine

## 2019-02-14 ENCOUNTER — Ambulatory Visit: Payer: Medicare Other | Attending: Family Medicine

## 2019-02-14 DIAGNOSIS — Z23 Encounter for immunization: Secondary | ICD-10-CM

## 2019-02-14 NOTE — Progress Notes (Signed)
   Covid-19 Vaccination Clinic  Name:  Tracie Hall    MRN: [id number] DOB: 12/07/1955  02/14/2019  [name] was observed post Covid-19 immunization for 15 minutes without incidence. She was provided with Vaccine Information Sheet and instruction to access the V-Safe system.   [name] was instructed to call 911 with any severe reactions post vaccine: Marland Kitchen Difficulty breathing  . Swelling of your face and throat  . A fast heartbeat  . A bad rash all over your body  . Dizziness and weakness    Immunizations Administered    Name Date Dose VIS Date Route   Pfizer COVID-19 Vaccine 02/14/2019 11:26 AM 0.3 mL 01/05/2019 Intramuscular   Manufacturer: [place]   Lot: [id number]   [employer]: [id number]

## 2019-02-18 ENCOUNTER — Other Ambulatory Visit: Payer: Self-pay | Admitting: Family Medicine

## 2019-02-18 DIAGNOSIS — I10 Essential (primary) hypertension: Secondary | ICD-10-CM

## 2019-02-18 NOTE — Telephone Encounter (Signed)
Requested Prescriptions  Pending Prescriptions Disp Refills  . metoprolol succinate (TOPROL-XL) 50 MG 24 hr tablet [Pharmacy Med Name: METOPROLOL SUCC ER 50 MG TAB] 90 tablet 1    Sig: TAKE 1 TABLET BY MOUTH EVERY DAY     Cardiovascular:  Beta Blockers Failed - 02/18/2019  9:50 AM      Failed - Last BP in normal range    BP Readings from Last 1 Encounters:  12/18/18 (!) 141/88         Passed - Last Heart Rate in normal range    Pulse Readings from Last 1 Encounters:  12/18/18 98         Passed - Valid encounter within last 6 months    Recent Outpatient Visits          2 months ago Neck pain   [facility], Dionne Bucy, MD   3 months ago Encounter for annual physical exam   [facility] [place] [place], Dionne Bucy, MD   5 months ago Essential hypertension   [facility], Dionne Bucy, MD   6 months ago Essential hypertension   [facility] [place], Dionne Bucy, MD   6 months ago Adhesive capsulitis of left shoulder   [facility] [place], Dionne Bucy, MD      Future Appointments            In 2 months Bacigalupo, Dionne Bucy, MD [facility] [place]

## 2019-03-05 ENCOUNTER — Ambulatory Visit: Payer: Medicare HMO | Attending: Internal Medicine

## 2019-03-05 DIAGNOSIS — Z23 Encounter for immunization: Secondary | ICD-10-CM | POA: Insufficient documentation

## 2019-03-05 NOTE — Progress Notes (Signed)
   Covid-19 Vaccination Clinic  Name:  Tracie Hall    MRN: [id number] DOB: [birth year]  03/05/2019  [name] was observed post Covid-19 immunization for 15 minutes without incidence. She was provided with Vaccine Information Sheet and instruction to access the V-Safe system.   [name] was instructed to call 911 with any severe reactions post vaccine: Marland Kitchen Difficulty breathing  . Swelling of your face and throat  . A fast heartbeat  . A bad rash all over your body  . Dizziness and weakness    Immunizations Administered    Name Date Dose VIS Date Route   Pfizer COVID-19 Vaccine 03/05/2019  8:43 AM 0.3 mL 01/05/2019 Intramuscular   Manufacturer: [place]   Lot: [id number]   [place]: [id number]

## 2019-04-26 ENCOUNTER — Other Ambulatory Visit: Payer: Self-pay

## 2019-04-26 ENCOUNTER — Encounter: Payer: Self-pay | Admitting: Family Medicine

## 2019-04-26 ENCOUNTER — Ambulatory Visit (INDEPENDENT_AMBULATORY_CARE_PROVIDER_SITE_OTHER): Payer: Medicare HMO | Admitting: Family Medicine

## 2019-04-26 VITALS — BP 130/60 | HR 89 | Temp 96.6°F | Wt 141.0 lb

## 2019-04-26 DIAGNOSIS — I10 Essential (primary) hypertension: Secondary | ICD-10-CM | POA: Diagnosis not present

## 2019-04-26 DIAGNOSIS — Z23 Encounter for immunization: Secondary | ICD-10-CM

## 2019-04-26 DIAGNOSIS — Z1211 Encounter for screening for malignant neoplasm of colon: Secondary | ICD-10-CM | POA: Diagnosis not present

## 2019-04-26 DIAGNOSIS — R7989 Other specified abnormal findings of blood chemistry: Secondary | ICD-10-CM

## 2019-04-26 DIAGNOSIS — R7303 Prediabetes: Secondary | ICD-10-CM

## 2019-04-26 NOTE — Patient Instructions (Signed)

## 2019-04-26 NOTE — Assessment & Plan Note (Signed)
Slightly elevated on last check Etiology unclear Recheck CMP Further testing if remain elevated

## 2019-04-26 NOTE — Assessment & Plan Note (Signed)
Chronic and stable on last A1c Continue low carb diet Monitor A1c annually

## 2019-04-26 NOTE — Progress Notes (Signed)
Patient: Tracie Hall Female    DOB: 01-02-1956   64 y.o.   MRN: [id number] Visit Date: 04/27/2019  Today's Provider: Lavon Paganini, MD   Chief Complaint  Patient presents with  . Hypertension  . Hyperglycemia   Subjective:     HPI    Hypertension, follow-up:  BP Readings from Last 3 Encounters:  04/26/19 130/60  12/18/18 (!) 141/88  10/25/18 131/76    She was last seen for hypertension 6 months ago.  BP at that visit was 131/76. Management since that visit includes no changes. She reports excellent compliance with treatment. She is not having side effects.  She is exercising. She is adherent to low salt diet.   Outside blood pressures are 130's/70's. She is experiencing none.  Patient denies chest pain, dyspnea, exertional chest pressure/discomfort, fatigue and near-syncope.   Cardiovascular risk factors include advanced age (older than 3 for men, 58 for women) and hypertension.  Use of agents associated with hypertension: none.     Weight trend: stable Wt Readings from Last 3 Encounters:  04/26/19 141 lb (64 kg)  12/18/18 138 lb 9.6 oz (62.9 kg)  10/25/18 141 lb 6.4 oz (64.1 kg)    Current diet:Healthy  ------------------------------------------------------------------------   Hyperglycemia, Follow-up:   Lab Results  Component Value Date   HGBA1C 5.8 (H) 11/09/2018   HGBA1C 5.7 (H) 11/01/2017   HGBA1C 5.8 (H) 09/17/2016   GLUCOSE 98 11/09/2018   GLUCOSE 102 (H) 11/01/2017   GLUCOSE 100 (H) 10/13/2016    Last seen for for this 6 months ago.  Management since then includes no changes. Current symptoms include none and have been stable.  Weight trend: stable Current diet: in general, a "healthy" diet   Current exercise: walking  Pertinent Labs:    Component Value Date/Time   CHOL 202 (H) 11/09/2018 0802   TRIG 81 11/09/2018 0802   CHOLHDL 2.3 11/09/2018 0802   CREATININE 0.87 11/09/2018 0802    Wt Readings from Last 3  Encounters:  04/26/19 141 lb (64 kg)  12/18/18 138 lb 9.6 oz (62.9 kg)  10/25/18 141 lb 6.4 oz (64.1 kg)   ------------------------------------------------------------------------------------    No Known Allergies   Current Outpatient Medications:  .  CALCIUM-VITAMIN D PO, Take 600 mg by mouth daily., Disp: , Rfl:  .  conjugated estrogens (PREMARIN) vaginal cream, PLACE [id number] APPLICATORFULS VAGINALLY 2 (TWO) TIMES A WEEK., Disp: 30 g, Rfl: 2 .  metoprolol succinate (TOPROL-XL) 50 MG 24 hr tablet, TAKE 1 TABLET BY MOUTH EVERY DAY, Disp: 90 tablet, Rfl: 1 .  Multiple Vitamin (MULTIVITAMIN) capsule, Take 1 capsule by mouth daily., Disp: , Rfl:   Review of Systems  Constitutional: Negative.   Respiratory: Negative.   Cardiovascular: Negative.   Neurological: Negative for dizziness, light-headedness and headaches.    Social History   Tobacco Use  . Smoking status: Never Smoker  . Smokeless tobacco: Never Used  Substance Use Topics  . Alcohol use: Yes    Comment: occasional      Objective:   BP 130/60   Pulse 89   Temp (!) 96.6 F (35.9 C) (Temporal)   Wt 141 lb (64 kg)   BMI 23.46 kg/m  Vitals:   04/26/19 1611 04/26/19 1630  BP: (!) 153/81 130/60  Pulse: 89   Temp: (!) 96.6 F (35.9 C)   TempSrc: Temporal   Weight: 141 lb (64 kg)   Body mass index is 23.46 kg/m.   Physical Exam  Vitals reviewed.  Constitutional:      General: She is not in acute distress.    Appearance: Normal appearance. She is well-developed. She is not diaphoretic.  HENT:     Head: Normocephalic and atraumatic.  Eyes:     General: No scleral icterus.    Conjunctiva/sclera: Conjunctivae normal.  Neck:     Thyroid: No thyromegaly.  Cardiovascular:     Rate and Rhythm: Normal rate and regular rhythm.     Pulses: Normal pulses.     Heart sounds: Normal heart sounds. No murmur.  Pulmonary:     Effort: Pulmonary effort is normal. No respiratory distress.     Breath sounds: Normal breath  sounds. No wheezing, rhonchi or rales.  Musculoskeletal:     Cervical back: Neck supple.     Right lower leg: No edema.     Left lower leg: No edema.  Lymphadenopathy:     Cervical: No cervical adenopathy.  Skin:    General: Skin is warm and dry.  Neurological:     Mental Status: She is alert and oriented to person, place, and time. Mental status is at baseline.  Psychiatric:        Mood and Affect: Mood normal.        Behavior: Behavior normal.      No results found for any visits on 04/26/19.     Assessment & Plan    Problem List Items Addressed This Visit      Cardiovascular and Mediastinum   Hypertension - Primary    Well controlled on manual recheck Continue current medications Recheck metabolic panel F/u in 6 months       Relevant Orders   Comprehensive metabolic panel     Other   Prediabetes    Chronic and stable on last A1c Continue low carb diet Monitor A1c annually      Elevated LFTs    Slightly elevated on last check Etiology unclear Recheck CMP Further testing if remain elevated      Relevant Orders   Comprehensive metabolic panel    Other Visit Diagnoses    Screen for colon cancer       Relevant Orders   Ambulatory referral to Gastroenterology   Need for vaccination with 13-polyvalent pneumococcal conjugate vaccine       Relevant Orders   Pneumococcal conjugate vaccine 13-valent (Completed)       Return in about 3 months (around 07/26/2019) for Welcome to Medicare.   The entirety of the information documented in the History of Present Illness, Review of Systems and Physical Exam were personally obtained by me. Portions of this information were initially documented by Ashley Royalty, CMA and reviewed by me for thoroughness and accuracy.    Andrea Colglazier, Dionne Bucy, MD MPH [facility]

## 2019-04-26 NOTE — Assessment & Plan Note (Signed)
Well controlled on manual recheck Continue current medications Recheck metabolic panel F/u in 6 months  

## 2019-04-30 DIAGNOSIS — R7989 Other specified abnormal findings of blood chemistry: Secondary | ICD-10-CM | POA: Diagnosis not present

## 2019-04-30 DIAGNOSIS — R7303 Prediabetes: Secondary | ICD-10-CM | POA: Diagnosis not present

## 2019-04-30 DIAGNOSIS — H524 Presbyopia: Secondary | ICD-10-CM | POA: Diagnosis not present

## 2019-04-30 DIAGNOSIS — I10 Essential (primary) hypertension: Secondary | ICD-10-CM | POA: Diagnosis not present

## 2019-05-01 LAB — COMPREHENSIVE METABOLIC PANEL
ALT: 16 IU/L (ref 0–32)
AST: 18 IU/L (ref 0–40)
Albumin/Globulin Ratio: 1.8 (ref 1.2–2.2)
Albumin: 4.4 g/dL (ref 3.8–4.8)
Alkaline Phosphatase: 71 IU/L (ref 39–117)
BUN/Creatinine Ratio: 24 (ref 12–28)
BUN: 19 mg/dL (ref 8–27)
Bilirubin Total: 0.7 mg/dL (ref 0.0–1.2)
CO2: 23 mmol/L (ref 20–29)
Calcium: 9.6 mg/dL (ref 8.7–10.3)
Chloride: 102 mmol/L (ref 96–106)
Creatinine, Ser: 0.78 mg/dL (ref 0.57–1.00)
GFR calc Af Amer: 92 mL/min/{1.73_m2} (ref >59)
GFR calc non Af Amer: 80 mL/min/{1.73_m2} (ref >59)
Globulin, Total: 2.4 g/dL (ref 1.5–4.5)
Glucose: 108 mg/dL — ABNORMAL HIGH (ref 65–99)
Potassium: 3.7 mmol/L (ref 3.5–5.2)
Sodium: 141 mmol/L (ref 134–144)
Total Protein: 6.8 g/dL (ref 6.0–8.5)

## 2019-05-03 ENCOUNTER — Ambulatory Visit (INDEPENDENT_AMBULATORY_CARE_PROVIDER_SITE_OTHER): Payer: Self-pay | Admitting: Gastroenterology

## 2019-05-03 DIAGNOSIS — Z1211 Encounter for screening for malignant neoplasm of colon: Secondary | ICD-10-CM

## 2019-05-03 DIAGNOSIS — Z86010 Personal history of colonic polyps: Secondary | ICD-10-CM

## 2019-05-03 NOTE — Progress Notes (Signed)
Gastroenterology Pre-Procedure Review  Request Date: Monday June 7th, 2021 Requesting Physician: Dr. Allen Norris  PATIENT REVIEW QUESTIONS: The patient responded to the following health history questions as indicated:    1. Are you having any GI issues? no 2. Do you have a personal history of Polyps? yes (2016 with Dr. Allen Norris) 3. Do you have a family history of Colon Cancer or Polyps? no 4. Diabetes Mellitus? no 5. Joint replacements in the past 12 months?no 6. Major health problems in the past 3 months?no 7. Any artificial heart valves, MVP, or defibrillator?no    MEDICATIONS & ALLERGIES:    Patient reports the following regarding taking any anticoagulation/antiplatelet therapy:   Plavix, Coumadin, Eliquis, Xarelto, Lovenox, Pradaxa, Brilinta, or Effient? no Aspirin? no  Patient confirms/reports the following medications:  Current Outpatient Medications  Medication Sig Dispense Refill  . CALCIUM-VITAMIN D PO Take 600 mg by mouth daily.    Marland Kitchen conjugated estrogens (PREMARIN) vaginal cream PLACE [id number] APPLICATORFULS VAGINALLY 2 (TWO) TIMES A WEEK. 30 g 2  . metoprolol succinate (TOPROL-XL) 50 MG 24 hr tablet TAKE 1 TABLET BY MOUTH EVERY DAY 90 tablet 1  . Multiple Vitamin (MULTIVITAMIN) capsule Take 1 capsule by mouth daily.     No current facility-administered medications for this visit.    Patient confirms/reports the following allergies:  No Known Allergies  No orders of the defined types were placed in this encounter.   AUTHORIZATION INFORMATION Primary Insurance: 1D#: Group #:  Secondary Insurance: 1D#: Group #:  SCHEDULE INFORMATION: Date: Monday 07/02/19 Time: Location:[place]

## 2019-05-08 ENCOUNTER — Telehealth: Payer: Self-pay

## 2019-05-08 NOTE — Telephone Encounter (Signed)
-----   Message from Virginia Crews, MD sent at 05/07/2019  6:34 PM EDT ----- Normal labs

## 2019-05-08 NOTE — Telephone Encounter (Signed)
Left message advising pt (per DPR).   Thanks,   -Rhaelyn Giron  

## 2019-06-20 ENCOUNTER — Other Ambulatory Visit: Payer: Self-pay

## 2019-06-20 ENCOUNTER — Encounter: Payer: Self-pay | Admitting: Gastroenterology

## 2019-06-28 ENCOUNTER — Other Ambulatory Visit
Admission: RE | Admit: 2019-06-28 | Discharge: 2019-06-28 | Disposition: A | Discharge status: Home / Self Care | Payer: Medicare HMO | Source: Ambulatory Visit | Attending: Gastroenterology | Admitting: Gastroenterology

## 2019-06-28 ENCOUNTER — Other Ambulatory Visit: Payer: Self-pay

## 2019-06-28 DIAGNOSIS — Z20822 Contact with and (suspected) exposure to covid-19: Secondary | ICD-10-CM | POA: Insufficient documentation

## 2019-06-28 DIAGNOSIS — Z01812 Encounter for preprocedural laboratory examination: Secondary | ICD-10-CM | POA: Diagnosis not present

## 2019-06-28 LAB — SARS CORONAVIRUS 2 (TAT 6-24 HRS): SARS Coronavirus 2: NEGATIVE

## 2019-06-29 NOTE — Discharge Instructions (Signed)
General Anesthesia, Adult, Care After This sheet gives you information about how to care for yourself after your procedure. Your health care provider may also give you more specific instructions. If you have problems or questions, contact your health care provider. What can I expect after the procedure? After the procedure, the following side effects are common:  Pain or discomfort at the IV site.  Nausea.  Vomiting.  Sore throat.  Trouble concentrating.  Feeling cold or chills.  Weak or tired.  Sleepiness and fatigue.  Soreness and body aches. These side effects can affect parts of the body that were not involved in surgery. Follow these instructions at home:  For at least 24 hours after the procedure:  Have a responsible adult stay with you. It is important to have someone help care for you until you are awake and alert.  Rest as needed.  Do not: ? Participate in activities in which you could fall or become injured. ? Drive. ? Use heavy machinery. ? Drink alcohol. ? Take sleeping pills or medicines that cause drowsiness. ? Make important decisions or sign legal documents. ? Take care of children on your own. Eating and drinking  Follow any instructions from your health care provider about eating or drinking restrictions.  When you feel hungry, start by eating small amounts of foods that are soft and easy to digest (bland), such as toast. Gradually return to your regular diet.  Drink enough fluid to keep your urine pale yellow.  If you vomit, rehydrate by drinking water, juice, or clear broth. General instructions  If you have sleep apnea, surgery and certain medicines can increase your risk for breathing problems. Follow instructions from your health care provider about wearing your sleep device: ? Anytime you are sleeping, including during daytime naps. ? While taking prescription pain medicines, sleeping medicines, or medicines that make you drowsy.  Return to  your normal activities as told by your health care provider. Ask your health care provider what activities are safe for you.  Take over-the-counter and prescription medicines only as told by your health care provider.  If you smoke, do not smoke without supervision.  Keep all follow-up visits as told by your health care provider. This is important. Contact a health care provider if:  You have nausea or vomiting that does not get better with medicine.  You cannot eat or drink without vomiting.  You have pain that does not get better with medicine.  You are unable to pass urine.  You develop a skin rash.  You have a fever.  You have redness around your IV site that gets worse. Get help right away if:  You have difficulty breathing.  You have chest pain.  You have blood in your urine or stool, or you vomit blood. Summary  After the procedure, it is common to have a sore throat or nausea. It is also common to feel tired.  Have a responsible adult stay with you for the first 24 hours after general anesthesia. It is important to have someone help care for you until you are awake and alert.  When you feel hungry, start by eating small amounts of foods that are soft and easy to digest (bland), such as toast. Gradually return to your regular diet.  Drink enough fluid to keep your urine pale yellow.  Return to your normal activities as told by your health care provider. Ask your health care provider what activities are safe for you. This information is not   intended to replace advice given to you by your health care provider. Make sure you discuss any questions you have with your health care provider. Document Revised: 01/14/2017 Document Reviewed: 08/27/2016 Elsevier Patient Education  2020 Elsevier Inc.  

## 2019-07-02 ENCOUNTER — Ambulatory Visit: Payer: Medicare HMO | Admitting: Anesthesiology

## 2019-07-02 ENCOUNTER — Encounter: Payer: Self-pay | Admitting: Gastroenterology

## 2019-07-02 ENCOUNTER — Encounter
Admission: RE | Disposition: A | Discharge status: Home / Self Care | Payer: Self-pay | Source: Home / Self Care | Attending: Gastroenterology

## 2019-07-02 ENCOUNTER — Ambulatory Visit
Admission: RE | Admit: 2019-07-02 | Discharge: 2019-07-02 | Disposition: A | Discharge status: Home / Self Care | Payer: Medicare HMO | Attending: Gastroenterology | Admitting: Gastroenterology

## 2019-07-02 ENCOUNTER — Other Ambulatory Visit: Payer: Self-pay

## 2019-07-02 DIAGNOSIS — Z860100 Personal history of colon polyps, unspecified: Secondary | ICD-10-CM

## 2019-07-02 DIAGNOSIS — I10 Essential (primary) hypertension: Secondary | ICD-10-CM | POA: Insufficient documentation

## 2019-07-02 DIAGNOSIS — Z1211 Encounter for screening for malignant neoplasm of colon: Secondary | ICD-10-CM | POA: Diagnosis not present

## 2019-07-02 DIAGNOSIS — Z79890 Hormone replacement therapy (postmenopausal): Secondary | ICD-10-CM | POA: Insufficient documentation

## 2019-07-02 DIAGNOSIS — M1990 Unspecified osteoarthritis, unspecified site: Secondary | ICD-10-CM | POA: Diagnosis not present

## 2019-07-02 DIAGNOSIS — K640 First degree hemorrhoids: Secondary | ICD-10-CM | POA: Insufficient documentation

## 2019-07-02 DIAGNOSIS — Z79899 Other long term (current) drug therapy: Secondary | ICD-10-CM | POA: Insufficient documentation

## 2019-07-02 DIAGNOSIS — K5730 Diverticulosis of large intestine without perforation or abscess without bleeding: Secondary | ICD-10-CM | POA: Insufficient documentation

## 2019-07-02 HISTORY — PX: COLONOSCOPY WITH PROPOFOL: SHX5780

## 2019-07-02 HISTORY — DX: Unspecified osteoarthritis, unspecified site: M19.90

## 2019-07-02 SURGERY — COLONOSCOPY WITH PROPOFOL
Anesthesia: General | Site: Rectum

## 2019-07-02 MED ORDER — LACTATED RINGERS IV SOLN
10.0000 mL/h | INTRAVENOUS | Status: DC
  Administered 2019-07-02: 10 mL/h via INTRAVENOUS

## 2019-07-02 MED ORDER — STERILE WATER FOR IRRIGATION IR SOLN
Status: DC | PRN
  Administered 2019-07-02: 100 mL

## 2019-07-02 MED ORDER — ACETAMINOPHEN 160 MG/5ML PO SOLN: 325.0000 mg | Freq: Once | ORAL | Status: DC

## 2019-07-02 MED ORDER — LIDOCAINE HCL (CARDIAC) PF 100 MG/5ML IV SOSY
PREFILLED_SYRINGE | INTRAVENOUS | Status: DC | PRN
  Administered 2019-07-02: 30 mg via INTRAVENOUS

## 2019-07-02 MED ORDER — PROPOFOL 10 MG/ML IV BOLUS
INTRAVENOUS | Status: DC | PRN
  Administered 2019-07-02: 50 mg via INTRAVENOUS
  Administered 2019-07-02 (×2): 30 mg via INTRAVENOUS
  Administered 2019-07-02: 120 mg via INTRAVENOUS

## 2019-07-02 MED ORDER — ACETAMINOPHEN 325 MG PO TABS: 325.0000 mg | ORAL_TABLET | Freq: Once | ORAL | Status: DC

## 2019-07-02 SURGICAL SUPPLY — 24 items
CLIP HMST 235XBRD CATH ROT (MISCELLANEOUS) IMPLANT
CLIP RESOLUTION 360 11X235 (MISCELLANEOUS)
ELECT REM PT RETURN 9FT ADLT (ELECTROSURGICAL)
ELECTRODE REM PT RTRN 9FT ADLT (ELECTROSURGICAL) IMPLANT
FCP ESCP3.2XJMB 240X2.8X (MISCELLANEOUS)
FORCEPS BIOP RAD 4 LRG CAP 4 (CUTTING FORCEPS) IMPLANT
FORCEPS BIOP RJ4 240 W/NDL (MISCELLANEOUS)
FORCEPS ESCP3.2XJMB 240X2.8X (MISCELLANEOUS) IMPLANT
GOWN CVR UNV OPN BCK APRN NK (MISCELLANEOUS) ×2 IMPLANT
GOWN ISOL THUMB LOOP REG UNIV (MISCELLANEOUS) ×2
INJECTOR VARIJECT VIN23 (MISCELLANEOUS) IMPLANT
KIT DEFENDO VALVE AND CONN (KITS) IMPLANT
KIT ENDO PROCEDURE OLY (KITS) ×2 IMPLANT
MANIFOLD NEPTUNE II (INSTRUMENTS) IMPLANT
MARKER SPOT ENDO TATTOO 5ML (MISCELLANEOUS) IMPLANT
PROBE APC STR FIRE (PROBE) IMPLANT
RETRIEVER NET ROTH 2.5X230 LF (MISCELLANEOUS) IMPLANT
SNARE SHORT THROW 13M SML OVAL (MISCELLANEOUS) IMPLANT
SNARE SHORT THROW 30M LRG OVAL (MISCELLANEOUS) IMPLANT
SNARE SNG USE RND 15MM (INSTRUMENTS) IMPLANT
SPOT EX ENDOSCOPIC TATTOO (MISCELLANEOUS)
TRAP ETRAP POLY (MISCELLANEOUS) IMPLANT
VARIJECT INJECTOR VIN23 (MISCELLANEOUS)
WATER STERILE IRR 250ML POUR (IV SOLUTION) ×2 IMPLANT

## 2019-07-02 NOTE — Anesthesia Preprocedure Evaluation (Signed)
Anesthesia Evaluation  Patient identified by MRN, date of birth, ID band Patient awake    Reviewed: Allergy & Precautions, H&P , NPO status , Patient's Chart, lab work & pertinent test results  Airway Mallampati: II  TM Distance: >3 FB Neck ROM: full    Dental no notable dental hx.    Pulmonary    Pulmonary exam normal breath sounds clear to auscultation       Cardiovascular hypertension, Normal cardiovascular exam+ Valvular Problems/Murmurs  Rhythm:regular Rate:Normal     Neuro/Psych    GI/Hepatic   Endo/Other    Renal/GU      Musculoskeletal   Abdominal   Peds  Hematology   Anesthesia Other Findings   Reproductive/Obstetrics                             Anesthesia Physical Anesthesia Plan  ASA: II  Anesthesia Plan: General   Post-op Pain Management:    Induction: Intravenous  PONV Risk Score and Plan: 3 and Treatment may vary due to age or medical condition, TIVA and Propofol infusion  Airway Management Planned: Natural Airway  Additional Equipment:   Intra-op Plan:   Post-operative Plan:   Informed Consent: I have reviewed the patients History and Physical, chart, labs and discussed the procedure including the risks, benefits and alternatives for the proposed anesthesia with the patient or authorized representative who has indicated his/her understanding and acceptance.     Dental Advisory Given  Plan Discussed with: CRNA  Anesthesia Plan Comments:         Anesthesia Quick Evaluation

## 2019-07-02 NOTE — Transfer of Care (Signed)
Immediate Anesthesia Transfer of Care Note  Patient: Tracie Hall  Procedure(s) Performed: COLONOSCOPY WITH PROPOFOL (N/A Rectum)  Patient Location: PACU  Anesthesia Type: General  Level of Consciousness: awake, alert  and patient cooperative  Airway and Oxygen Therapy: Patient Spontanous Breathing and Patient connected to supplemental oxygen  Post-op Assessment: Post-op Vital signs reviewed, Patient's Cardiovascular Status Stable, Respiratory Function Stable, Patent Airway and No signs of Nausea or vomiting  Post-op Vital Signs: Reviewed and stable  Complications: No apparent anesthesia complications

## 2019-07-02 NOTE — Op Note (Signed)
[facility] Patient Name: Tracie Hall Procedure Date: 07/02/2019 9:57 AM MRN: [id number] Account #: [id number] Date of Birth: 06/26/1955 Admit Type: Outpatient Age: 64 Room: [facility] OR ROOM 01 Gender: Female Note Status: Finalized Procedure:             Colonoscopy Indications:           High risk colon cancer surveillance: Personal history                         of colonic polyps Providers:             Lucilla Lame MD, MD Referring MD:          Dionne Bucy. Bacigalupo (Referring MD) Medicines:             Propofol per Anesthesia Complications:         No immediate complications. Procedure:             Pre-Anesthesia Assessment:                        - Prior to the procedure, a History and Physical was                         performed, and patient medications and allergies were                         reviewed. The patient's tolerance of previous                         anesthesia was also reviewed. The risks and benefits                         of the procedure and the sedation options and risks                         were discussed with the patient. All questions were                         answered, and informed consent was obtained. Prior                         Anticoagulants: The patient has taken no previous                         anticoagulant or antiplatelet agents. ASA Grade                         Assessment: II - A patient with mild systemic disease.                         After reviewing the risks and benefits, the patient                         was deemed in satisfactory condition to undergo the                         procedure.  After obtaining informed consent, the colonoscope was                         passed under direct vision. Throughout the procedure,                         the patient's blood pressure, pulse, and oxygen                         saturations were monitored continuously. The                 Colonoscope was introduced through the anus and                         advanced to the the cecum, identified by appendiceal                         orifice and ileocecal valve. The colonoscopy was                         performed without difficulty. The patient tolerated                         the procedure well. The quality of the bowel                         preparation was excellent. Findings:      The perianal and digital rectal examinations were normal.      Multiple small-mouthed diverticula were found in the entire colon.      Non-bleeding internal hemorrhoids were found during retroflexion. The       hemorrhoids were Grade I (internal hemorrhoids that do not prolapse). Impression:            - Diverticulosis in the entire examined colon.                        - Non-bleeding internal hemorrhoids.                        - No specimens collected. Recommendation:        - Discharge patient to home.                        - Resume previous diet.                        - Continue present medications.                        - Repeat colonoscopy in 5 years for surveillance. Procedure Code(s):     --- Professional ---                        [phone], Colonoscopy, flexible; diagnostic, including                         collection of specimen(s) by brushing or washing, when                         performed (separate procedure) Diagnosis Code(s):     ---  Professional ---                        Z86.010, Personal history of colonic polyps CPT copyright 2019 American Medical Association. All rights reserved. The codes documented in this report are preliminary and upon coder review may  be revised to meet current compliance requirements. Lucilla Lame MD, MD 07/02/2019 10:22:03 AM This report has been signed electronically. Number of Addenda: 0 Note Initiated On: 07/02/2019 9:57 AM Scope Withdrawal Time: 0 hours 6 minutes 46 seconds  Total Procedure Duration: 0 hours 11 minutes 25  seconds  Estimated Blood Loss:  Estimated blood loss: none.      [facility]
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
## 2019-07-02 NOTE — Anesthesia Procedure Notes (Signed)
Date/Time: 07/02/2019 10:04 AM Performed by: Cameron Ali, CRNA Pre-anesthesia Checklist: Patient identified, Emergency Drugs available, Suction available, Timeout performed and Patient being monitored Patient Re-evaluated:Patient Re-evaluated prior to induction Oxygen Delivery Method: Nasal cannula Placement Confirmation: positive ETCO2

## 2019-07-02 NOTE — Anesthesia Postprocedure Evaluation (Signed)
Anesthesia Post Note  Patient: Tracie Hall  Procedure(s) Performed: COLONOSCOPY WITH PROPOFOL (N/A Rectum)     Patient location during evaluation: PACU Anesthesia Type: General Level of consciousness: awake and alert and oriented Pain management: satisfactory to patient Vital Signs Assessment: post-procedure vital signs reviewed and stable Respiratory status: spontaneous breathing, nonlabored ventilation and respiratory function stable Cardiovascular status: blood pressure returned to baseline and stable Postop Assessment: Adequate PO intake and No signs of nausea or vomiting Anesthetic complications: no    Raliegh Ip

## 2019-07-03 ENCOUNTER — Other Ambulatory Visit: Payer: Self-pay | Admitting: Obstetrics & Gynecology

## 2019-07-03 ENCOUNTER — Encounter: Payer: Self-pay | Admitting: *Deleted

## 2019-07-03 DIAGNOSIS — Z1231 Encounter for screening mammogram for malignant neoplasm of breast: Secondary | ICD-10-CM

## 2019-08-09 DIAGNOSIS — Z1211 Encounter for screening for malignant neoplasm of colon: Secondary | ICD-10-CM | POA: Diagnosis not present

## 2019-08-09 DIAGNOSIS — Z1231 Encounter for screening mammogram for malignant neoplasm of breast: Secondary | ICD-10-CM | POA: Diagnosis not present

## 2019-08-09 DIAGNOSIS — Z1331 Encounter for screening for depression: Secondary | ICD-10-CM | POA: Diagnosis not present

## 2019-08-09 DIAGNOSIS — Z124 Encounter for screening for malignant neoplasm of cervix: Secondary | ICD-10-CM | POA: Diagnosis not present

## 2019-08-17 ENCOUNTER — Other Ambulatory Visit: Payer: Self-pay | Admitting: Family Medicine

## 2019-08-17 DIAGNOSIS — I10 Essential (primary) hypertension: Secondary | ICD-10-CM

## 2019-09-14 ENCOUNTER — Ambulatory Visit (INDEPENDENT_AMBULATORY_CARE_PROVIDER_SITE_OTHER): Payer: Medicare HMO | Admitting: Family Medicine

## 2019-09-14 ENCOUNTER — Encounter: Payer: Self-pay | Admitting: Family Medicine

## 2019-09-14 ENCOUNTER — Other Ambulatory Visit: Payer: Self-pay

## 2019-09-14 VITALS — BP 133/79 | HR 86 | Temp 98.0°F | Resp 16 | Ht 65.0 in | Wt 135.1 lb

## 2019-09-14 DIAGNOSIS — Z0000 Encounter for general adult medical examination without abnormal findings: Secondary | ICD-10-CM | POA: Diagnosis not present

## 2019-09-14 NOTE — Progress Notes (Signed)
Annual Wellness Visit     Patient: Tracie Hall, Female    DOB: Jun 17, 1955, 64 y.o.   MRN: [id number] Visit Date: 09/14/2019  Today's Provider: Lavon Paganini, MD   Chief Complaint  Patient presents with  . Medicare Wellness   I,Sulibeya S Dimas,acting as a scribe for Lavon Paganini, MD.,have documented all relevant documentation on the behalf of Lavon Paganini, MD,as directed by  Lavon Paganini, MD while in the presence of Lavon Paganini, MD.  Subjective    Tracie Hall is a 64 y.o. female who presents today for her Annual Wellness Visit. She reports consuming a general diet. Home exercise routine includes treadmill and weights. She generally feels well. She reports sleeping well. She does not have additional problems to discuss today.   HPI  06/20/2018 Pap-Negative 10/20/2018 Mammogram-BI-RADS 1 07/02/2019 Colonoscopy-Diverticulosis  Patient Active Problem List   Diagnosis Date Noted  . Personal history of colonic polyps   . Elevated LFTs 04/26/2019  . Routine general medical examination at a health care facility 11/08/2018  . Palpitations 12/20/2017  . Varicose veins of both lower extremities 11/07/2017  . Prediabetes 10/20/2017  . Paresthesias 09/14/2016  . Vitamin B 12 deficiency 07/14/2015  . Adaptation reaction 05/21/2015  . Cervical osteoarthritis 05/21/2015  . Benign essential tremor 05/21/2015  . Personal history of other diseases of the musculoskeletal system and connective tissue 05/21/2015  . Cardiac murmur 05/21/2015  . Phlebectasia 05/21/2015  . Avitaminosis D 05/21/2015  . Stenosis of vagina 05/07/2015  . Vaginal atrophy 05/07/2015  . Menopause 05/07/2015  . Hypertension 08/19/2014   Social History   Tobacco Use  . Smoking status: Never Smoker  . Smokeless tobacco: Never Used  Vaping Use  . Vaping Use: Never used  Substance Use Topics  . Alcohol use: Yes    Alcohol/week: 1.0 standard drink    Types: 1 Glasses of wine  per week    Comment: occasional  . Drug use: No   No Known Allergies     Medications: Outpatient Medications Prior to Visit  Medication Sig  . CALCIUM-VITAMIN D PO Take 600 mg by mouth daily.  Marland Kitchen conjugated estrogens (PREMARIN) vaginal cream PLACE 3.47 APPLICATORFULS VAGINALLY 2 (TWO) TIMES A WEEK.  . metoprolol succinate (TOPROL-XL) 50 MG 24 hr tablet TAKE 1 TABLET BY MOUTH EVERY DAY  . Multiple Vitamin (MULTIVITAMIN) capsule Take 1 capsule by mouth daily. (Patient not taking: Reported on 09/14/2019)   No facility-administered medications prior to visit.    No Known Allergies  Patient Care Team: Virginia Crews, MD as PCP - General (Family Medicine)  Review of Systems  Constitutional: Negative.   HENT: Positive for postnasal drip.   Eyes: Negative.   Respiratory: Negative.   Cardiovascular: Negative.   Gastrointestinal: Negative.   Endocrine: Negative.   Genitourinary: Negative.   Musculoskeletal: Negative.   Skin: Negative.   Allergic/Immunologic: Negative.   Neurological: Negative.   Hematological: Negative.   Psychiatric/Behavioral: Negative.     Last CBC Lab Results  Component Value Date   WBC 5.3 11/09/2018   HGB 13.7 11/09/2018   HCT 41.1 11/09/2018   MCV 90 11/09/2018   MCH 30.0 11/09/2018   RDW 12.1 11/09/2018   PLT 205 42/59/5638   Last metabolic panel Lab Results  Component Value Date   GLUCOSE 108 (H) 04/30/2019   NA 141 04/30/2019   K 3.7 04/30/2019   CL 102 04/30/2019   CO2 23 04/30/2019   BUN 19 04/30/2019   CREATININE  0.78 04/30/2019   GFRNONAA 80 04/30/2019   GFRAA 92 04/30/2019   CALCIUM 9.6 04/30/2019   PROT 6.8 04/30/2019   ALBUMIN 4.4 04/30/2019   LABGLOB 2.4 04/30/2019   AGRATIO 1.8 04/30/2019   BILITOT 0.7 04/30/2019   ALKPHOS 71 04/30/2019   AST 18 04/30/2019   ALT 16 04/30/2019   Last lipids Lab Results  Component Value Date   CHOL 202 (H) 11/09/2018   HDL 87 11/09/2018   LDLCALC 101 (H) 11/09/2018   TRIG 81  11/09/2018   CHOLHDL 2.3 11/09/2018   Last thyroid functions Lab Results  Component Value Date   TSH 3.530 11/09/2018      Objective    Vitals: BP 133/79 (BP Location: Left Arm, Patient Position: Sitting, Cuff Size: Normal)   Pulse 86   Temp 98 F (36.7 C) (Oral)   Resp 16   Ht 5\' 5"  (1.651 m)   Wt 135 lb 1.6 oz (61.3 kg)   BMI 22.48 kg/m  BP Readings from Last 3 Encounters:  09/14/19 133/79  07/02/19 127/74  04/26/19 130/60   Wt Readings from Last 3 Encounters:  09/14/19 135 lb 1.6 oz (61.3 kg)  07/02/19 133 lb 6.4 oz (60.5 kg)  04/26/19 141 lb (64 kg)      Physical Exam Vitals reviewed.  Constitutional:      General: She is not in acute distress.    Appearance: Normal appearance. She is well-developed. She is not diaphoretic.  HENT:     Head: Normocephalic and atraumatic.     Right Ear: Tympanic membrane, ear canal and external ear normal.     Left Ear: Tympanic membrane, ear canal and external ear normal.     Nose: Nose normal.     Mouth/Throat:     Mouth: Mucous membranes are moist.     Pharynx: Oropharynx is clear. Posterior oropharyngeal erythema present. No oropharyngeal exudate.  Eyes:     General: No scleral icterus.    Conjunctiva/sclera: Conjunctivae normal.     Pupils: Pupils are equal, round, and reactive to light.  Neck:     Thyroid: No thyromegaly.  Cardiovascular:     Rate and Rhythm: Normal rate and regular rhythm.     Pulses: Normal pulses.     Heart sounds: Normal heart sounds. No murmur heard.   Pulmonary:     Effort: Pulmonary effort is normal. No respiratory distress.     Breath sounds: Normal breath sounds. No wheezing or rales.  Abdominal:     General: There is no distension.     Palpations: Abdomen is soft.     Tenderness: There is no abdominal tenderness.  Musculoskeletal:        General: No deformity.     Cervical back: Neck supple.     Right lower leg: No edema.     Left lower leg: No edema.  Lymphadenopathy:      Cervical: No cervical adenopathy.  Skin:    General: Skin is warm and dry.     Findings: No rash.  Neurological:     Mental Status: She is alert and oriented to person, place, and time. Mental status is at baseline.     Sensory: No sensory deficit.     Motor: No weakness.     Gait: Gait normal.  Psychiatric:        Mood and Affect: Mood normal.        Behavior: Behavior normal.        Thought Content: Thought  content normal.     Most recent functional status assessment: In your present state of health, do you have any difficulty performing the following activities: 09/14/2019  Hearing? N  Vision? N  Difficulty concentrating or making decisions? N  Walking or climbing stairs? N  Dressing or bathing? N  Doing errands, shopping? N  Some recent data might be hidden   Most recent fall risk assessment: Fall Risk  09/14/2019  Falls in the past year? 0  Number falls in past yr: 0  Injury with Fall? 0  Risk for fall due to : No Fall Risks  Follow up Falls evaluation completed    Most recent depression screenings: PHQ 2/9 Scores 09/14/2019 10/25/2018  PHQ - 2 Score 0 0  PHQ- 9 Score 0 0   Most recent cognitive screening: 6CIT Screen 09/14/2019  What Year? 0 points  What month? 0 points  What time? 0 points  Count back from 20 0 points  Months in reverse 0 points  Repeat phrase 4 points  Total Score 4   Most recent Audit-C alcohol use screening Alcohol Use Disorder Test (AUDIT) 09/14/2019  1. How often do you have a drink containing alcohol? 2  2. How many drinks containing alcohol do you have on a typical day when you are drinking? 0  3. How often do you have six or more drinks on one occasion? 0  AUDIT-C Score 2  Alcohol Brief Interventions/Follow-up AUDIT Score <7 follow-up not indicated   A score of 3 or more in women, and 4 or more in men indicates increased risk for alcohol abuse, EXCEPT if all of the points are from question 1   No results found for any visits on  09/14/19.  Assessment & Plan     Annual wellness visit done today including the all of the following: Reviewed patient's Family Medical History Reviewed and updated list of patient's medical providers Assessment of cognitive impairment was done Assessed patient's functional ability Established a written schedule for health screening [place] Completed and Reviewed  Exercise Activities and Dietary recommendations Goals   None     Immunization History  Administered Date(s) Administered  . Influenza,inj,Quad PF,6+ Mos 10/20/2017, 10/25/2018  . Influenza-Unspecified 10/21/2015, 10/18/2016  . PFIZER SARS-COV-2 Vaccination 02/14/2019, 03/05/2019  . Pneumococcal Conjugate-13 04/26/2019  . Td 10/25/2018  . Tdap 05/29/2008  . Zoster 11/12/2014    Health Maintenance  Topic Date Due  . INFLUENZA VACCINE  08/26/2019  . PNA vac Low Risk Adult (2 of 2 - PPSV23) 04/25/2020  . MAMMOGRAM  10/19/2020  . PAP SMEAR-Modifier  06/19/2021  . COLONOSCOPY  07/01/2024  . TETANUS/TDAP  10/24/2028  . DEXA SCAN  Completed  . COVID-19 Vaccine  Completed  . Hepatitis C Screening  Completed  . HIV Screening  Completed     Discussed health benefits of physical activity, and encouraged her to engage in regular exercise appropriate for her age and condition.     Return in about 1 month (around 10/15/2019) for chronic disease f/u, as scheduled.     I, Lavon Paganini, MD, have reviewed all documentation for this visit. The documentation on 09/14/19 for the exam, diagnosis, procedures, and orders are all accurate and complete.   Dillan Lunden, Dionne Bucy, MD, MPH [facility]

## 2019-09-14 NOTE — Patient Instructions (Signed)
Preventive Care 38 Years and Older, Female Preventive care refers to lifestyle choices and visits with your health care provider that can promote health and wellness. This includes:  A yearly physical exam. This is also called an annual well check.  Regular dental and eye exams.  Immunizations.  Screening for certain conditions.  Healthy lifestyle choices, such as diet and exercise. What can I expect for my preventive care visit? Physical exam Your health care provider will check:  Height and weight. These may be used to calculate body mass index (BMI), which is a measurement that tells if you are at a healthy weight.  Heart rate and blood pressure.  Your skin for abnormal spots. Counseling Your health care provider may ask you questions about:  Alcohol, tobacco, and drug use.  Emotional well-being.  Home and relationship well-being.  Sexual activity.  Eating habits.  History of falls.  Memory and ability to understand (cognition).  Work and work Statistician.  Pregnancy and menstrual history. What immunizations do I need?  Influenza (flu) vaccine  This is recommended every year. Tetanus, diphtheria, and pertussis (Tdap) vaccine  You may need a Td booster every 10 years. Varicella (chickenpox) vaccine  You may need this vaccine if you have not already been vaccinated. Zoster (shingles) vaccine  You may need this after age 33. Pneumococcal conjugate (PCV13) vaccine  One dose is recommended after age 33. Pneumococcal polysaccharide (PPSV23) vaccine  One dose is recommended after age 72. Measles, mumps, and rubella (MMR) vaccine  You may need at least one dose of MMR if you were born in 1957 or later. You may also need a second dose. Meningococcal conjugate (MenACWY) vaccine  You may need this if you have certain conditions. Hepatitis A vaccine  You may need this if you have certain conditions or if you travel or work in places where you may be exposed  to hepatitis A. Hepatitis B vaccine  You may need this if you have certain conditions or if you travel or work in places where you may be exposed to hepatitis B. Haemophilus influenzae type b (Hib) vaccine  You may need this if you have certain conditions. You may receive vaccines as individual doses or as more than one vaccine together in one shot (combination vaccines). Talk with your health care provider about the risks and benefits of combination vaccines. What tests do I need? Blood tests  Lipid and cholesterol levels. These may be checked every 5 years, or more frequently depending on your overall health.  Hepatitis C test.  Hepatitis B test. Screening  Lung cancer screening. You may have this screening every year starting at age 39 if you have a 30-pack-year history of smoking and currently smoke or have quit within the past 15 years.  Colorectal cancer screening. All adults should have this screening starting at age 36 and continuing until age 15. Your health care provider may recommend screening at age 23 if you are at increased risk. You will have tests every 1-10 years, depending on your results and the type of screening test.  Diabetes screening. This is done by checking your blood sugar (glucose) after you have not eaten for a while (fasting). You may have this done every 1-3 years.  Mammogram. This may be done every 1-2 years. Talk with your health care provider about how often you should have regular mammograms.  BRCA-related cancer screening. This may be done if you have a family history of breast, ovarian, tubal, or peritoneal cancers.  Other tests  Sexually transmitted disease (STD) testing.  Bone density scan. This is done to screen for osteoporosis. You may have this done starting at age 44. Follow these instructions at home: Eating and drinking  Eat a diet that includes fresh fruits and vegetables, whole grains, lean protein, and low-fat dairy products. Limit  your intake of foods with high amounts of sugar, saturated fats, and salt.  Take vitamin and mineral supplements as recommended by your health care provider.  Do not drink alcohol if your health care provider tells you not to drink.  If you drink alcohol: ? Limit how much you have to 0-1 drink a day. ? Be aware of how much alcohol is in your drink. In the [place] one drink equals one 12 oz bottle of beer (355 mL), one 5 oz glass of wine (148 mL), or one 1 oz glass of hard liquor (44 mL). Lifestyle  Take daily care of your teeth and gums.  Stay active. Exercise for at least 30 minutes on 5 or more days each week.  Do not use any products that contain nicotine or tobacco, such as cigarettes, e-cigarettes, and chewing tobacco. If you need help quitting, ask your health care provider.  If you are sexually active, practice safe sex. Use a condom or other form of protection in order to prevent STIs (sexually transmitted infections).  Talk with your health care provider about taking a low-dose aspirin or statin. What's next?  Go to your health care provider once a year for a well check visit.  Ask your health care provider how often you should have your eyes and teeth checked.  Stay up to date on all vaccines. This information is not intended to replace advice given to you by your health care provider. Make sure you discuss any questions you have with your health care provider. Document Revised: 01/05/2018 Document Reviewed: 01/05/2018 Elsevier Patient Education  2020 [employer].

## 2019-09-24 ENCOUNTER — Ambulatory Visit
Admission: EM | Admit: 2019-09-24 | Discharge: 2019-09-24 | Disposition: A | Discharge status: Home / Self Care | Payer: Medicare HMO | Attending: Family Medicine | Admitting: Family Medicine

## 2019-09-24 DIAGNOSIS — J0110 Acute frontal sinusitis, unspecified: Secondary | ICD-10-CM | POA: Diagnosis not present

## 2019-09-24 MED ORDER — CETIRIZINE HCL 10 MG PO TABS
10.0000 mg | ORAL_TABLET | Freq: Every day | ORAL | 0 refills | Status: DC
Start: 2019-09-24 — End: 2023-01-04

## 2019-09-24 MED ORDER — AMOXICILLIN-POT CLAVULANATE 875-125 MG PO TABS
1.0000 | ORAL_TABLET | Freq: Two times a day (BID) | ORAL | 0 refills | Status: DC
Start: 2019-09-24 — End: 2019-10-08
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
## 2019-09-24 NOTE — ED Triage Notes (Signed)
Patient c/o nasal congestion and bilateral ear fullness x2.5 weeks. Reports she sometimes experiences pain in bilateral ears. Denies cough, ShOB, headache. Patient is fully vaccinated, declines covid testing.

## 2019-09-24 NOTE — Discharge Instructions (Signed)
Treating you for a sinus infection Take the medicine as prescribed Rest, hydrate Follow up as needed for continued or worsening symptoms

## 2019-10-03 DIAGNOSIS — R69 Illness, unspecified: Secondary | ICD-10-CM | POA: Diagnosis not present

## 2019-10-05 ENCOUNTER — Other Ambulatory Visit: Payer: Self-pay

## 2019-10-05 ENCOUNTER — Encounter: Payer: Self-pay | Admitting: Family Medicine

## 2019-10-05 ENCOUNTER — Ambulatory Visit (INDEPENDENT_AMBULATORY_CARE_PROVIDER_SITE_OTHER): Payer: Medicare HMO | Admitting: Family Medicine

## 2019-10-05 VITALS — Temp 97.9°F | Resp 16 | Wt 134.0 lb

## 2019-10-05 DIAGNOSIS — T50Z95A Adverse effect of other vaccines and biological substances, initial encounter: Secondary | ICD-10-CM

## 2019-10-05 DIAGNOSIS — H9203 Otalgia, bilateral: Secondary | ICD-10-CM

## 2019-10-05 NOTE — Progress Notes (Signed)
I,[name],acting as a scribe for Lavon Paganini, MD.,have documented all relevant documentation on the behalf of Lavon Paganini, MD,as directed by  Lavon Paganini, MD while in the presence of Lavon Paganini, MD.   Established patient visit   Patient: Tracie Hall   DOB: 03/14/1955   64 y.o. Female  MRN: [id number] Visit Date: 10/05/2019  Today's healthcare provider: Lavon Paganini, MD   Chief Complaint  Patient presents with  . Ear Pain   Subjective    HPI  Patient was seen in office 09/14/2019 and had some pain in her ears at that time. Patient states since then she went to the walk in Clinic and was treated with antibiotic, which she has finished. Patient states she is still having intermittent pain in her ears.   Also patient had a shingle and flu shot done 2 days ago. Her right arm at injection site is red and has heat.    Social History   Tobacco Use  . Smoking status: Never Smoker  . Smokeless tobacco: Never Used  Vaping Use  . Vaping Use: Never used  Substance Use Topics  . Alcohol use: Yes    Alcohol/week: 1.0 standard drink    Types: 1 Glasses of wine per week    Comment: occasional  . Drug use: No       Medications: Outpatient Medications Prior to Visit  Medication Sig  . CALCIUM-VITAMIN D PO Take 600 mg by mouth daily.  . cetirizine (ZYRTEC) 10 MG tablet Take 1 tablet (10 mg total) by mouth daily.  Marland Kitchen conjugated estrogens (PREMARIN) vaginal cream PLACE 9.98 APPLICATORFULS VAGINALLY 2 (TWO) TIMES A WEEK.  . metoprolol succinate (TOPROL-XL) 50 MG 24 hr tablet TAKE 1 TABLET BY MOUTH EVERY DAY  . amoxicillin-clavulanate (AUGMENTIN) 875-125 MG tablet Take 1 tablet by mouth every 12 (twelve) hours. (Patient not taking: Reported on 10/05/2019)  . Multiple Vitamin (MULTIVITAMIN) capsule Take 1 capsule by mouth daily. (Patient not taking: Reported on 09/14/2019)   No facility-administered medications prior to visit.    Review of Systems    Constitutional: Negative.   HENT: Positive for ear pain. Negative for congestion, ear discharge, hearing loss, sinus pressure and sinus pain.   Respiratory: Negative.   Cardiovascular: Negative.   Genitourinary: Negative.   Skin: Positive for color change. Negative for wound.  Neurological: Negative.        Objective    Temp 97.9 F (36.6 C) (Oral)   Resp 16   Wt 134 lb (60.8 kg)   SpO2 96%   BMI 22.30 kg/m     Physical Exam Vitals reviewed.  Constitutional:      General: She is not in acute distress.    Appearance: Normal appearance. She is well-developed.  HENT:     Head: Normocephalic and atraumatic.     Right Ear: Tympanic membrane, ear canal and external ear normal.     Left Ear: Tympanic membrane, ear canal and external ear normal.  Eyes:     General: No scleral icterus.    Conjunctiva/sclera: Conjunctivae normal.  Cardiovascular:     Rate and Rhythm: Normal rate and regular rhythm.     Heart sounds: No murmur heard.   Pulmonary:     Effort: Pulmonary effort is normal. No respiratory distress.     Breath sounds: Normal breath sounds. No wheezing.  Musculoskeletal:     Right lower leg: No edema.     Left lower leg: No edema.  Skin:  General: Skin is warm and dry.     Findings: Erythema (of RUE over deltoid and distally) present.  Neurological:     Mental Status: She is alert and oriented to person, place, and time. Mental status is at baseline.  Psychiatric:        Mood and Affect: Mood normal.        Behavior: Behavior normal.      No results found for any visits on 10/05/19.  Assessment & Plan     1. Otalgia of both ears - Normal TMs on exam today - suspect eustachian tube dysfunction causing residual mild otalgia - continue flonase and antihistamine  2. Adverse effect of vaccine, initial encounter - seems most likely to be from Shingrix - discussed this is a common side effect - use antihistamine and ice pack - monitor redness - no signs  of infectious process - return precautions discussed     Return if symptoms worsen or fail to improve.      I, Lavon Paganini, MD, have reviewed all documentation for this visit. The documentation on 10/08/19 for the exam, diagnosis, procedures, and orders are all accurate and complete.   [name], Dionne Bucy, MD, MPH [facility]

## 2019-10-22 ENCOUNTER — Other Ambulatory Visit: Payer: Self-pay

## 2019-10-22 ENCOUNTER — Ambulatory Visit
Admission: RE | Admit: 2019-10-22 | Discharge: 2019-10-22 | Disposition: A | Discharge status: Home / Self Care | Payer: Medicare HMO | Source: Ambulatory Visit | Attending: Obstetrics & Gynecology | Admitting: Obstetrics & Gynecology

## 2019-10-22 DIAGNOSIS — Z1231 Encounter for screening mammogram for malignant neoplasm of breast: Secondary | ICD-10-CM | POA: Diagnosis not present

## 2019-10-23 ENCOUNTER — Telehealth: Payer: Self-pay

## 2019-10-23 NOTE — Telephone Encounter (Signed)
-----   Message from Virginia Crews, MD sent at 10/23/2019 12:44 PM EDT ----- Normal mammogram. Repeat in 1 yr

## 2019-10-23 NOTE — Telephone Encounter (Signed)
Written by Virginia Crews, MD on 10/23/2019 12:44 PM EDT Seen by patient Tracie Hall on 10/23/2019 1:46 PM

## 2019-10-30 ENCOUNTER — Encounter: Payer: Self-pay | Admitting: Family Medicine

## 2019-11-01 ENCOUNTER — Telehealth: Payer: Self-pay

## 2019-11-01 NOTE — Telephone Encounter (Signed)
Please review. Thanks!  

## 2019-11-01 NOTE — Telephone Encounter (Signed)
Appt scheduled

## 2019-11-01 NOTE — Telephone Encounter (Signed)
Copied from [place] [id number]. Topic: Appointment Scheduling - Scheduling Inquiry for Clinic >> Nov 01, 2019  8:59 AM Scherrie Gerlach wrote: Reason for CRM: pt having throat issue for over a month.  She states she was treated for a sinus infection, ear infection. Pt states her throat has continued to worsen.  She has appt with ENT next Thursday, (and Dr B).  But she is concerned she may need abx. She also concerned her glands getting infected.  Pt wants in person visit for evaluation. Please advise

## 2019-11-01 NOTE — Telephone Encounter (Signed)
Its fine

## 2019-11-02 ENCOUNTER — Ambulatory Visit (INDEPENDENT_AMBULATORY_CARE_PROVIDER_SITE_OTHER): Payer: Medicare HMO | Admitting: Physician Assistant

## 2019-11-02 ENCOUNTER — Encounter: Payer: Self-pay | Admitting: Physician Assistant

## 2019-11-02 ENCOUNTER — Other Ambulatory Visit: Payer: Self-pay

## 2019-11-02 VITALS — BP 152/71 | HR 78 | Temp 98.5°F | Resp 16 | Ht 65.0 in | Wt 134.2 lb

## 2019-11-02 DIAGNOSIS — J029 Acute pharyngitis, unspecified: Secondary | ICD-10-CM

## 2019-11-02 NOTE — Patient Instructions (Addendum)
Pepcid 20 mg one tablet daily 30 minutes before a meal daily

## 2019-11-02 NOTE — Progress Notes (Signed)
Established patient visit   Patient: Tracie Hall   DOB: 01/20/1956   64 y.o. Female  MRN: [id number] Visit Date: 11/02/2019  Today's healthcare provider: Trinna Post, PA-C   Chief Complaint  Patient presents with  . Sore Throat   Subjective    Sore Throat  This is a new problem. The current episode started 1 to 4 weeks ago (about 4 weeks). The problem has been unchanged. There has been no fever. Pertinent negatives include no congestion, coughing or trouble swallowing.    Patient reports that she was seen at the urgent care about 4 weeks and was diagnosed with an ear infection/sinus infection. She was treated with amoxil and reports that symptoms got a little better.She denies difficulty swallowing, denies fever, denies cough. She describes it as a tenderness in her throat.  She was seen again in this office on 10/05/2019 and was diagnosed with eustachian tube dysfunction and was advised to use allergy medicine and flonase. These did not help her sore throat. Initially the antibiotic cleared the congestion but now has continued sore throat.      Medications: Outpatient Medications Prior to Visit  Medication Sig  . CALCIUM-VITAMIN D PO Take 600 mg by mouth daily.  Marland Kitchen conjugated estrogens (PREMARIN) vaginal cream PLACE 9.38 APPLICATORFULS VAGINALLY 2 (TWO) TIMES A WEEK.  . metoprolol succinate (TOPROL-XL) 50 MG 24 hr tablet TAKE 1 TABLET BY MOUTH EVERY DAY  . cetirizine (ZYRTEC) 10 MG tablet Take 1 tablet (10 mg total) by mouth daily. (Patient not taking: Reported on 11/02/2019)   No facility-administered medications prior to visit.    Review of Systems  Constitutional: Negative.   HENT: Positive for sore throat. Negative for congestion, facial swelling, postnasal drip, sinus pressure, sinus pain, trouble swallowing and voice change.   Respiratory: Negative for cough.       Objective    BP (!) 152/71   Pulse 78   Temp 98.5 F (36.9 C)   Resp 16   Ht 5\' 5"   (1.651 m)   Wt 134 lb 3.2 oz (60.9 kg)   BMI 22.33 kg/m    Physical Exam Constitutional:      Appearance: She is well-developed.  HENT:     Right Ear: Tympanic membrane normal.     Left Ear: Tympanic membrane normal.     Mouth/Throat:     Mouth: Mucous membranes are moist.     Pharynx: Posterior oropharyngeal erythema present. No oropharyngeal exudate or uvula swelling.     Tonsils: No tonsillar exudate. 0 on the right. 0 on the left.     Comments: Some mild erythema in pharynx Cardiovascular:     Rate and Rhythm: Normal rate and regular rhythm.     Heart sounds: Normal heart sounds.  Pulmonary:     Effort: Pulmonary effort is normal.  Neurological:     Mental Status: She is alert.       No results found for any visits on 11/02/19.  Assessment & Plan    1. Sore throat  Allergy medicine was not helpful in improving symptoms, patient has discontinued and noticed no difference. She has an appointment with ENT next week. Suggest trial of Pepcid 20 mg QD 30 min before meal for acid reflux causing sore throat.      Return if symptoms worsen or fail to improve.      ITrinna Post, PA-C, have reviewed all documentation for this visit. The documentation on 11/02/19 for  the exam, diagnosis, procedures, and orders are all accurate and complete.  The entirety of the information documented in the History of Present Illness, Review of Systems and Physical Exam were personally obtained by me. Portions of this information were initially documented by Wilburt Finlay, CMA and reviewed by me for thoroughness and accuracy.     Paulene Floor  [facility] [phone] (phone) [phone] (fax)  Page

## 2019-11-07 NOTE — Progress Notes (Signed)
Established patient visit   Patient: Tracie Hall   DOB: [birth year]   64 y.o. Female  MRN: [id number] Visit Date: 11/08/2019  Today's healthcare provider: Lavon Paganini, MD   Chief Complaint  Patient presents with  . Hypertension  . Pre-diabetes   I,Latasha Walston,acting as a scribe for Lavon Paganini, MD.,have documented all relevant documentation on the behalf of Lavon Paganini, MD,as directed by  Lavon Paganini, MD while in the presence of Lavon Paganini, MD.  Subjective    HPI  Hypertension, follow-up  BP Readings from Last 3 Encounters:  11/08/19 (!) 144/67  11/02/19 (!) 152/71  09/24/19 (!) 144/91   Wt Readings from Last 3 Encounters:  11/08/19 131 lb 3.2 oz (59.5 kg)  11/02/19 134 lb 3.2 oz (60.9 kg)  10/05/19 134 lb (60.8 kg)     She was last seen for hypertension 6 months ago.  BP at that visit was 130/60. Management since that visit includes no change.  She reports good compliance with treatment. She is not having side effects.  She is following a Regular diet. She is exercising. She does not smoke.  Use of agents associated with hypertension: estrogens.   Outside blood pressures are being checked at home. Symptoms: No chest pain No chest pressure  No palpitations No syncope  No dyspnea No orthopnea  No paroxysmal nocturnal dyspnea No lower extremity edema   Pertinent labs: Lab Results  Component Value Date   CHOL 202 (H) 11/09/2018   HDL 87 11/09/2018   LDLCALC 101 (H) 11/09/2018   TRIG 81 11/09/2018   CHOLHDL 2.3 11/09/2018   Lab Results  Component Value Date   NA 141 04/30/2019   K 3.7 04/30/2019   CREATININE 0.78 04/30/2019   GFRNONAA 80 04/30/2019   GFRAA 92 04/30/2019   GLUCOSE 108 (H) 04/30/2019     The 10-year ASCVD risk score Mikey Bussing DC Jr., et al., 2013) is: 7.8%   --------------------------------------------------------------------------------------------------- Prediabetes, Follow-up  Lab Results    Component Value Date   HGBA1C 5.8 (H) 11/09/2018   HGBA1C 5.7 (H) 11/01/2017   HGBA1C 5.8 (H) 09/17/2016   GLUCOSE 108 (H) 04/30/2019   GLUCOSE 98 11/09/2018   GLUCOSE 102 (H) 11/01/2017    Last seen for for this 6 months ago.  Management since that visit includes no change. Current symptoms include none  Current diet: in general, a "healthy" diet   Current exercise: walking  Pertinent Labs:    Component Value Date/Time   CHOL 202 (H) 11/09/2018 0802   TRIG 81 11/09/2018 0802   CHOLHDL 2.3 11/09/2018 0802   CREATININE 0.78 04/30/2019 0851    Wt Readings from Last 3 Encounters:  11/08/19 131 lb 3.2 oz (59.5 kg)  11/02/19 134 lb 3.2 oz (60.9 kg)  10/05/19 134 lb (60.8 kg)    -----------------------------------------------------------------------------------------      Medications: Outpatient Medications Prior to Visit  Medication Sig  . CALCIUM-VITAMIN D PO Take 600 mg by mouth daily.  . cetirizine (ZYRTEC) 10 MG tablet Take 1 tablet (10 mg total) by mouth daily.  Marland Kitchen conjugated estrogens (PREMARIN) vaginal cream PLACE 8.92 APPLICATORFULS VAGINALLY 2 (TWO) TIMES A WEEK.  . metoprolol succinate (TOPROL-XL) 50 MG 24 hr tablet TAKE 1 TABLET BY MOUTH EVERY DAY   No facility-administered medications prior to visit.    Review of Systems  Constitutional: Negative.   Respiratory: Negative.   Cardiovascular: Negative.   Endocrine: Negative.   Musculoskeletal: Negative.  Objective    BP (!) 144/67 (BP Location: Left Arm, Patient Position: Sitting, Cuff Size: Normal)   Pulse 93   Temp 98.5 F (36.9 C) (Oral)   Ht 5\' 5"  (1.651 m)   Wt 131 lb 3.2 oz (59.5 kg)   SpO2 99%   BMI 21.83 kg/m    Physical Exam Vitals reviewed.  Constitutional:      General: She is not in acute distress.    Appearance: Normal appearance. She is well-developed. She is not diaphoretic.  HENT:     Head: Normocephalic and atraumatic.  Eyes:     General: No scleral icterus.     Conjunctiva/sclera: Conjunctivae normal.  Neck:     Thyroid: No thyromegaly.  Cardiovascular:     Rate and Rhythm: Normal rate and regular rhythm.     Pulses: Normal pulses.     Heart sounds: Normal heart sounds. No murmur heard.   Pulmonary:     Effort: Pulmonary effort is normal. No respiratory distress.     Breath sounds: Normal breath sounds. No wheezing, rhonchi or rales.  Musculoskeletal:     Cervical back: Neck supple.     Right lower leg: No edema.     Left lower leg: No edema.  Lymphadenopathy:     Cervical: No cervical adenopathy.  Skin:    General: Skin is warm and dry.     Findings: No rash.  Neurological:     Mental Status: She is alert and oriented to person, place, and time. Mental status is at baseline.  Psychiatric:        Mood and Affect: Mood normal.        Behavior: Behavior normal.       No results found for any visits on 11/08/19.  Assessment & Plan     Problem List Items Addressed This Visit      Cardiovascular and Mediastinum   Hypertension - Primary    Well controlled on recheck Continue current medications Recheck metabolic panel F/u in 6 months       Relevant Orders   Comprehensive metabolic panel     Other   Avitaminosis D    Recheck Vit D level      Relevant Orders   VITAMIN D 25 Hydroxy (Vit-D Deficiency, Fractures)   Vitamin B 12 deficiency    Recheck B12 level      Relevant Orders   B12   Paresthesias   Relevant Orders   B12   Prediabetes    Encourage low carb diet Recheck A1c      Relevant Orders   Hemoglobin A1c    Other Visit Diagnoses    Pure hypercholesterolemia       Relevant Orders   Comprehensive metabolic panel   Lipid panel       Return in about 10 months (around 09/07/2020) for CPE, AWV, as scheduled.      I, Lavon Paganini, MD, have reviewed all documentation for this visit. The documentation on 11/08/19 for the exam, diagnosis, procedures, and orders are all accurate and  complete.   Salam Micucci, Dionne Bucy, MD, MPH [facility]

## 2019-11-08 ENCOUNTER — Other Ambulatory Visit: Payer: Self-pay

## 2019-11-08 ENCOUNTER — Encounter: Payer: Self-pay | Admitting: Family Medicine

## 2019-11-08 ENCOUNTER — Ambulatory Visit (INDEPENDENT_AMBULATORY_CARE_PROVIDER_SITE_OTHER): Payer: Medicare HMO | Admitting: Family Medicine

## 2019-11-08 VITALS — BP 144/67 | HR 93 | Temp 98.5°F | Ht 65.0 in | Wt 131.2 lb

## 2019-11-08 DIAGNOSIS — I10 Essential (primary) hypertension: Secondary | ICD-10-CM | POA: Diagnosis not present

## 2019-11-08 DIAGNOSIS — R202 Paresthesia of skin: Secondary | ICD-10-CM | POA: Diagnosis not present

## 2019-11-08 DIAGNOSIS — E559 Vitamin D deficiency, unspecified: Secondary | ICD-10-CM | POA: Diagnosis not present

## 2019-11-08 DIAGNOSIS — E538 Deficiency of other specified B group vitamins: Secondary | ICD-10-CM | POA: Diagnosis not present

## 2019-11-08 DIAGNOSIS — K219 Gastro-esophageal reflux disease without esophagitis: Secondary | ICD-10-CM | POA: Diagnosis not present

## 2019-11-08 DIAGNOSIS — R7303 Prediabetes: Secondary | ICD-10-CM | POA: Diagnosis not present

## 2019-11-08 DIAGNOSIS — E7800 Pure hypercholesterolemia, unspecified: Secondary | ICD-10-CM

## 2019-11-08 DIAGNOSIS — R070 Pain in throat: Secondary | ICD-10-CM | POA: Diagnosis not present

## 2019-11-08 NOTE — Assessment & Plan Note (Signed)
Encourage low carb diet Recheck A1c 

## 2019-11-08 NOTE — Assessment & Plan Note (Signed)
Recheck Vit D level 

## 2019-11-08 NOTE — Assessment & Plan Note (Signed)
Recheck B12 level 

## 2019-11-08 NOTE — Assessment & Plan Note (Signed)
Well controlled on recheck Continue current medications Recheck metabolic panel F/u in 6 months  

## 2019-11-09 LAB — COMPREHENSIVE METABOLIC PANEL
ALT: 14 IU/L (ref 0–32)
AST: 23 IU/L (ref 0–40)
Albumin/Globulin Ratio: 2 (ref 1.2–2.2)
Albumin: 4.4 g/dL (ref 3.8–4.8)
Alkaline Phosphatase: 66 IU/L (ref 44–121)
BUN/Creatinine Ratio: 17 (ref 12–28)
BUN: 12 mg/dL (ref 8–27)
Bilirubin Total: 0.9 mg/dL (ref 0.0–1.2)
CO2: 25 mmol/L (ref 20–29)
Calcium: 9.5 mg/dL (ref 8.7–10.3)
Chloride: 101 mmol/L (ref 96–106)
Creatinine, Ser: 0.71 mg/dL (ref 0.57–1.00)
GFR calc Af Amer: 103 mL/min/{1.73_m2} (ref >59)
GFR calc non Af Amer: 90 mL/min/{1.73_m2} (ref >59)
Globulin, Total: 2.2 g/dL (ref 1.5–4.5)
Glucose: 100 mg/dL — ABNORMAL HIGH (ref 65–99)
Potassium: 3.8 mmol/L (ref 3.5–5.2)
Sodium: 141 mmol/L (ref 134–144)
Total Protein: 6.6 g/dL (ref 6.0–8.5)

## 2019-11-09 LAB — VITAMIN B12: Vitamin B-12: 310 pg/mL (ref 232–1245)

## 2019-11-09 LAB — LIPID PANEL
Chol/HDL Ratio: 2.1 ratio (ref 0.0–4.4)
Cholesterol, Total: 175 mg/dL (ref 100–199)
HDL: 84 mg/dL (ref >39)
LDL Chol Calc (NIH): 81 mg/dL (ref 0–99)
Triglycerides: 52 mg/dL (ref 0–149)
VLDL Cholesterol Cal: 10 mg/dL (ref 5–40)

## 2019-11-09 LAB — HEMOGLOBIN A1C
Est. average glucose Bld gHb Est-mCnc: 114 mg/dL
Hgb A1c MFr Bld: 5.6 % (ref 4.8–5.6)

## 2019-11-09 LAB — VITAMIN D 25 HYDROXY (VIT D DEFICIENCY, FRACTURES): Vit D, 25-Hydroxy: 33.7 ng/mL (ref 30.0–100.0)

## 2019-11-16 ENCOUNTER — Other Ambulatory Visit: Payer: Self-pay | Admitting: Family Medicine

## 2019-11-16 DIAGNOSIS — I10 Essential (primary) hypertension: Secondary | ICD-10-CM

## 2019-12-04 ENCOUNTER — Ambulatory Visit: Payer: Medicare HMO | Attending: Internal Medicine

## 2019-12-04 ENCOUNTER — Other Ambulatory Visit: Payer: Self-pay | Admitting: Internal Medicine

## 2019-12-04 DIAGNOSIS — Z23 Encounter for immunization: Secondary | ICD-10-CM

## 2019-12-04 NOTE — Progress Notes (Signed)
   Covid-19 Vaccination Clinic  Name:  LIVY ROSS    MRN: [id number] DOB: December 14, 1955  12/04/2019  Ms. Deshpande was observed post Covid-19 immunization for 15 minutes without incident. She was provided with Vaccine Information Sheet and instruction to access the V-Safe system.   Ms. Feltner was instructed to call [phone] with any severe reactions post vaccine: Marland Kitchen Difficulty breathing  . Swelling of face and throat  . A fast heartbeat  . A bad rash all over body  . Dizziness and weakness

## 2019-12-24 DIAGNOSIS — R070 Pain in throat: Secondary | ICD-10-CM | POA: Diagnosis not present

## 2019-12-24 DIAGNOSIS — K219 Gastro-esophageal reflux disease without esophagitis: Secondary | ICD-10-CM | POA: Diagnosis not present

## 2019-12-31 ENCOUNTER — Ambulatory Visit: Payer: Medicare HMO | Admitting: Dermatology

## 2019-12-31 ENCOUNTER — Other Ambulatory Visit: Payer: Self-pay

## 2019-12-31 DIAGNOSIS — Z1283 Encounter for screening for malignant neoplasm of skin: Secondary | ICD-10-CM

## 2019-12-31 DIAGNOSIS — L821 Other seborrheic keratosis: Secondary | ICD-10-CM | POA: Diagnosis not present

## 2019-12-31 DIAGNOSIS — D1800 Hemangioma unspecified site: Secondary | ICD-10-CM

## 2019-12-31 DIAGNOSIS — I8393 Asymptomatic varicose veins of bilateral lower extremities: Secondary | ICD-10-CM

## 2019-12-31 DIAGNOSIS — L814 Other melanin hyperpigmentation: Secondary | ICD-10-CM | POA: Diagnosis not present

## 2019-12-31 DIAGNOSIS — L578 Other skin changes due to chronic exposure to nonionizing radiation: Secondary | ICD-10-CM | POA: Diagnosis not present

## 2019-12-31 DIAGNOSIS — D229 Melanocytic nevi, unspecified: Secondary | ICD-10-CM | POA: Diagnosis not present

## 2019-12-31 DIAGNOSIS — L738 Other specified follicular disorders: Secondary | ICD-10-CM | POA: Diagnosis not present

## 2019-12-31 DIAGNOSIS — Z86018 Personal history of other benign neoplasm: Secondary | ICD-10-CM | POA: Diagnosis not present

## 2019-12-31 NOTE — Progress Notes (Signed)
   Follow-Up Visit   Subjective  Tracie Hall is a 64 y.o. female who presents for the following: Annual Exam (Patient with a history of multiple dysplastic nevi. There is nothing new or changing today that patient is aware of. ). Patient here for full body skin exam and skin cancer screening. The patient presents for Total-Body Skin Exam (TBSE) for skin cancer screening and mole check.  The following portions of the chart were reviewed this encounter and updated as appropriate:   Tobacco  Allergies  Meds  Problems  Med Hx  Surg Hx  Fam Hx     Review of Systems:  No other skin or systemic complaints except as noted in HPI or Assessment and Plan.  Objective  Well appearing patient in no apparent distress; mood and affect are within normal limits.  A full examination was performed including scalp, head, eyes, ears, nose, lips, neck, chest, axillae, abdomen, back, buttocks, bilateral upper extremities, bilateral lower extremities, hands, feet, fingers, toes, fingernails, and toenails. All findings within normal limits unless otherwise noted below.  Objective  Bilateral calf: Telangiectasias    Objective  above right oral commissure: 2.52mm yellow papule   Assessment & Plan  Spider veins of both lower extremities Bilateral calf  Benign-appearing.  Observation.  Call clinic for new or changing lesions.  Recommend daily use of broad spectrum spf 30+ sunscreen to sun-exposed areas.    Sebaceous hyperplasia above right oral commissure  Benign-appearing.  Observation.  Call clinic for new or changing lesions.  Recommend daily use of broad spectrum spf 30+ sunscreen to sun-exposed areas.   Stable   Skin cancer screening   Lentigines - Scattered tan macules, discussed IPL for brown spots at face - Discussed due to sun exposure - Benign, observe - Call for any changes, patient to call to schedule laser  Seborrheic Keratoses - Stuck-on, waxy, tan-brown papules and  plaques  - Discussed benign etiology and prognosis. - Observe - Call for any changes  Melanocytic Nevi - Tan-brown and/or pink-flesh-colored symmetric macules and papules - Benign appearing on exam today - Observation - Call clinic for new or changing moles - Recommend daily use of broad spectrum spf 30+ sunscreen to sun-exposed areas.   Hemangiomas - Red papules - Discussed benign nature - Observe - Call for any changes  Actinic Damage - Chronic, secondary to cumulative UV/sun exposure - diffuse scaly erythematous macules with underlying dyspigmentation - Recommend daily broad spectrum sunscreen SPF 30+ to sun-exposed areas, reapply every 2 hours as needed.  - Call for new or changing lesions.  Skin cancer screening performed today.  History of Dysplastic Nevi - No evidence of recurrence today - Recommend regular full body skin exams - Recommend daily broad spectrum sunscreen SPF 30+ to sun-exposed areas, reapply every 2 hours as needed.  - Call if any new or changing lesions are noted between office visits  Return in about 1 year (around 12/30/2020) for TBSE.  Graciella Belton, RMA, am acting as scribe for Sarina Ser, MD . Documentation: I have reviewed the above documentation for accuracy and completeness, and I agree with the above.  Sarina Ser, MD

## 2019-12-31 NOTE — Patient Instructions (Signed)

## 2020-01-04 ENCOUNTER — Encounter: Payer: Self-pay | Admitting: Dermatology

## 2020-01-23 ENCOUNTER — Other Ambulatory Visit: Payer: Self-pay | Admitting: Family Medicine

## 2020-01-23 DIAGNOSIS — I10 Essential (primary) hypertension: Secondary | ICD-10-CM

## 2020-02-05 ENCOUNTER — Ambulatory Visit: Payer: Medicare HMO | Admitting: Gastroenterology

## 2020-02-05 ENCOUNTER — Encounter: Payer: Self-pay | Admitting: Gastroenterology

## 2020-02-05 VITALS — BP 178/82 | HR 94 | Temp 98.5°F | Ht 65.0 in | Wt 133.0 lb

## 2020-02-05 DIAGNOSIS — K219 Gastro-esophageal reflux disease without esophagitis: Secondary | ICD-10-CM

## 2020-02-05 MED ORDER — OMEPRAZOLE 20 MG PO CPDR
20.0000 mg | DELAYED_RELEASE_CAPSULE | Freq: Every day | ORAL | 1 refills | Status: DC
Start: 2020-02-05 — End: 2020-05-30

## 2020-02-05 NOTE — Progress Notes (Signed)
Primary Care Physician: Virginia Crews, MD  Primary Gastroenterologist:  Dr. Lucilla Lame  Chief Complaint  Patient presents with  . Gastroesophageal Reflux    HPI: Tracie Hall is a 65 y.o. female here who has a history of of a sore throat and was seen by ENT.  The patient was started on medication for possible postnasal drip and did not get much relief.  The patient was then started on omeprazole 40 mg a day and states that her throat pain went away.  She denies any soreness at the present time but was recommended to come see me because of her symptoms.  The patient denies any unexplained weight loss fevers chills nausea vomiting black stools or bloody stools.  The patient also denies any food getting stuck on the way down.  She does state that she thinks she may not be drinking enough water after taking her calcium pills and sometimes gets a globus sensation.  She feels much better since starting the omeprazole 40 mg a day.  The patient was wondering if she can go down to a lower dose.  She was also questioning whether she needed an upper endoscopy or not  Past Medical History:  Diagnosis Date  . Anxiety   . Arthritis    back  . Hx of dysplastic nevus    multiple sites  . Hypertension    controlled on med  . Mitral valve prolapse    Diagnosed years ago, present Dr says no prolapse, Causes no issues  . PONV (postoperative nausea and vomiting)   . Spine curvature, acquired     pt. states curve at top and bottom  . Tremors of nervous system    non essential tremors    Current Outpatient Medications  Medication Sig Dispense Refill  . CALCIUM-VITAMIN D PO Take 600 mg by mouth daily.    . cetirizine (ZYRTEC) 10 MG tablet Take 1 tablet (10 mg total) by mouth daily. 30 tablet 0  . conjugated estrogens (PREMARIN) vaginal cream PLACE 8.29 APPLICATORFULS VAGINALLY 2 (TWO) TIMES A WEEK. 30 g 2  . metoprolol succinate (TOPROL-XL) 50 MG 24 hr tablet TAKE 1 TABLET BY MOUTH EVERY  DAY 90 tablet 1  . omeprazole (PRILOSEC) 40 MG capsule Take 40 mg by mouth daily.     No current facility-administered medications for this visit.    Allergies as of 02/05/2020  . (No Known Allergies)    ROS:  General: Negative for anorexia, weight loss, fever, chills, fatigue, weakness. ENT: Negative for hoarseness, difficulty swallowing , nasal congestion. CV: Negative for chest pain, angina, palpitations, dyspnea on exertion, peripheral edema.  Respiratory: Negative for dyspnea at rest, dyspnea on exertion, cough, sputum, wheezing.  GI: See history of present illness. GU:  Negative for dysuria, hematuria, urinary incontinence, urinary frequency, nocturnal urination.  Endo: Negative for unusual weight change.    Physical Examination:   There were no vitals taken for this visit.  General: Well-nourished, well-developed in no acute distress.  Eyes: No icterus. Conjunctivae pink. Lungs: Clear to auscultation bilaterally. Non-labored. Heart: Regular rate and rhythm, no murmurs rubs or gallops.  Abdomen: Bowel sounds are normal, nontender, nondistended, no hepatosplenomegaly or masses, no abdominal bruits or hernia , no rebound or guarding.   Extremities: No lower extremity edema. No clubbing or deformities. Neuro: Alert and oriented x 3.  Grossly intact. Skin: Warm and dry, no jaundice.   Psych: Alert and cooperative, normal mood and affect.  Labs:  Imaging Studies: No results found.  Assessment and Plan:   Tracie Hall is a 65 y.o. y/o female who comes in today after being started on a PPI for a sore throat.  The patient has done very well with the PPI and reports that she is not having any more throat pain although she has some intermittent globus sensation especially after taking calcium tablets.  The patient has been told to go down on the omeprazole to 20 mg a day half hour before she eats and to see if this keeps her symptoms under control.  She will contact me if  her symptoms return and at that time we will consider whether to proceeding with a upper endoscopy or going back on the 40 mg of omeprazole.  The patient has been explained the plan and agrees with it.     Lucilla Lame, MD. [facility] Regal    Note: This dictation was prepared with Dragon dictation along with smaller phrase technology. Any transcriptional errors that result from this process are unintentional.

## 2020-03-06 ENCOUNTER — Ambulatory Visit: Payer: Medicare HMO | Admitting: Dermatology

## 2020-03-18 ENCOUNTER — Other Ambulatory Visit: Payer: Self-pay

## 2020-03-18 ENCOUNTER — Ambulatory Visit: Payer: Medicare HMO | Admitting: Gastroenterology

## 2020-03-18 VITALS — BP 162/84 | HR 80 | Ht 65.0 in | Wt 132.6 lb

## 2020-03-18 DIAGNOSIS — K219 Gastro-esophageal reflux disease without esophagitis: Secondary | ICD-10-CM | POA: Diagnosis not present

## 2020-03-18 DIAGNOSIS — R198 Other specified symptoms and signs involving the digestive system and abdomen: Secondary | ICD-10-CM | POA: Diagnosis not present

## 2020-03-18 DIAGNOSIS — R0989 Other specified symptoms and signs involving the circulatory and respiratory systems: Secondary | ICD-10-CM

## 2020-03-18 NOTE — Patient Instructions (Signed)
[email or web address]">  Heartburn Heartburn is a type of pain or discomfort that can happen in the throat or chest. It is often described as a burning pain. It may also cause a bad, acid-like taste in the mouth. Heartburn may feel worse when you lie down or bend over, and it is often worse at night. Heartburn may be caused by stomach contents that move back up into the esophagus (reflux). Follow these instructions at home: Eating and drinking  Avoid certain foods and drinks as told by your health care provider. This may include: ? Coffee and tea, with or without caffeine. ? Drinks that contain alcohol. ? Energy drinks and sports drinks. ? Carbonated drinks or sodas. ? Chocolate and cocoa. ? Peppermint and mint flavorings. ? Garlic and onions. ? Horseradish. ? Spicy and acidic foods, including peppers, chili powder, curry powder, vinegar, hot sauces, and barbecue sauce. ? Citrus fruit juices and citrus fruits, such as oranges, lemons, and limes. ? Tomato-based foods, such as red sauce, chili, salsa, and pizza with red sauce. ? Fried and fatty foods, such as donuts, french fries, potato chips, and high-fat dressings. ? High-fat meats, such as hot dogs and fatty cuts of red and white meats, such as rib eye steak, sausage, ham, and bacon. ? High-fat dairy items, such as whole milk, butter, and cream cheese.  Eat small, frequent meals instead of large meals.  Avoid drinking large amounts of liquid with your meals.  Avoid eating meals during the 2-3 hours before bedtime.  Avoid lying down right after you eat.  Do not exercise right after you eat.   Lifestyle  If you are overweight, reduce your weight to an amount that is healthy for you. Ask your health care provider for guidance about a safe weight loss goal.  Do not use any products that contain nicotine or tobacco. These products include cigarettes, chewing tobacco, and vaping devices, such as  e-cigarettes. These can make symptoms worse. If you need help quitting, ask your health care provider.  Wear loose-fitting clothing. Do not wear anything tight around your waist that causes pressure on your abdomen.  Raise (elevate) the head of your bed about 6 inches (15 cm) when you sleep. You can use a wedge to do this.  Try to reduce your stress, such as with yoga or meditation. If you need help reducing stress, ask your health care provider.      Medicines  Take over-the-counter and prescription medicines only as told by your health care provider.  Do not take aspirin or NSAIDs, such as ibuprofen, unless your health care provider told you to do so.  Stop medicines only as told by your health care provider. If you stop taking some medicines too quickly, your symptoms may get worse. General instructions  Pay attention to any changes in your symptoms.  Keep all follow-up visits. This is important. Contact a health care provider if:  You have new symptoms.  You have unexplained weight loss.  You have difficulty swallowing, or it hurts to swallow.  You have wheezing or a persistent cough.  Your symptoms do not improve with treatment.  You have frequent heartburn for more than 2 weeks. Get help right away if:  You suddenly have pain in your arms, neck, jaw, teeth, or back.  You suddenly feel sweaty, dizzy, or light-headed.  You have chest pain or shortness of breath.  You vomit and your vomit looks like blood or coffee grounds.  Your stool is bloody or black.  These symptoms may represent a serious problem that is an emergency. Do not wait to see if the symptoms will go away. Get medical help right away. Call your local emergency services (911 in the [place] Do not drive yourself to the hospital. Summary  Heartburn is a type of pain or discomfort that can happen in the throat or chest. It is often described as a burning pain. It may also cause a bad, acid-like taste in  the mouth.  Avoid certain foods and drinks as told by your health care provider.  Take over-the-counter and prescription medicines only as told by your health care provider. Do not take aspirin or NSAIDs, such as ibuprofen, unless your health care provider told you to do so.  Contact a health care provider if your symptoms do not improve or they get worse. This information is not intended to replace advice given to you by your health care provider. Make sure you discuss any questions you have with your health care provider. Document Revised: 07/18/2019 Document Reviewed: 07/18/2019 Elsevier Patient Education  [place].

## 2020-03-18 NOTE — H&P (View-Only) (Signed)
Primary Care Physician: Virginia Crews, MD  Primary Gastroenterologist:  Dr. Lucilla Lame  Chief Complaint  Patient presents with  . Follow up GERD    HPI: Tracie Hall is a 65 y.o. female here for follow-up of her globus sensation. The patient comes in with multiple questions about her fullness in her throat.  The patient denies any unexplained weight loss fevers chills nausea or vomiting. The patient wanted discuss the possible side effects of PPIs.  She also states that her symptoms got worse when she went down to the omeprazole 20 mg from the 40 mg and has been back on the 40 mg for a couple weeks.  Past Medical History:  Diagnosis Date  . Anxiety   . Arthritis    back  . Hx of dysplastic nevus    multiple sites  . Hypertension    controlled on med  . Mitral valve prolapse    Diagnosed years ago, present Dr says no prolapse, Causes no issues  . PONV (postoperative nausea and vomiting)   . Spine curvature, acquired     pt. states curve at top and bottom  . Tremors of nervous system    non essential tremors    Current Outpatient Medications  Medication Sig Dispense Refill  . CALCIUM-VITAMIN D PO Take 600 mg by mouth daily.    Marland Kitchen conjugated estrogens (PREMARIN) vaginal cream PLACE 0.73 APPLICATORFULS VAGINALLY 2 (TWO) TIMES A WEEK. 30 g 2  . metoprolol succinate (TOPROL-XL) 50 MG 24 hr tablet TAKE 1 TABLET BY MOUTH EVERY DAY 90 tablet 1  . omeprazole (PRILOSEC) 20 MG capsule Take 1 capsule (20 mg total) by mouth daily. (Patient taking differently: Take 40 mg by mouth daily.) 90 capsule 1  . cetirizine (ZYRTEC) 10 MG tablet Take 1 tablet (10 mg total) by mouth daily. (Patient not taking: Reported on 03/18/2020) 30 tablet 0   No current facility-administered medications for this visit.    Allergies as of 03/18/2020  . (No Known Allergies)    ROS:  General: Negative for anorexia, weight loss, fever, chills, fatigue, weakness. ENT: Negative for hoarseness,  difficulty swallowing , nasal congestion. CV: Negative for chest pain, angina, palpitations, dyspnea on exertion, peripheral edema.  Respiratory: Negative for dyspnea at rest, dyspnea on exertion, cough, sputum, wheezing.  GI: See history of present illness. GU:  Negative for dysuria, hematuria, urinary incontinence, urinary frequency, nocturnal urination.  Endo: Negative for unusual weight change.    Physical Examination:   BP (!) 162/84   Pulse 80   Ht 5\' 5"  (1.651 m)   Wt 132 lb 9.6 oz (60.1 kg)   BMI 22.07 kg/m   General: Well-nourished, well-developed in no acute distress.  Eyes: No icterus. Conjunctivae pink. Neuro: Alert and oriented x 3.  Grossly intact. Skin: Warm and dry, no jaundice.   Psych: Alert and cooperative, normal mood and affect.  Labs:    Imaging Studies: No results found.  Assessment and Plan:   Tracie Hall is a 65 y.o. y/o female who comes in today with a 2 questions as a follow-up to our most recent office visit. The patient has had her questions answered and the patient has also reports some acid breakthrough a few times a week.  The patient would like to be set up for an upper endoscopy due to her long-standing history of heartburn.  The patient will be set up for an EGD and if there is any sign of reflux she  will be considered for switching her PPI to Protonix 40 mg a day.  The patient has been explained the plan and will follow up at the time of the EGD.     Lucilla Lame, MD. [facility]al    Note: This dictation was prepared with Dragon dictation along with smaller phrase technology. Any transcriptional errors that result from this process are unintentional.

## 2020-03-18 NOTE — Progress Notes (Signed)
Primary Care Physician: Virginia Crews, MD  Primary Gastroenterologist:  Dr. Lucilla Lame  Chief Complaint  Patient presents with  . Follow up GERD    HPI: Tracie Hall is a 65 y.o. female here for follow-up of her globus sensation. The patient comes in with multiple questions about her fullness in her throat.  The patient denies any unexplained weight loss fevers chills nausea or vomiting. The patient wanted discuss the possible side effects of PPIs.  She also states that her symptoms got worse when she went down to the omeprazole 20 mg from the 40 mg and has been back on the 40 mg for a couple weeks.  Past Medical History:  Diagnosis Date  . Anxiety   . Arthritis    back  . Hx of dysplastic nevus    multiple sites  . Hypertension    controlled on med  . Mitral valve prolapse    Diagnosed years ago, present Dr says no prolapse, Causes no issues  . PONV (postoperative nausea and vomiting)   . Spine curvature, acquired     pt. states curve at top and bottom  . Tremors of nervous system    non essential tremors    Current Outpatient Medications  Medication Sig Dispense Refill  . CALCIUM-VITAMIN D PO Take 600 mg by mouth daily.    Marland Kitchen conjugated estrogens (PREMARIN) vaginal cream PLACE 7.35 APPLICATORFULS VAGINALLY 2 (TWO) TIMES A WEEK. 30 g 2  . metoprolol succinate (TOPROL-XL) 50 MG 24 hr tablet TAKE 1 TABLET BY MOUTH EVERY DAY 90 tablet 1  . omeprazole (PRILOSEC) 20 MG capsule Take 1 capsule (20 mg total) by mouth daily. (Patient taking differently: Take 40 mg by mouth daily.) 90 capsule 1  . cetirizine (ZYRTEC) 10 MG tablet Take 1 tablet (10 mg total) by mouth daily. (Patient not taking: Reported on 03/18/2020) 30 tablet 0   No current facility-administered medications for this visit.    Allergies as of 03/18/2020  . (No Known Allergies)    ROS:  General: Negative for anorexia, weight loss, fever, chills, fatigue, weakness. ENT: Negative for hoarseness,  difficulty swallowing , nasal congestion. CV: Negative for chest pain, angina, palpitations, dyspnea on exertion, peripheral edema.  Respiratory: Negative for dyspnea at rest, dyspnea on exertion, cough, sputum, wheezing.  GI: See history of present illness. GU:  Negative for dysuria, hematuria, urinary incontinence, urinary frequency, nocturnal urination.  Endo: Negative for unusual weight change.    Physical Examination:   BP (!) 162/84   Pulse 80   Ht 5\' 5"  (1.651 m)   Wt 132 lb 9.6 oz (60.1 kg)   BMI 22.07 kg/m   General: Well-nourished, well-developed in no acute distress.  Eyes: No icterus. Conjunctivae pink. Neuro: Alert and oriented x 3.  Grossly intact. Skin: Warm and dry, no jaundice.   Psych: Alert and cooperative, normal mood and affect.  Labs:    Imaging Studies: No results found.  Assessment and Plan:   [name] is a 65 y.o. y/o female who comes in today with a 2 questions as a follow-up to our most recent office visit. The patient has had her questions answered and the patient has also reports some acid breakthrough a few times a week.  The patient would like to be set up for an upper endoscopy due to her long-standing history of heartburn.  The patient will be set up for an EGD and if there is any sign of reflux she  will be considered for switching her PPI to Protonix 40 mg a day.  The patient has been explained the plan and will follow up at the time of the EGD.     Lucilla Lame, MD. [facility] Regal    Note: This dictation was prepared with Dragon dictation along with smaller phrase technology. Any transcriptional errors that result from this process are unintentional.

## 2020-03-19 ENCOUNTER — Other Ambulatory Visit: Payer: Self-pay

## 2020-03-19 DIAGNOSIS — K219 Gastro-esophageal reflux disease without esophagitis: Secondary | ICD-10-CM

## 2020-03-24 ENCOUNTER — Encounter: Payer: Self-pay | Admitting: Gastroenterology

## 2020-03-24 ENCOUNTER — Other Ambulatory Visit: Payer: Self-pay

## 2020-03-26 ENCOUNTER — Other Ambulatory Visit: Payer: Self-pay

## 2020-03-26 ENCOUNTER — Other Ambulatory Visit
Admission: RE | Admit: 2020-03-26 | Discharge: 2020-03-26 | Disposition: A | Discharge status: Home / Self Care | Payer: Medicare HMO | Source: Ambulatory Visit | Attending: Gastroenterology | Admitting: Gastroenterology

## 2020-03-26 DIAGNOSIS — Z20822 Contact with and (suspected) exposure to covid-19: Secondary | ICD-10-CM | POA: Diagnosis not present

## 2020-03-26 DIAGNOSIS — Z01812 Encounter for preprocedural laboratory examination: Secondary | ICD-10-CM | POA: Diagnosis not present

## 2020-03-26 LAB — SARS CORONAVIRUS 2 (TAT 6-24 HRS): SARS Coronavirus 2: NEGATIVE

## 2020-03-27 NOTE — Discharge Instructions (Signed)

## 2020-03-28 ENCOUNTER — Encounter
Admission: RE | Disposition: A | Discharge status: Home / Self Care | Payer: Self-pay | Source: Home / Self Care | Attending: Gastroenterology

## 2020-03-28 ENCOUNTER — Encounter: Payer: Self-pay | Admitting: Gastroenterology

## 2020-03-28 ENCOUNTER — Ambulatory Visit
Admission: RE | Admit: 2020-03-28 | Discharge: 2020-03-28 | Disposition: A | Discharge status: Home / Self Care | Payer: Medicare HMO | Attending: Gastroenterology | Admitting: Gastroenterology

## 2020-03-28 ENCOUNTER — Ambulatory Visit: Payer: Medicare HMO | Admitting: Anesthesiology

## 2020-03-28 ENCOUNTER — Other Ambulatory Visit: Payer: Self-pay

## 2020-03-28 DIAGNOSIS — K317 Polyp of stomach and duodenum: Secondary | ICD-10-CM | POA: Insufficient documentation

## 2020-03-28 DIAGNOSIS — Z79899 Other long term (current) drug therapy: Secondary | ICD-10-CM | POA: Diagnosis not present

## 2020-03-28 DIAGNOSIS — R12 Heartburn: Secondary | ICD-10-CM | POA: Diagnosis not present

## 2020-03-28 DIAGNOSIS — Z8249 Family history of ischemic heart disease and other diseases of the circulatory system: Secondary | ICD-10-CM | POA: Diagnosis not present

## 2020-03-28 DIAGNOSIS — K219 Gastro-esophageal reflux disease without esophagitis: Secondary | ICD-10-CM | POA: Diagnosis not present

## 2020-03-28 DIAGNOSIS — Z8349 Family history of other endocrine, nutritional and metabolic diseases: Secondary | ICD-10-CM | POA: Insufficient documentation

## 2020-03-28 DIAGNOSIS — K2970 Gastritis, unspecified, without bleeding: Secondary | ICD-10-CM | POA: Insufficient documentation

## 2020-03-28 HISTORY — PX: ESOPHAGOGASTRODUODENOSCOPY (EGD) WITH PROPOFOL: SHX5813

## 2020-03-28 SURGERY — ESOPHAGOGASTRODUODENOSCOPY (EGD) WITH PROPOFOL
Anesthesia: General | Site: Mouth

## 2020-03-28 MED ORDER — STERILE WATER FOR IRRIGATION IR SOLN: Status: DC | PRN

## 2020-03-28 MED ORDER — GLYCOPYRROLATE 0.2 MG/ML IJ SOLN
INTRAMUSCULAR | Status: DC | PRN
  Administered 2020-03-28: .1 mg via INTRAVENOUS

## 2020-03-28 MED ORDER — LIDOCAINE HCL (CARDIAC) PF 100 MG/5ML IV SOSY
PREFILLED_SYRINGE | INTRAVENOUS | Status: DC | PRN
  Administered 2020-03-28: 50 mg via INTRAVENOUS

## 2020-03-28 MED ORDER — PROPOFOL 10 MG/ML IV BOLUS
INTRAVENOUS | Status: DC | PRN
Start: 2020-03-28 — End: 2020-03-28
  Administered 2020-03-28: 40 mg via INTRAVENOUS
  Administered 2020-03-28: 30 mg via INTRAVENOUS
  Administered 2020-03-28: 150 mg via INTRAVENOUS
  Administered 2020-03-28: 40 mg via INTRAVENOUS
  Administered 2020-03-28 (×2): 30 mg via INTRAVENOUS

## 2020-03-28 MED ORDER — SODIUM CHLORIDE 0.9 % IV SOLN: INTRAVENOUS | Status: DC

## 2020-03-28 MED ORDER — LACTATED RINGERS IV SOLN: INTRAVENOUS | Status: DC

## 2020-03-28 SURGICAL SUPPLY — 11 items
BLOCK BITE 60FR ADLT L/F GRN (MISCELLANEOUS) ×2 IMPLANT
FCP ESCP3.2XJMB 240X2.8X (MISCELLANEOUS) ×1
FORCEPS BIOP RAD 4 LRG CAP 4 (CUTTING FORCEPS) ×2 IMPLANT
FORCEPS BIOP RJ4 240 W/NDL (MISCELLANEOUS) ×1
FORCEPS ESCP3.2XJMB 240X2.8X (MISCELLANEOUS) ×1 IMPLANT
GOWN CVR UNV OPN BCK APRN NK (MISCELLANEOUS) ×2 IMPLANT
GOWN ISOL THUMB LOOP REG UNIV (MISCELLANEOUS) ×2
KIT PRC NS LF DISP ENDO (KITS) ×1 IMPLANT
KIT PROCEDURE OLYMPUS (KITS) ×1
MANIFOLD NEPTUNE II (INSTRUMENTS) ×2 IMPLANT
WATER STERILE IRR 250ML POUR (IV SOLUTION) ×2 IMPLANT

## 2020-03-28 NOTE — Anesthesia Procedure Notes (Signed)
Date/Time: 03/28/2020 10:50 AM Performed by: Cameron Ali, CRNA Pre-anesthesia Checklist: Patient identified, Emergency Drugs available, Suction available, Timeout performed and Patient being monitored Patient Re-evaluated:Patient Re-evaluated prior to induction Oxygen Delivery Method: Nasal cannula Placement Confirmation: positive ETCO2

## 2020-03-28 NOTE — Anesthesia Preprocedure Evaluation (Signed)
Anesthesia Evaluation  Patient identified by MRN, date of birth, ID band Patient awake    Reviewed: Allergy & Precautions, NPO status , Patient's Chart, lab work & pertinent test results  History of Anesthesia Complications (+) PONV  Airway Mallampati: II  TM Distance: >3 FB     Dental   Pulmonary    breath sounds clear to auscultation       Cardiovascular hypertension,  Rhythm:Regular Rate:Normal     Neuro/Psych Anxiety    GI/Hepatic   Endo/Other    Renal/GU      Musculoskeletal  (+) Arthritis ,   Abdominal   Peds  Hematology   Anesthesia Other Findings   Reproductive/Obstetrics                             Anesthesia Physical Anesthesia Plan  ASA: II  Anesthesia Plan: General   Post-op Pain Management:    Induction: Intravenous  PONV Risk Score and Plan: Propofol infusion, TIVA and Treatment may vary due to age or medical condition  Airway Management Planned: Natural Airway and Nasal Cannula  Additional Equipment:   Intra-op Plan:   Post-operative Plan:   Informed Consent: I have reviewed the patients History and Physical, chart, labs and discussed the procedure including the risks, benefits and alternatives for the proposed anesthesia with the patient or authorized representative who has indicated his/her understanding and acceptance.       Plan Discussed with: CRNA  Anesthesia Plan Comments:         Anesthesia Quick Evaluation

## 2020-03-28 NOTE — Transfer of Care (Signed)
Immediate Anesthesia Transfer of Care Note  Patient: Tracie Hall  Procedure(s) Performed: ESOPHAGOGASTRODUODENOSCOPY (EGD) WITH BIOPSY (N/A Mouth)  Patient Location: PACU  Anesthesia Type: General  Level of Consciousness: awake, alert  and patient cooperative  Airway and Oxygen Therapy: Patient Spontanous Breathing and Patient connected to supplemental oxygen  Post-op Assessment: Post-op Vital signs reviewed, Patient's Cardiovascular Status Stable, Respiratory Function Stable, Patent Airway and No signs of Nausea or vomiting  Post-op Vital Signs: Reviewed and stable  Complications: No complications documented.

## 2020-03-28 NOTE — Op Note (Signed)
[facility] Gastroenterology Patient Name: Tracie Hall Procedure Date: 03/28/2020 10:29 AM MRN: [id number] Account #: [id number] Date of Birth: January 28, 1955 Admit Type: Outpatient Age: 65 Room: [facility] OR ROOM 01 Gender: Female Note Status: Finalized Procedure:             Upper GI endoscopy Indications:           Heartburn Providers:             Lucilla Lame MD, MD Referring MD:          Dionne Bucy. Bacigalupo (Referring MD) Medicines:             Propofol per Anesthesia Complications:         No immediate complications. Procedure:             Pre-Anesthesia Assessment:                        - Prior to the procedure, a History and Physical was                         performed, and patient medications and allergies were                         reviewed. The patient's tolerance of previous                         anesthesia was also reviewed. The risks and benefits                         of the procedure and the sedation options and risks                         were discussed with the patient. All questions were                         answered, and informed consent was obtained. Prior                         Anticoagulants: The patient has taken no previous                         anticoagulant or antiplatelet agents. ASA Grade                         Assessment: II - A patient with mild systemic disease.                         After reviewing the risks and benefits, the patient                         was deemed in satisfactory condition to undergo the                         procedure.                        After obtaining informed consent, the endoscope was  passed under direct vision. Throughout the procedure,                         the patient's blood pressure, pulse, and oxygen                         saturations were monitored continuously. The                         Endosonoscope was introduced through the mouth, and                          advanced to the second part of duodenum. The upper GI                         endoscopy was accomplished without difficulty. The                         patient tolerated the procedure well. Findings:      The examined esophagus was normal.      Multiple medium sessile polyps with no bleeding and no stigmata of       recent bleeding were found in the stomach. Biopsies were taken with a       cold forceps for histology.      The examined duodenum was normal. Impression:            - Normal esophagus.                        - Multiple gastric polyps. Biopsied.                        - Normal examined duodenum. Recommendation:        - Await pathology results.                        - Discharge patient to home.                        - Resume previous diet.                        - Continue present medications.                        - Await pathology results.                        - If the polyps are adenomas then will need removal. Procedure Code(s):     --- Professional ---                        [id number], Esophagogastroduodenoscopy, flexible,                         transoral; with biopsy, single or multiple Diagnosis Code(s):     --- Professional ---                        R12, Heartburn  K31.7, Polyp of stomach and duodenum CPT copyright 2019 American Medical Association. All rights reserved. The codes documented in this report are preliminary and upon coder review may  be revised to meet current compliance requirements. Lucilla Lame MD, MD 03/28/2020 11:07:02 AM This report has been signed electronically. Number of Addenda: 0 Note Initiated On: 03/28/2020 10:29 AM Estimated Blood Loss:  Estimated blood loss: none.      [facility]

## 2020-03-28 NOTE — Anesthesia Postprocedure Evaluation (Signed)
Anesthesia Post Note  Patient: Tracie Hall  Procedure(s) Performed: ESOPHAGOGASTRODUODENOSCOPY (EGD) WITH BIOPSY (N/A Mouth)     Patient location during evaluation: PACU Anesthesia Type: General Level of consciousness: awake Pain management: pain level controlled Vital Signs Assessment: post-procedure vital signs reviewed and stable Respiratory status: respiratory function stable Cardiovascular status: stable Postop Assessment: no signs of nausea or vomiting Anesthetic complications: no   No complications documented.  Veda Canning
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
## 2020-03-31 ENCOUNTER — Encounter: Payer: Self-pay | Admitting: Gastroenterology

## 2020-04-01 LAB — SURGICAL PATHOLOGY

## 2020-04-02 ENCOUNTER — Encounter: Payer: Self-pay | Admitting: Gastroenterology

## 2020-04-22 ENCOUNTER — Other Ambulatory Visit: Payer: Self-pay

## 2020-04-22 ENCOUNTER — Encounter: Payer: Self-pay | Admitting: Family Medicine

## 2020-04-22 ENCOUNTER — Ambulatory Visit (INDEPENDENT_AMBULATORY_CARE_PROVIDER_SITE_OTHER): Payer: Medicare HMO | Admitting: Family Medicine

## 2020-04-22 VITALS — BP 139/67 | HR 86 | Temp 97.9°F | Wt 133.6 lb

## 2020-04-22 DIAGNOSIS — K644 Residual hemorrhoidal skin tags: Secondary | ICD-10-CM

## 2020-04-22 DIAGNOSIS — L293 Anogenital pruritus, unspecified: Secondary | ICD-10-CM | POA: Diagnosis not present

## 2020-04-22 DIAGNOSIS — K219 Gastro-esophageal reflux disease without esophagitis: Secondary | ICD-10-CM | POA: Insufficient documentation

## 2020-04-22 NOTE — Patient Instructions (Signed)

## 2020-04-22 NOTE — Progress Notes (Signed)
Established patient visit   Patient: Tracie Hall   DOB: 01/28/1955   65 y.o. Female  MRN: [id number] Visit Date: 04/22/2020  Today's healthcare provider: Lavon Paganini, MD   Chief Complaint  Patient presents with  . Hemorrhoids  I,Porsha C McClurkin,acting as a scribe for Lavon Paganini, MD.,have documented all relevant documentation on the behalf of Lavon Paganini, MD,as directed by  Lavon Paganini, MD while in the presence of Lavon Paganini, MD.  Subjective    HPI  Hemorrhoids Patient presents today for hemorrhoids for about a few weeks. She reports noticing blood on tissue after wiping. She reports having hemorrhoids in the past.  Noticed it since walking 4-5 miles daily. No constipation or straining   Medications: Outpatient Medications Prior to Visit  Medication Sig  . CALCIUM-VITAMIN D PO Take 600 mg by mouth daily.  . cetirizine (ZYRTEC) 10 MG tablet Take 1 tablet (10 mg total) by mouth daily.  Marland Kitchen conjugated estrogens (PREMARIN) vaginal cream PLACE 8.41 APPLICATORFULS VAGINALLY 2 (TWO) TIMES A WEEK.  . metoprolol succinate (TOPROL-XL) 50 MG 24 hr tablet TAKE 1 TABLET BY MOUTH EVERY DAY  . omeprazole (PRILOSEC) 20 MG capsule Take 1 capsule (20 mg total) by mouth daily. (Patient taking differently: Take 40 mg by mouth daily.)   No facility-administered medications prior to visit.    Review of Systems  Constitutional: Negative.   Respiratory: Negative.   Gastrointestinal: Positive for anal bleeding. Negative for abdominal distention, abdominal pain, blood in stool and rectal pain.  Genitourinary: Negative.   Musculoskeletal: Negative.        Objective    BP 139/67 (BP Location: Left Arm, Patient Position: Sitting, Cuff Size: Normal)   Pulse 86   Temp 97.9 F (36.6 C) (Oral)   Wt 133 lb 9.6 oz (60.6 kg)   SpO2 99%   BMI 22.23 kg/m     Physical Exam Vitals reviewed.  Constitutional:      General: She is not in acute distress.     Appearance: Normal appearance. She is well-developed.  HENT:     Head: Normocephalic and atraumatic.  Eyes:     General: No scleral icterus.    Conjunctiva/sclera: Conjunctivae normal.  Cardiovascular:     Rate and Rhythm: Normal rate and regular rhythm.  Pulmonary:     Effort: Pulmonary effort is normal. No respiratory distress.  Genitourinary:    Comments: Irritation of perineum Small external hemorrhoids without bleeding Skin:    General: Skin is warm and dry.     Findings: No rash.  Neurological:     Mental Status: She is alert and oriented to person, place, and time.  Psychiatric:        Behavior: Behavior normal.       No results found for any visits on 04/22/20.  Assessment & Plan     1. External hemorrhoids - mild, no bleeding or irritation - can use hydrocortisone cream prn  2. Perineal irritation - likely from long walks - advised to use barrier cream before walks - return precautions discussed  3. Gastroesophageal reflux disease without esophagitis - ok to try Omeprazole 20mg  daily down from 40 - advised to give it at least 4 weeks before figuring out if this will work -  She has f/u with GI coming up   Return if symptoms worsen or fail to improve.      I, Lavon Paganini, MD, have reviewed all documentation for this visit. The documentation on 04/22/20  for the exam, diagnosis, procedures, and orders are all accurate and complete.   Jshaun Abernathy, Dionne Bucy, MD, MPH [facility]

## 2020-05-27 ENCOUNTER — Ambulatory Visit: Payer: Self-pay | Admitting: *Deleted

## 2020-05-27 ENCOUNTER — Ambulatory Visit
Admission: RE | Admit: 2020-05-27 | Discharge: 2020-05-27 | Disposition: A | Discharge status: Home / Self Care | Payer: Medicare HMO | Source: Ambulatory Visit | Attending: Emergency Medicine | Admitting: Emergency Medicine

## 2020-05-27 ENCOUNTER — Other Ambulatory Visit: Payer: Self-pay

## 2020-05-27 VITALS — BP 154/89 | HR 98 | Temp 98.7°F | Resp 15

## 2020-05-27 DIAGNOSIS — L03314 Cellulitis of groin: Secondary | ICD-10-CM

## 2020-05-27 DIAGNOSIS — I10 Essential (primary) hypertension: Secondary | ICD-10-CM

## 2020-05-27 MED ORDER — MUPIROCIN 2 % EX OINT: 1.0000 "application " | TOPICAL_OINTMENT | Freq: Two times a day (BID) | CUTANEOUS | 0 refills | Status: DC

## 2020-05-27 NOTE — ED Triage Notes (Addendum)
Patient c/o possible insect bite x 1 day.   Patient states " it almost looks like a little wound and this morning some pus came out of it".   Patient endorses the area is "sensitive to the touch."  Patient endorses possible bite is located within " the pelvic area".   Patient denies itchiness.   Patient tried using an antibiotic ointment with no relief of symptoms.

## 2020-05-27 NOTE — Telephone Encounter (Signed)
Pt called in c/o a dime sized area under her belly button in the hairline that is hard, swollen, red and draining a white to light yellow pus.   Noticed it yesterday.  She is using an antibiotic OTC ointment.  There are no appts available at [facility] within the protocol timeframe of 24 hours.   Routing my notes to the office for scheduling.     Pt was agreeable to having someone call her back in regards to an appt.    Reason for Disposition . [1] Looks infected (spreading redness, pus) AND [2] no fever  Answer Assessment - Initial Assessment Questions 1. APPEARANCE of RASH: "Describe the rash."      I have a hard red area in my groin.   This morning it's red with pus coming out.   It happened suddenly.   I noticed it yesterday.   2. LOCATION: "Where is the rash located?"      Below belly button in my private in the hairline. 3. NUMBER: "How many spots are there?"      One spot that has become red and pus coming out. 4. SIZE: "How big are the spots?" (Inches, centimeters or compare to size of a coin)      One  Area size of a dime. 5. ONSET: "When did the rash start?"      Yesterday 6. ITCHING: "Does the rash itch?" If Yes, ask: "How bad is the itch?"  (Scale 1-10; or mild, moderate, severe)     No 7. PAIN: "Does the rash hurt?" If Yes, ask: "How bad is the pain?"  (Scale 1-10; or mild, moderate, severe)     No 8. OTHER SYMPTOMS: "Do you have any other symptoms?" (e.g., fever)     Pus is white a little yellow 9. PREGNANCY: "Is there any chance you are pregnant?" "When was your last menstrual period?"     N/A due to age  Protocols used: RASH OR REDNESS - LOCALIZED-A-AH

## 2020-05-27 NOTE — Telephone Encounter (Signed)
Called and spoke with patient and advised her to go to urgent care for evaluation or to seek treatment through virtual link at [email or web address]

## 2020-05-27 NOTE — Telephone Encounter (Signed)
Can offer [facility] appt or UC if we have no available appts. Please explain that we are hiring and will get back to having better access as able.
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
## 2020-05-27 NOTE — Discharge Instructions (Signed)
Keep your wound clean and dry.  Wash it gently twice a day with soap and water.  Apply the antibiotic cream and a bandage twice a day.  Return here if you have increased redness, rash, fever, or other concerning symptoms.   Your blood pressure is elevated today at 154/89.  Please have this rechecked by your primary care provider in 2-4 weeks.

## 2020-05-30 ENCOUNTER — Ambulatory Visit: Payer: Medicare HMO | Admitting: Cardiovascular Disease

## 2020-05-30 ENCOUNTER — Other Ambulatory Visit: Payer: Self-pay

## 2020-05-30 ENCOUNTER — Encounter: Payer: Self-pay | Admitting: Cardiovascular Disease

## 2020-05-30 VITALS — BP 142/75 | HR 79 | Ht 65.0 in | Wt 135.5 lb

## 2020-05-30 DIAGNOSIS — K219 Gastro-esophageal reflux disease without esophagitis: Secondary | ICD-10-CM | POA: Diagnosis not present

## 2020-05-30 DIAGNOSIS — I10 Essential (primary) hypertension: Secondary | ICD-10-CM | POA: Diagnosis not present

## 2020-05-30 DIAGNOSIS — R7303 Prediabetes: Secondary | ICD-10-CM | POA: Diagnosis not present

## 2020-05-30 DIAGNOSIS — R002 Palpitations: Secondary | ICD-10-CM | POA: Diagnosis not present

## 2020-05-30 NOTE — Patient Instructions (Signed)
Medication Instructions:  No changes  If you need a refill on your cardiac medications before your next appointment, please call your pharmacy.    Lab work: No new labs needed   If you have labs (blood work) drawn today and your tests are completely normal, you will receive your results only by: . MyChart Message (if you have MyChart) OR . A paper copy in the mail If you have any lab test that is abnormal or we need to change your treatment, we will call you to review the results.   Testing/Procedures: No new testing needed   Follow-Up: At [facility], you and your health needs are our priority.  As part of our continuing mission to provide you with exceptional heart care, we have created designated Provider Care Teams.  These Care Teams include your primary Cardiologist (physician) and Advanced Practice Providers (APPs -  Physician Assistants and Nurse Practitioners) who all work together to provide you with the care you need, when you need it.  . You will need a follow up appointment as needed  . Providers on your designated Care Team:   . Christopher Berge, NP . Ryan Dunn, PA-C . Jacquelyn Visser, PA-C  Any Other Special Instructions Will Be Listed Below (If Applicable).  COVID-19 Vaccine Information can be found at: [email or web address] For questions related to vaccine distribution or appointments, please email [email or web address] or call [phone].     
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
## 2020-06-17 ENCOUNTER — Encounter: Payer: Self-pay | Admitting: Family Medicine

## 2020-06-17 ENCOUNTER — Ambulatory Visit: Payer: Medicare HMO | Admitting: Gastroenterology

## 2020-06-17 ENCOUNTER — Other Ambulatory Visit: Payer: Self-pay

## 2020-06-17 VITALS — BP 166/80 | HR 81 | Temp 97.6°F | Ht 65.0 in | Wt 137.5 lb

## 2020-06-17 DIAGNOSIS — K219 Gastro-esophageal reflux disease without esophagitis: Secondary | ICD-10-CM

## 2020-06-17 MED ORDER — TRIAMCINOLONE ACETONIDE 0.5 % EX OINT: 1.0000 "application " | TOPICAL_OINTMENT | Freq: Two times a day (BID) | CUTANEOUS | 0 refills | Status: DC

## 2020-06-17 NOTE — Progress Notes (Signed)
Primary Care Physician: Virginia Crews, MD  Primary Gastroenterologist:  Dr. Lucilla Lame  Chief Complaint  Patient presents with  . Gastroesophageal Reflux    HPI: Tracie Hall is a 65 y.o. female here for follow-up of her GERD. The patient had questions about her gastric polyps that were found to be fundal gland polyps.  The patient states that she went down to 20 mg of omeprazole from 40 mg and states that she felt better and then completely stopped her medication and she has been doing well.  She has done some diet modification.  There is no report of any unexplained weight loss fevers chills nausea or vomiting.  The patient was recommended to have a repeat colonoscopy in 5 years from her last colonoscopy.  Past Medical History:  Diagnosis Date  . Anxiety   . Arthritis    back  . Hx of dysplastic nevus    multiple sites  . Hypertension    controlled on med  . Mitral valve prolapse    Diagnosed years ago, present Dr says no prolapse, Causes no issues  . PONV (postoperative nausea and vomiting)   . Spine curvature, acquired     pt. states curve at top and bottom  . Tremors of nervous system    non essential tremors    Current Outpatient Medications  Medication Sig Dispense Refill  . CALCIUM-VITAMIN D PO Take 600 mg by mouth daily.    . cetirizine (ZYRTEC) 10 MG tablet Take 1 tablet (10 mg total) by mouth daily. 30 tablet 0  . conjugated estrogens (PREMARIN) vaginal cream PLACE 1.30 APPLICATORFULS VAGINALLY 2 (TWO) TIMES A WEEK. 30 g 2  . metoprolol succinate (TOPROL-XL) 50 MG 24 hr tablet TAKE 1 TABLET BY MOUTH EVERY DAY 90 tablet 1  . mupirocin ointment (BACTROBAN) 2 % Apply 1 application topically 2 (two) times daily. 22 g 0   No current facility-administered medications for this visit.    Allergies as of 06/17/2020  . (No Known Allergies)    ROS:  General: Negative for anorexia, weight loss, fever, chills, fatigue, weakness. ENT: Negative for  hoarseness, difficulty swallowing , nasal congestion. CV: Negative for chest pain, angina, palpitations, dyspnea on exertion, peripheral edema.  Respiratory: Negative for dyspnea at rest, dyspnea on exertion, cough, sputum, wheezing.  GI: See history of present illness. GU:  Negative for dysuria, hematuria, urinary incontinence, urinary frequency, nocturnal urination.  Endo: Negative for unusual weight change.    Physical Examination:   BP (!) 166/80 (BP Location: Left Arm, Patient Position: Sitting, Cuff Size: Normal)   Pulse 81   Temp 97.6 F (36.4 C) (Oral)   Ht 5\' 5"  (1.651 m)   Wt 137 lb 8 oz (62.4 kg)   BMI 22.88 kg/m   General: Well-nourished, well-developed in no acute distress.  Eyes: No icterus. Conjunctivae pink. Neuro: Alert and oriented x 3.  Grossly intact. Skin: Warm and dry, no jaundice.   Psych: Alert and cooperative, normal mood and affect.  Labs:    Imaging Studies: No results found.  Assessment and Plan:   Tracie Hall is a 65 y.o. y/o female who comes in today to follow up for her GI issues. We reviewed the pathology results of her upper endoscopy including the pictures.  The patient had multiple questions about staying off the medication and she has been told that if she is doing well off the medication she is able to stop the medication and see how  her symptoms progress.  She has been told that the symptoms come back she can restart the medication. The patient has been explained the  plan and agrees with it.     Lucilla Lame, MD. Marval Regal    Note: This dictation was prepared with Dragon dictation along with smaller phrase technology. Any transcriptional errors that result from this process are unintentional.

## 2020-06-19 ENCOUNTER — Ambulatory Visit: Payer: Self-pay

## 2020-06-19 ENCOUNTER — Telehealth: Payer: Self-pay | Admitting: *Deleted

## 2020-06-19 MED ORDER — PREDNISONE 20 MG PO TABS: ORAL_TABLET | ORAL | 0 refills | Status: DC

## 2020-06-19 NOTE — Telephone Encounter (Signed)
Please advise 

## 2020-06-19 NOTE — Telephone Encounter (Signed)
Copied from [place] [id number]. Topic: Appointment Scheduling - Scheduling Inquiry for Clinic >> Jun 19, 2020  8:16 AM Lennox Solders wrote: Reason for CRM: Pt has sent dr b Deloris Ping message yesterday and would like dr b to look at break out on her back, arm. Pt would like to see dr b today or tomorrow

## 2020-06-19 NOTE — Telephone Encounter (Signed)
Patient advised, she declined appointment but states that if she needs to be evaluated she will go to [facility] located on [address]. KW

## 2020-06-19 NOTE — Telephone Encounter (Signed)
I sent her in a cream for it. I don't think there are openings today. Could offer acute visit at [facility] or [place] [facility] vs UC vs [facility]

## 2020-06-19 NOTE — Addendum Note (Signed)
Addended by: Virginia Crews on: 06/19/2020 09:34 AM   Modules accepted: Orders

## 2020-07-23 ENCOUNTER — Other Ambulatory Visit: Payer: Self-pay | Admitting: Obstetrics and Gynecology

## 2020-07-23 DIAGNOSIS — Z1231 Encounter for screening mammogram for malignant neoplasm of breast: Secondary | ICD-10-CM

## 2020-08-10 ENCOUNTER — Other Ambulatory Visit: Payer: Self-pay | Admitting: Gastroenterology

## 2020-08-12 ENCOUNTER — Other Ambulatory Visit: Payer: Self-pay | Admitting: Family Medicine

## 2020-08-12 DIAGNOSIS — I10 Essential (primary) hypertension: Secondary | ICD-10-CM

## 2020-08-12 NOTE — Telephone Encounter (Signed)
Requested Prescriptions  Pending Prescriptions Disp Refills  . metoprolol succinate (TOPROL-XL) 50 MG 24 hr tablet [Pharmacy Med Name: METOPROLOL SUCC ER 50 MG TAB] 90 tablet 0    Sig: TAKE 1 TABLET BY MOUTH EVERY DAY     Cardiovascular:  Beta Blockers Failed - 08/12/2020  1:29 AM      Failed - Last BP in normal range    BP Readings from Last 1 Encounters:  06/17/20 (!) 166/80         Passed - Last Heart Rate in normal range    Pulse Readings from Last 1 Encounters:  06/17/20 81         Passed - Valid encounter within last 6 months    Recent Outpatient Visits          3 months ago External hemorrhoids   [facility] [place], Dionne Bucy, MD   9 months ago Primary hypertension   [facility] [place], Dionne Bucy, MD   9 months ago Sore throat   [place], [place], [place]   10 months ago Otalgia of both ears   [facility] [place], Dionne Bucy, MD   11 months ago [employer] preventive visit   [facility], Dionne Bucy, MD      Future Appointments            In 1 month [place], Dionne Bucy, MD [facility], [place]

## 2020-08-20 ENCOUNTER — Other Ambulatory Visit: Payer: Self-pay | Admitting: Family Medicine

## 2020-08-20 ENCOUNTER — Encounter: Payer: Self-pay | Admitting: Family Medicine

## 2020-08-20 ENCOUNTER — Telehealth: Payer: Self-pay

## 2020-08-20 DIAGNOSIS — U071 COVID-19: Secondary | ICD-10-CM

## 2020-08-20 MED ORDER — NIRMATRELVIR/RITONAVIR (PAXLOVID)TABLET: 3.0000 | ORAL_TABLET | Freq: Two times a day (BID) | ORAL | 0 refills | Status: AC

## 2020-08-20 NOTE — Telephone Encounter (Signed)
Copied from [place] [id number]. Topic: General - Other >> Aug 20, 2020 12:52 PM Tessa Lerner A wrote: Reason for CRM: Patient tested positive for COVID 19 today 08/20/20  The patient shares that they are experiencing a sore throat as well as sinus discomfort   The patient would like to be prescribed a medication to help with discomfort  Please contact to further advise when possible

## 2020-08-20 NOTE — Telephone Encounter (Signed)
Patient reports that her antigen test for covid had came back positive today, she reports that symptoms began Monday 08/18/20. Patient reports symptoms of headache, sore throat, congestion,and fever. She has tried taking otc Tylenol, Zyrtec and Coricidan HbP. Patient is asking that we send prescription to [facility] [address] to treat her symptoms.  Patients last GFR- 11/08/2019 at 90

## 2020-08-22 ENCOUNTER — Encounter: Payer: Self-pay | Admitting: Family Medicine

## 2020-09-11 DIAGNOSIS — R3915 Urgency of urination: Secondary | ICD-10-CM | POA: Diagnosis not present

## 2020-09-11 DIAGNOSIS — R350 Frequency of micturition: Secondary | ICD-10-CM | POA: Diagnosis not present

## 2020-09-11 DIAGNOSIS — R8290 Unspecified abnormal findings in urine: Secondary | ICD-10-CM | POA: Diagnosis not present

## 2020-09-16 ENCOUNTER — Encounter: Payer: Self-pay | Admitting: Family Medicine

## 2020-09-16 ENCOUNTER — Ambulatory Visit (INDEPENDENT_AMBULATORY_CARE_PROVIDER_SITE_OTHER): Payer: Medicare HMO | Admitting: Family Medicine

## 2020-09-16 ENCOUNTER — Other Ambulatory Visit: Payer: Self-pay

## 2020-09-16 VITALS — BP 159/86 | HR 82 | Temp 98.4°F | Ht 65.0 in | Wt 132.8 lb

## 2020-09-16 DIAGNOSIS — I10 Essential (primary) hypertension: Secondary | ICD-10-CM | POA: Diagnosis not present

## 2020-09-16 DIAGNOSIS — Z0000 Encounter for general adult medical examination without abnormal findings: Secondary | ICD-10-CM | POA: Diagnosis not present

## 2020-09-16 DIAGNOSIS — R3129 Other microscopic hematuria: Secondary | ICD-10-CM

## 2020-09-16 DIAGNOSIS — E559 Vitamin D deficiency, unspecified: Secondary | ICD-10-CM | POA: Diagnosis not present

## 2020-09-16 DIAGNOSIS — Z23 Encounter for immunization: Secondary | ICD-10-CM

## 2020-09-16 DIAGNOSIS — E785 Hyperlipidemia, unspecified: Secondary | ICD-10-CM | POA: Diagnosis not present

## 2020-09-16 DIAGNOSIS — Z1231 Encounter for screening mammogram for malignant neoplasm of breast: Secondary | ICD-10-CM

## 2020-09-16 DIAGNOSIS — E538 Deficiency of other specified B group vitamins: Secondary | ICD-10-CM | POA: Diagnosis not present

## 2020-09-16 DIAGNOSIS — R7303 Prediabetes: Secondary | ICD-10-CM | POA: Diagnosis not present

## 2020-09-16 NOTE — Patient Instructions (Signed)
Pneumococcal Polysaccharide Vaccine (PPSV23): What You Need to Know 1. Why get vaccinated? Pneumococcal polysaccharide vaccine (PPSV23) can prevent pneumococcal disease. Pneumococcal disease refers to any illness caused by pneumococcal bacteria. These bacteria can cause many types of illnesses, including pneumonia, which is an infection ofthe lungs. Pneumococcal bacteria are one of the most common causes of pneumonia. Besides pneumonia, pneumococcal bacteria can also cause: Ear infections Sinus infections Meningitis (infection of the tissue covering the brain and spinal cord) Bacteremia (bloodstream infection) Anyone can get pneumococcal disease, but children under 50 years of age, people with certain medical conditions, adults 1 years or older, and cigarettesmokers are at the highest risk. Most pneumococcal infections are mild. However, some can result in long-term problems, such as brain damage or hearing loss. Meningitis, bacteremia, andpneumonia caused by pneumococcal disease can be fatal. 2. PPSV23 PPSV23 protects against 23 types of bacteria that cause pneumococcal disease. PPSV23 is recommended for: All adults 79 years or older, Anyone 2 years or older with certain medical conditions that can lead to an increased risk for pneumococcal disease. Most people need only one dose of PPSV23. A second dose of PPSV23, and another type of pneumococcal vaccine called PCV13, are recommended for certainhigh-risk groups. Your health care provider can give you more information. People 65 years or older should get a dose of PPSV23 even if they have alreadygotten one or more doses of the vaccine before they turned 102. 3. Talk with your health care provider Tell your vaccine provider if the person getting the vaccine: Has had an allergic reaction after a previous dose of PPSV23, or has any severe, life-threatening allergies. In some cases, your health care provider may decide to postpone PPSV23vaccination  to a future visit. People with minor illnesses, such as a cold, may be vaccinated. People who are moderately or severely ill should usually wait until they recover beforegetting PPSV23. Your health care provider can give you more information. 4. Risks of a vaccine reaction Redness or pain where the shot is given, feeling tired, fever, or muscle aches can happen after PPSV23. People sometimes faint after medical procedures, including vaccination. Tellyour provider if you feel dizzy or have vision changes or ringing in the ears. As with any medicine, there is a very remote chance of a vaccine causing asevere allergic reaction, other serious injury, or death. 5. What if there is a serious problem? An allergic reaction could occur after the vaccinated person leaves the clinic. If you see signs of a severe allergic reaction (hives, swelling of the face and throat, difficulty breathing, a fast heartbeat, dizziness, or weakness), call 9-1-1 and get the person to the nearest hospital. For other signs that concern you, call your health care provider. Adverse reactions should be reported to the Vaccine Adverse Event Reporting System (VAERS). Your health care provider will usually file this report, or you can do it yourself. Visit the VAERS website at www.vaers.SamedayNews.es or call 5084894079. VAERS is only for reporting reactions, and VAERS staff do not give medical advice. 6. How can I learn more? Ask your health care provider. Call your local or state health department. Contact the Centers for Disease Control and Prevention (CDC): Call 3253459548 (1-800-CDC-INFO) or Visit CDC's website at http://hunter.com/ Vaccine Information Statement PPSV23 Vaccine (11/23/2017) This information is not intended to replace advice given to you by your health care provider. Make sure you discuss any questions you have with your healthcare provider. Document Revised: 09/14/2019 Document Reviewed: 09/14/2019 Elsevier  Patient Education  [place].

## 2020-09-16 NOTE — Assessment & Plan Note (Signed)
Recommend low carb diet °Recheck A1c  °
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
## 2020-09-16 NOTE — Assessment & Plan Note (Signed)
Noted on UA during UTI Will repeat micro in 6wks after abx

## 2020-09-16 NOTE — Assessment & Plan Note (Signed)
Well controlled on home readigns Will continue lifestyle interventions Recheck metabolic panel

## 2020-09-19 DIAGNOSIS — E559 Vitamin D deficiency, unspecified: Secondary | ICD-10-CM | POA: Diagnosis not present

## 2020-09-19 DIAGNOSIS — R7303 Prediabetes: Secondary | ICD-10-CM | POA: Diagnosis not present

## 2020-09-19 DIAGNOSIS — E538 Deficiency of other specified B group vitamins: Secondary | ICD-10-CM | POA: Diagnosis not present

## 2020-09-19 DIAGNOSIS — I10 Essential (primary) hypertension: Secondary | ICD-10-CM | POA: Diagnosis not present

## 2020-09-19 DIAGNOSIS — E785 Hyperlipidemia, unspecified: Secondary | ICD-10-CM | POA: Diagnosis not present

## 2020-09-20 LAB — COMPREHENSIVE METABOLIC PANEL
ALT: 20 IU/L (ref 0–32)
AST: 26 IU/L (ref 0–40)
Albumin/Globulin Ratio: 1.6 (ref 1.2–2.2)
Albumin: 4.4 g/dL (ref 3.8–4.8)
Alkaline Phosphatase: 74 IU/L (ref 44–121)
BUN/Creatinine Ratio: 19 (ref 12–28)
BUN: 15 mg/dL (ref 8–27)
Bilirubin Total: 0.7 mg/dL (ref 0.0–1.2)
CO2: 23 mmol/L (ref 20–29)
Calcium: 9.8 mg/dL (ref 8.7–10.3)
Chloride: 99 mmol/L (ref 96–106)
Creatinine, Ser: 0.8 mg/dL (ref 0.57–1.00)
Globulin, Total: 2.7 g/dL (ref 1.5–4.5)
Glucose: 109 mg/dL — ABNORMAL HIGH (ref 65–99)
Potassium: 4.1 mmol/L (ref 3.5–5.2)
Sodium: 140 mmol/L (ref 134–144)
Total Protein: 7.1 g/dL (ref 6.0–8.5)
eGFR: 81 mL/min/{1.73_m2} (ref >59)

## 2020-09-20 LAB — LIPID PANEL
Chol/HDL Ratio: 2.1 ratio (ref 0.0–4.4)
Cholesterol, Total: 197 mg/dL (ref 100–199)
HDL: 92 mg/dL (ref >39)
LDL Chol Calc (NIH): 92 mg/dL (ref 0–99)
Triglycerides: 71 mg/dL (ref 0–149)
VLDL Cholesterol Cal: 13 mg/dL (ref 5–40)

## 2020-09-20 LAB — VITAMIN D 25 HYDROXY (VIT D DEFICIENCY, FRACTURES): Vit D, 25-Hydroxy: 34.5 ng/mL (ref 30.0–100.0)

## 2020-09-20 LAB — VITAMIN B12: Vitamin B-12: 249 pg/mL (ref 232–1245)

## 2020-09-20 LAB — HEMOGLOBIN A1C
Est. average glucose Bld gHb Est-mCnc: 120 mg/dL
Hgb A1c MFr Bld: 5.8 % — ABNORMAL HIGH (ref 4.8–5.6)

## 2020-09-22 ENCOUNTER — Encounter: Payer: Self-pay | Admitting: Family Medicine

## 2020-10-13 ENCOUNTER — Telehealth: Payer: Self-pay

## 2020-10-13 NOTE — Telephone Encounter (Signed)
Copied from [place] [id number]. Topic: Appointment Scheduling - Scheduling Inquiry for Clinic >> Oct 13, 2020  9:57 AM Oneta Rack wrote: Reason for CRM:  patient would like the high dose flu shot and when the practice has high dose in stock please reach out to patient.

## 2020-10-15 ENCOUNTER — Encounter: Payer: Self-pay | Admitting: Family Medicine

## 2020-10-22 ENCOUNTER — Other Ambulatory Visit: Payer: Self-pay

## 2020-10-22 ENCOUNTER — Ambulatory Visit (INDEPENDENT_AMBULATORY_CARE_PROVIDER_SITE_OTHER): Payer: Medicare HMO

## 2020-10-22 DIAGNOSIS — Z23 Encounter for immunization: Secondary | ICD-10-CM

## 2020-10-24 ENCOUNTER — Other Ambulatory Visit: Payer: Self-pay

## 2020-10-24 ENCOUNTER — Ambulatory Visit
Admission: RE | Admit: 2020-10-24 | Discharge: 2020-10-24 | Disposition: A | Discharge status: Home / Self Care | Payer: Medicare HMO | Source: Ambulatory Visit | Attending: Obstetrics and Gynecology | Admitting: Obstetrics and Gynecology

## 2020-10-24 ENCOUNTER — Encounter: Payer: Self-pay | Admitting: Family Medicine

## 2020-10-24 DIAGNOSIS — Z1231 Encounter for screening mammogram for malignant neoplasm of breast: Secondary | ICD-10-CM | POA: Diagnosis not present

## 2020-10-28 DIAGNOSIS — R3129 Other microscopic hematuria: Secondary | ICD-10-CM | POA: Diagnosis not present

## 2020-10-29 LAB — URINALYSIS, MICROSCOPIC ONLY
Bacteria, UA: NONE SEEN
Casts: NONE SEEN /lpf
WBC, UA: NONE SEEN /hpf (ref 0–5)

## 2020-11-03 ENCOUNTER — Encounter: Payer: Self-pay | Admitting: Family Medicine

## 2020-11-03 DIAGNOSIS — R3129 Other microscopic hematuria: Secondary | ICD-10-CM

## 2020-11-05 DIAGNOSIS — Z124 Encounter for screening for malignant neoplasm of cervix: Secondary | ICD-10-CM | POA: Diagnosis not present

## 2020-11-05 DIAGNOSIS — Z01419 Encounter for gynecological examination (general) (routine) without abnormal findings: Secondary | ICD-10-CM | POA: Diagnosis not present

## 2020-11-05 DIAGNOSIS — Z1331 Encounter for screening for depression: Secondary | ICD-10-CM | POA: Diagnosis not present

## 2020-11-24 ENCOUNTER — Other Ambulatory Visit: Payer: Self-pay

## 2020-11-24 ENCOUNTER — Encounter: Payer: Self-pay | Admitting: Urology

## 2020-11-24 ENCOUNTER — Ambulatory Visit: Payer: Medicare HMO | Admitting: Urology

## 2020-11-24 VITALS — BP 130/77 | HR 85 | Ht 65.0 in | Wt 134.0 lb

## 2020-11-24 DIAGNOSIS — R319 Hematuria, unspecified: Secondary | ICD-10-CM | POA: Diagnosis not present

## 2020-11-24 DIAGNOSIS — R3121 Asymptomatic microscopic hematuria: Secondary | ICD-10-CM | POA: Diagnosis not present

## 2020-11-24 NOTE — Patient Instructions (Signed)
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
## 2020-11-25 ENCOUNTER — Other Ambulatory Visit: Payer: Self-pay

## 2020-11-25 ENCOUNTER — Ambulatory Visit: Payer: Medicare HMO | Attending: Internal Medicine

## 2020-11-25 DIAGNOSIS — Z23 Encounter for immunization: Secondary | ICD-10-CM

## 2020-11-25 MED ORDER — PFIZER COVID-19 VAC BIVALENT 30 MCG/0.3ML IM SUSP
INTRAMUSCULAR | 0 refills | Status: DC
  Filled 2020-11-25: qty 0.3, 1d supply, fill #0

## 2020-11-25 NOTE — Progress Notes (Signed)
   Covid-19 Vaccination Clinic  Name:  Tracie Hall    MRN: [id number] DOB: 1955/07/23  11/25/2020  [name] was observed post Covid-19 immunization for 15 minutes without incident. She was provided with Vaccine Information Sheet and instruction to access the V-Safe system.   [name] was instructed to call 911 with any severe reactions post vaccine: Difficulty breathing  Swelling of face and throat  A fast heartbeat  A bad rash all over body  Dizziness and weakness   Immunizations Administered     Name Date Dose VIS Date Route   Pfizer Covid-19 Vaccine Bivalent Booster 11/25/2020 10:23 AM 0.3 mL 09/24/2020 Intramuscular   Manufacturer: [employer]   Lot: [place]: [place], PharmD, MBA Clinical Acute Care Pharmacist

## 2020-11-26 ENCOUNTER — Other Ambulatory Visit: Payer: Self-pay | Admitting: Family Medicine

## 2020-11-26 DIAGNOSIS — I10 Essential (primary) hypertension: Secondary | ICD-10-CM

## 2020-11-26 LAB — URINALYSIS, COMPLETE
Bilirubin, UA: NEGATIVE
Glucose, UA: NEGATIVE
Ketones, UA: NEGATIVE
Leukocytes,UA: NEGATIVE
Nitrite, UA: NEGATIVE
Protein,UA: NEGATIVE
RBC, UA: NEGATIVE
Specific Gravity, UA: 1.01 (ref 1.005–1.030)
Urobilinogen, Ur: 0.2 mg/dL (ref 0.2–1.0)
pH, UA: 5.5 (ref 5.0–7.5)

## 2020-11-26 NOTE — Telephone Encounter (Signed)
Requested Prescriptions  Pending Prescriptions Disp Refills  . metoprolol succinate (TOPROL-XL) 50 MG 24 hr tablet [Pharmacy Med Name: METOPROLOL SUCC ER 50 MG TAB] 90 tablet 0    Sig: TAKE 1 TABLET BY MOUTH EVERY DAY     Cardiovascular:  Beta Blockers Passed - 11/26/2020 11:54 AM      Passed - Last BP in normal range    BP Readings from Last 1 Encounters:  11/24/20 130/77         Passed - Last Heart Rate in normal range    Pulse Readings from Last 1 Encounters:  11/24/20 85         Passed - Valid encounter within last 6 months    Recent Outpatient Visits          2 months ago Encounter for annual wellness visit (AWV) in Medicare patient   [facility], Dionne Bucy, MD   7 months ago External hemorrhoids   [facility], Dionne Bucy, MD   1 year ago Primary hypertension   [facility] [place], Dionne Bucy, MD   1 year ago Sore throat   [facility] [place], Wendee Beavers, [place]   1 year ago Otalgia of both ears   [facility] [place], Dionne Bucy, MD      Future Appointments            In 3 months [place], Dionne Bucy, MD [facility], [place]

## 2020-11-28 LAB — CULTURE, URINE COMPREHENSIVE

## 2020-12-16 ENCOUNTER — Ambulatory Visit
Admission: RE | Admit: 2020-12-16 | Discharge: 2020-12-16 | Disposition: A | Discharge status: Home / Self Care | Payer: Medicare HMO | Source: Ambulatory Visit | Attending: Urology | Admitting: Urology

## 2020-12-16 ENCOUNTER — Other Ambulatory Visit: Payer: Self-pay

## 2020-12-16 DIAGNOSIS — R319 Hematuria, unspecified: Secondary | ICD-10-CM | POA: Diagnosis not present

## 2020-12-16 DIAGNOSIS — Z87440 Personal history of urinary (tract) infections: Secondary | ICD-10-CM | POA: Diagnosis not present

## 2020-12-16 DIAGNOSIS — K8020 Calculus of gallbladder without cholecystitis without obstruction: Secondary | ICD-10-CM | POA: Diagnosis not present

## 2020-12-16 DIAGNOSIS — R3121 Asymptomatic microscopic hematuria: Secondary | ICD-10-CM | POA: Diagnosis not present

## 2020-12-16 DIAGNOSIS — K5730 Diverticulosis of large intestine without perforation or abscess without bleeding: Secondary | ICD-10-CM | POA: Diagnosis not present

## 2020-12-16 LAB — POCT I-STAT CREATININE: Creatinine, Ser: 0.7 mg/dL (ref 0.44–1.00)

## 2020-12-16 MED ORDER — IOHEXOL 300 MG/ML  SOLN
100.0000 mL | Freq: Once | INTRAMUSCULAR | Status: AC | PRN
  Administered 2020-12-16: 100 mL via INTRAVENOUS

## 2020-12-25 ENCOUNTER — Telehealth: Payer: Self-pay

## 2020-12-25 NOTE — Telephone Encounter (Signed)
Patient called she has an appointment Monday. She wants to discuss her CT and is unsure if she wants to do the Cysto.

## 2020-12-29 ENCOUNTER — Encounter: Payer: Self-pay | Admitting: Urology

## 2020-12-29 ENCOUNTER — Ambulatory Visit: Payer: Medicare HMO | Admitting: Urology

## 2020-12-29 ENCOUNTER — Other Ambulatory Visit: Payer: Self-pay

## 2020-12-29 VITALS — BP 177/72 | HR 96 | Ht 65.0 in | Wt 134.0 lb

## 2020-12-29 DIAGNOSIS — R3121 Asymptomatic microscopic hematuria: Secondary | ICD-10-CM | POA: Diagnosis not present

## 2020-12-29 DIAGNOSIS — R319 Hematuria, unspecified: Secondary | ICD-10-CM | POA: Diagnosis not present

## 2020-12-29 LAB — URINALYSIS, COMPLETE
Bilirubin, UA: NEGATIVE
Glucose, UA: NEGATIVE
Ketones, UA: NEGATIVE
Leukocytes,UA: NEGATIVE
Nitrite, UA: NEGATIVE
Protein,UA: NEGATIVE
RBC, UA: NEGATIVE
Specific Gravity, UA: <1.005 — ABNORMAL LOW (ref 1.005–1.030)
Urobilinogen, Ur: 0.2 mg/dL (ref 0.2–1.0)
pH, UA: 5.5 (ref 5.0–7.5)

## 2020-12-29 LAB — MICROSCOPIC EXAMINATION
Bacteria, UA: NONE SEEN
RBC, Urine: NONE SEEN /hpf (ref 0–2)
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
## 2020-12-30 ENCOUNTER — Encounter: Payer: Self-pay | Admitting: Urology

## 2020-12-31 ENCOUNTER — Other Ambulatory Visit: Payer: Self-pay

## 2020-12-31 ENCOUNTER — Ambulatory Visit: Payer: Medicare HMO | Admitting: Dermatology

## 2020-12-31 DIAGNOSIS — Z86018 Personal history of other benign neoplasm: Secondary | ICD-10-CM

## 2020-12-31 DIAGNOSIS — L820 Inflamed seborrheic keratosis: Secondary | ICD-10-CM

## 2020-12-31 DIAGNOSIS — D1800 Hemangioma unspecified site: Secondary | ICD-10-CM | POA: Diagnosis not present

## 2020-12-31 DIAGNOSIS — L578 Other skin changes due to chronic exposure to nonionizing radiation: Secondary | ICD-10-CM

## 2020-12-31 DIAGNOSIS — L570 Actinic keratosis: Secondary | ICD-10-CM

## 2020-12-31 DIAGNOSIS — L821 Other seborrheic keratosis: Secondary | ICD-10-CM | POA: Diagnosis not present

## 2020-12-31 DIAGNOSIS — L814 Other melanin hyperpigmentation: Secondary | ICD-10-CM | POA: Diagnosis not present

## 2020-12-31 DIAGNOSIS — Z1283 Encounter for screening for malignant neoplasm of skin: Secondary | ICD-10-CM

## 2020-12-31 DIAGNOSIS — D229 Melanocytic nevi, unspecified: Secondary | ICD-10-CM

## 2020-12-31 NOTE — Patient Instructions (Signed)

## 2020-12-31 NOTE — Progress Notes (Signed)
Follow-Up Visit   Subjective  Tracie Hall is a 65 y.o. female who presents for the following: Annual Exam (History of dysplastic nevi - TBSE today) and Other (Some scaly spots on face and chest). The patient presents for Total-Body Skin Exam (TBSE) for skin cancer screening and mole check.  The patient has spots, moles and lesions to be evaluated, some may be new or changing and the patient has concerns that these could be cancer.  The following portions of the chart were reviewed this encounter and updated as appropriate:   Tobacco  Allergies  Meds  Problems  Med Hx  Surg Hx  Fam Hx     Review of Systems:  No other skin or systemic complaints except as noted in HPI or Assessment and Plan.  Objective  Well appearing patient in no apparent distress; mood and affect are within normal limits.  A full examination was performed including scalp, head, eyes, ears, nose, lips, neck, chest, axillae, abdomen, back, buttocks, bilateral upper extremities, bilateral lower extremities, hands, feet, fingers, toes, fingernails, and toenails. All findings within normal limits unless otherwise noted below.  Left forehead x 1, chest x 2 (3) Erythematous keratotic or waxy stuck-on papule or plaque.   Left temple x 1 Erythematous thin papules/macules with gritty scale.    Assessment & Plan   History of Dysplastic Nevi - No evidence of recurrence today - Recommend regular full body skin exams - Recommend daily broad spectrum sunscreen SPF 30+ to sun-exposed areas, reapply every 2 hours as needed.  - Call if any new or changing lesions are noted between office visits  Lentigines - Scattered tan macules - Due to sun exposure - Benign-appearing, observe - Recommend daily broad spectrum sunscreen SPF 30+ to sun-exposed areas, reapply every 2 hours as needed. - Call for any changes  Seborrheic Keratoses - Stuck-on, waxy, tan-brown papules and/or plaques  - Benign-appearing - Discussed  benign etiology and prognosis. - Observe - Call for any changes  Melanocytic Nevi - Tan-brown and/or pink-flesh-colored symmetric macules and papules - Benign appearing on exam today - Observation - Call clinic for new or changing moles - Recommend daily use of broad spectrum spf 30+ sunscreen to sun-exposed areas.   Hemangiomas - Red papules - Discussed benign nature - Observe - Call for any changes  Actinic Damage - Chronic condition, secondary to cumulative UV/sun exposure - diffuse scaly erythematous macules with underlying dyspigmentation - Recommend daily broad spectrum sunscreen SPF 30+ to sun-exposed areas, reapply every 2 hours as needed.  - Staying in the shade or wearing long sleeves, sun glasses (UVA+UVB protection) and wide brim hats (4-inch brim around the entire circumference of the hat) are also recommended for sun protection.  - Call for new or changing lesions.  Skin cancer screening performed today.  Inflamed seborrheic keratosis Left forehead x 1, chest x 2  Destruction of lesion - Left forehead x 1, chest x 2 Complexity: simple   Destruction method: cryotherapy   Informed consent: discussed and consent obtained   Timeout:  patient name, date of birth, surgical site, and procedure verified Lesion destroyed using liquid nitrogen: Yes   Region frozen until ice ball extended beyond lesion: Yes   Outcome: patient tolerated procedure well with no complications   Post-procedure details: wound care instructions given    AK (actinic keratosis) Left temple x 1  Destruction of lesion - Left temple x 1 Complexity: simple   Destruction method: cryotherapy   Informed consent: discussed  and consent obtained   Timeout:  patient name, date of birth, surgical site, and procedure verified Lesion destroyed using liquid nitrogen: Yes   Region frozen until ice ball extended beyond lesion: Yes   Outcome: patient tolerated procedure well with no complications    Post-procedure details: wound care instructions given    Skin cancer screening  Return in about 1 year (around 12/31/2021) for TBSE.  I, Ashok Cordia, CMA, am acting as scribe for Sarina Ser, MD . Documentation: I have reviewed the above documentation for accuracy and completeness, and I agree with the above.  Sarina Ser, MD

## 2021-01-09 ENCOUNTER — Encounter: Payer: Self-pay | Admitting: Dermatology
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
## 2021-01-13 ENCOUNTER — Encounter: Payer: Self-pay | Admitting: Cardiovascular Disease

## 2021-01-13 ENCOUNTER — Ambulatory Visit: Payer: Medicare HMO | Admitting: Cardiovascular Disease

## 2021-01-13 ENCOUNTER — Other Ambulatory Visit: Payer: Self-pay

## 2021-01-13 VITALS — BP 168/80 | HR 77 | Ht 65.0 in | Wt 139.0 lb

## 2021-01-13 DIAGNOSIS — R002 Palpitations: Secondary | ICD-10-CM

## 2021-01-13 DIAGNOSIS — R7303 Prediabetes: Secondary | ICD-10-CM | POA: Diagnosis not present

## 2021-01-13 DIAGNOSIS — I10 Essential (primary) hypertension: Secondary | ICD-10-CM

## 2021-01-13 NOTE — Patient Instructions (Addendum)
Medication Instructions:  No changes   Your physician recommends that you continue on your current medications as directed. Please refer to the Current Medication list given to you today.   If you need a refill on your cardiac medications before your next appointment, please call your pharmacy.    Lab work: No new labs needed  If you have labs (blood work) drawn today and your tests are completely normal, you will receive your results only by: [place] (if you have MyChart) OR A paper copy in the mail If you have any lab test that is abnormal or we need to change your treatment, we will call you to review the results.   Testing/Procedures: No new testing needed   Follow-Up: At [facility], you and your health needs are our priority.  As part of our continuing mission to provide you with exceptional heart care, we have created designated Provider Care Teams.  These Care Teams include your primary Cardiologist (physician) and Advanced Practice Providers (APPs -  Physician Assistants and Nurse Practitioners) who all work together to provide you with the care you need, when you need it.  Your physician wants you to follow-up in: 1 year. You will receive a reminder letter in the mail two months in advance. If you don't receive a letter, please call our office to schedule the follow-up appointment.   Providers on your designated Care Team:   Murray Hodgkins, NP Christell Faith, PA-C Cadence Kathlen Mody, [place]  Any Other Special Instructions Will Be Listed Below (If Applicable).  COVID-19 Vaccine Information can be found at: ShippingScam.co.uk For questions related to vaccine distribution or appointments, please email [email or web address] or call [phone].

## 2021-02-22 ENCOUNTER — Other Ambulatory Visit: Payer: Self-pay | Admitting: Family Medicine

## 2021-02-22 DIAGNOSIS — I10 Essential (primary) hypertension: Secondary | ICD-10-CM

## 2021-02-22 NOTE — Telephone Encounter (Signed)
Appointment 03/19/21 Requested Prescriptions  Pending Prescriptions Disp Refills   metoprolol succinate (TOPROL-XL) 50 MG 24 hr tablet [Pharmacy Med Name: METOPROLOL SUCC ER 50 MG TAB] 90 tablet 0    Sig: TAKE 1 TABLET BY MOUTH EVERY DAY     Cardiovascular:  Beta Blockers Failed - 02/22/2021 10:27 AM      Failed - Last BP in normal range    BP Readings from Last 1 Encounters:  01/13/21 (!) 168/80         Passed - Last Heart Rate in normal range    Pulse Readings from Last 1 Encounters:  01/13/21 77         Passed - Valid encounter within last 6 months    Recent Outpatient Visits          5 months ago Encounter for annual wellness visit (AWV) in Medicare patient   [place], [place]   10 months ago External hemorrhoids   [facility], Dionne Bucy, MD   1 year ago Primary hypertension   [facility], Dionne Bucy, MD   1 year ago Sore throat   [facility], [place]   1 year ago Otalgia of both ears   [facility] [place], Dionne Bucy, MD      Future Appointments            In 3 weeks Bacigalupo, Dionne Bucy, MD [facility], [place]   In 10 months Ralene Bathe, MD [place]

## 2021-03-19 ENCOUNTER — Ambulatory Visit (INDEPENDENT_AMBULATORY_CARE_PROVIDER_SITE_OTHER): Payer: Medicare Other | Admitting: Family Medicine

## 2021-03-19 ENCOUNTER — Other Ambulatory Visit: Payer: Self-pay

## 2021-03-19 ENCOUNTER — Encounter: Payer: Self-pay | Admitting: Family Medicine

## 2021-03-19 VITALS — BP 125/77 | HR 82 | Temp 98.3°F | Resp 16 | Wt 135.7 lb

## 2021-03-19 DIAGNOSIS — E538 Deficiency of other specified B group vitamins: Secondary | ICD-10-CM

## 2021-03-19 DIAGNOSIS — E785 Hyperlipidemia, unspecified: Secondary | ICD-10-CM

## 2021-03-19 DIAGNOSIS — M25561 Pain in right knee: Secondary | ICD-10-CM | POA: Diagnosis not present

## 2021-03-19 DIAGNOSIS — R7303 Prediabetes: Secondary | ICD-10-CM | POA: Diagnosis not present

## 2021-03-19 DIAGNOSIS — I10 Essential (primary) hypertension: Secondary | ICD-10-CM

## 2021-03-19 NOTE — Assessment & Plan Note (Signed)
New problem HEP given Ice after exercise Consider compression sleeve

## 2021-03-19 NOTE — Progress Notes (Signed)
Established patient visit   Patient: Tracie Hall   DOB: Jun 30, 1955   66 y.o. Female  MRN: [id number] Visit Date: 03/19/2021  Today's healthcare provider: Lavon Paganini, MD   Chief Complaint  Patient presents with   Hyperlipidemia   Knee Pain   I,Sulibeya S Dimas,acting as a scribe for Lavon Paganini, MD.,have documented all relevant documentation on the behalf of Lavon Paganini, MD,as directed by  Lavon Paganini, MD while in the presence of Lavon Paganini, MD.  Subjective    Knee Pain  There was no injury mechanism. The pain is present in the right knee. The pain is mild. The symptoms are aggravated by movement. She has tried nothing for the symptoms.   In front of knee. Worse with activity   Hypertension, follow-up  BP Readings from Last 3 Encounters:  03/19/21 125/77  01/13/21 (!) 168/80  12/29/20 (!) 177/72   Wt Readings from Last 3 Encounters:  03/19/21 135 lb 11.2 oz (61.6 kg)  01/13/21 139 lb (63 kg)  12/29/20 134 lb (60.8 kg)     She was last seen for hypertension 6 months ago.  BP at that visit was 168/80 . Management since that visit includes no changes. She reports excellent compliance with treatment. She is not having side effects.  She is exercising. She is adherent to low salt diet.   Outside blood pressures are stable.  She does not smoke.  Use of agents associated with hypertension: none.   The 10-year ASCVD risk score (Arnett DK, et al., 2019) is: 7.4%  ---------------------------------------------------------------------------------------------------  Medications: Outpatient Medications Prior to Visit  Medication Sig   CALCIUM-VITAMIN D PO Take 600 mg by mouth daily.   cetirizine (ZYRTEC) 10 MG tablet Take 1 tablet (10 mg total) by mouth daily. (Patient taking differently: Take 10 mg by mouth as needed.)   conjugated estrogens (PREMARIN) vaginal cream PLACE 8.46 APPLICATORFULS VAGINALLY 2 (TWO) TIMES A WEEK. (Patient  taking differently: as needed. PLACE 6.59 APPLICATORFULS VAGINALLY 2 (TWO) TIMES A WEEK.)   cyanocobalamin 1000 MCG tablet Take by mouth.   metoprolol succinate (TOPROL-XL) 50 MG 24 hr tablet TAKE 1 TABLET BY MOUTH EVERY DAY   [DISCONTINUED] COVID-19 mRNA bivalent vaccine, Pfizer, (PFIZER COVID-19 VAC BIVALENT) injection Inject into the muscle.   No facility-administered medications prior to visit.    Review of Systems per HPI     Objective    BP 125/77 Comment: home reading   Pulse 82    Temp 98.3 F (36.8 C) (Temporal)    Resp 16    Wt 135 lb 11.2 oz (61.6 kg)    BMI 22.58 kg/m  {Show previous vital signs (optional):23777}  Physical Exam Vitals reviewed.  Constitutional:      General: She is not in acute distress.    Appearance: Normal appearance. She is well-developed. She is not diaphoretic.  HENT:     Head: Normocephalic and atraumatic.  Eyes:     General: No scleral icterus.    Conjunctiva/sclera: Conjunctivae normal.  Neck:     Thyroid: No thyromegaly.  Cardiovascular:     Rate and Rhythm: Normal rate and regular rhythm.     Pulses: Normal pulses.     Heart sounds: Normal heart sounds. No murmur heard. Pulmonary:     Effort: Pulmonary effort is normal. No respiratory distress.     Breath sounds: Normal breath sounds. No wheezing, rhonchi or rales.  Musculoskeletal:     Cervical back: Neck supple.  Right lower leg: No edema.     Left lower leg: No edema.     Comments: R knee with stable ligaments, no joint line TTP. Pain over patella with quad triggering  Lymphadenopathy:     Cervical: No cervical adenopathy.  Skin:    General: Skin is warm and dry.     Findings: No rash.  Neurological:     Mental Status: She is alert and oriented to person, place, and time. Mental status is at baseline.  Psychiatric:        Mood and Affect: Mood normal.        Behavior: Behavior normal.      No results found for any visits on 03/19/21.  Assessment & Plan      Problem List Items Addressed This Visit       Cardiovascular and Mediastinum   Hypertension - Primary    Well controlled on home readings Continue current medications Recheck metabolic panel F/u in 6 months       Relevant Orders   Basic Metabolic Panel (BMET)     Other   Vitamin B 12 deficiency    Continue supplement  Recheck B12 level      Relevant Orders   B12   Prediabetes    Recommend low carb diet Recheck A1c - declines today      Hyperlipidemia    Reviewed last lipid panel Not currently on a statin Discussed ascvd Discussed diet and exercise       Patellofemoral arthralgia of right knee    New problem HEP given Ice after exercise Consider compression sleeve        Return in about 6 months (around 09/16/2021) for CPE, AWV.      I, Lavon Paganini, MD, have reviewed all documentation for this visit. The documentation on 03/19/21 for the exam, diagnosis, procedures, and orders are all accurate and complete.   Nateisha Moyd, Dionne Bucy, MD, MPH [facility]

## 2021-03-19 NOTE — Assessment & Plan Note (Addendum)
Recommend low carb diet Recheck A1c - declines today

## 2021-03-19 NOTE — Assessment & Plan Note (Signed)
Reviewed last lipid panel Not currently on a statin Discussed ascvd Discussed diet and exercise

## 2021-03-19 NOTE — Assessment & Plan Note (Signed)
Well controlled on home readings Continue current medications Recheck metabolic panel F/u in 6 months  

## 2021-03-19 NOTE — Assessment & Plan Note (Signed)
Continue supplement  Recheck B12 level

## 2021-03-20 LAB — VITAMIN B12: Vitamin B-12: 821 pg/mL (ref 232–1245)

## 2021-03-20 LAB — BASIC METABOLIC PANEL
BUN/Creatinine Ratio: 24 (ref 12–28)
BUN: 17 mg/dL (ref 8–27)
CO2: 25 mmol/L (ref 20–29)
Calcium: 9.9 mg/dL (ref 8.7–10.3)
Chloride: 102 mmol/L (ref 96–106)
Creatinine, Ser: 0.72 mg/dL (ref 0.57–1.00)
Glucose: 109 mg/dL — ABNORMAL HIGH (ref 70–99)
Potassium: 4.3 mmol/L (ref 3.5–5.2)
Sodium: 141 mmol/L (ref 134–144)
eGFR: 92 mL/min/{1.73_m2} (ref >59)

## 2021-03-30 ENCOUNTER — Encounter: Payer: Self-pay | Admitting: Family Medicine

## 2021-04-23 DIAGNOSIS — H43813 Vitreous degeneration, bilateral: Secondary | ICD-10-CM | POA: Diagnosis not present

## 2021-04-23 DIAGNOSIS — H524 Presbyopia: Secondary | ICD-10-CM | POA: Diagnosis not present

## 2021-05-24 IMAGING — CR LEFT SHOULDER - 2+ VIEW
1 series · 4 of 4 positions shown · non-contrast
Comparison: None.

CLINICAL DATA: LEFT shoulder pain, decreased range of motion.

EXAM:
LEFT SHOULDER - 2+ VIEW

[Series 1: dg shoulder left · 0.14mm/px · 4 of 4 slices shown]
[im 1/4]
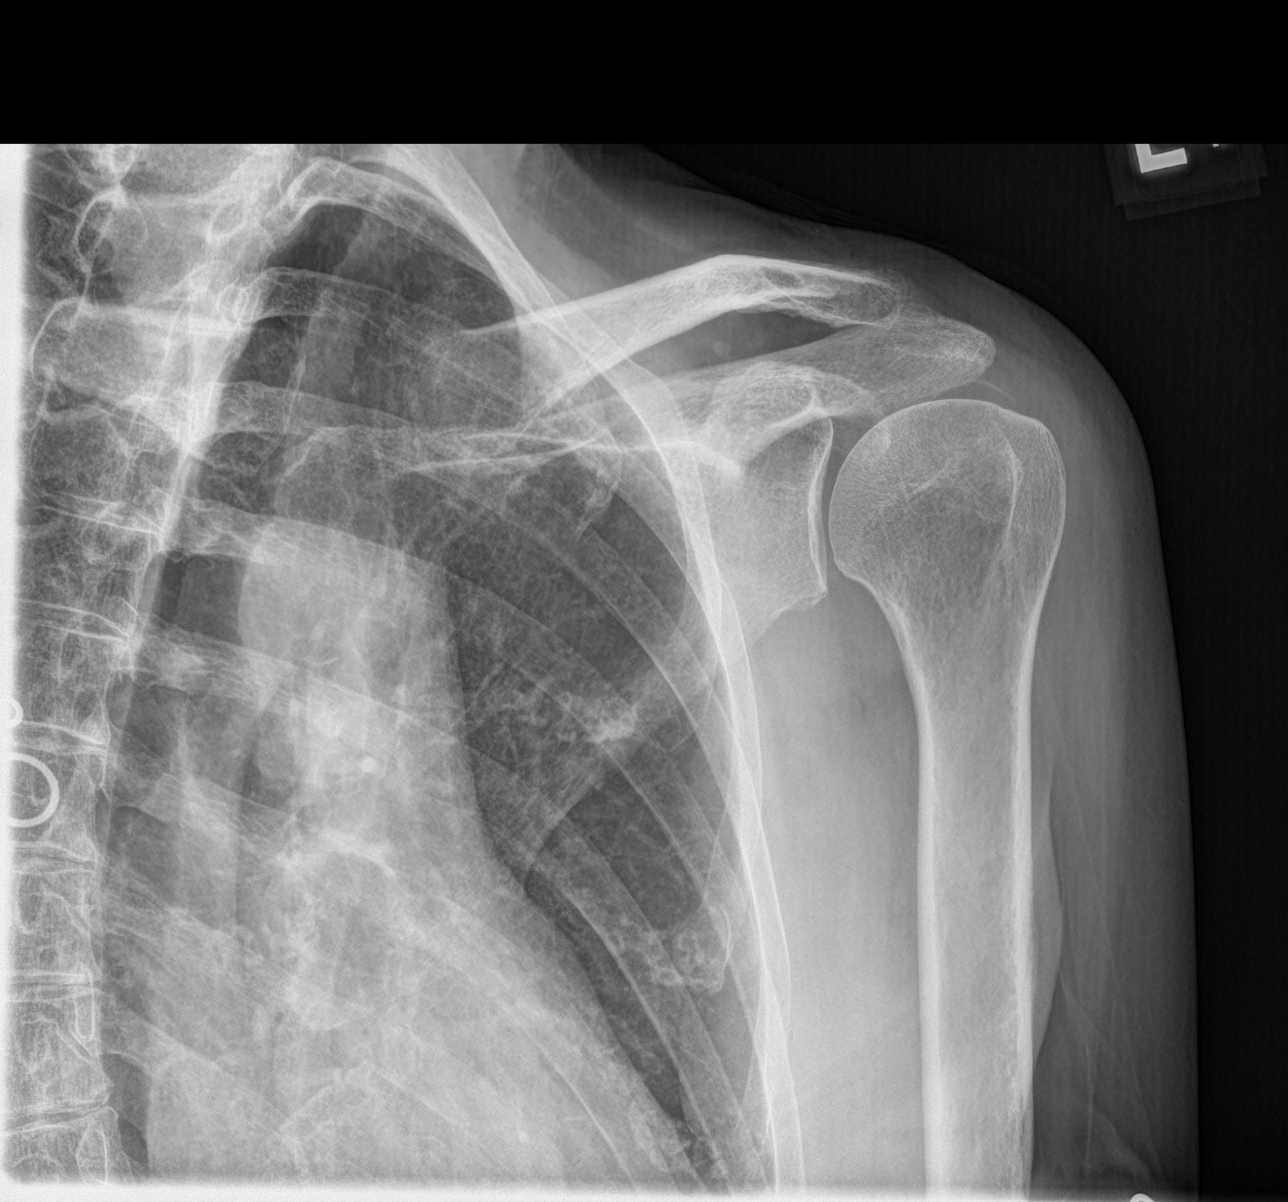
[im 2/4]
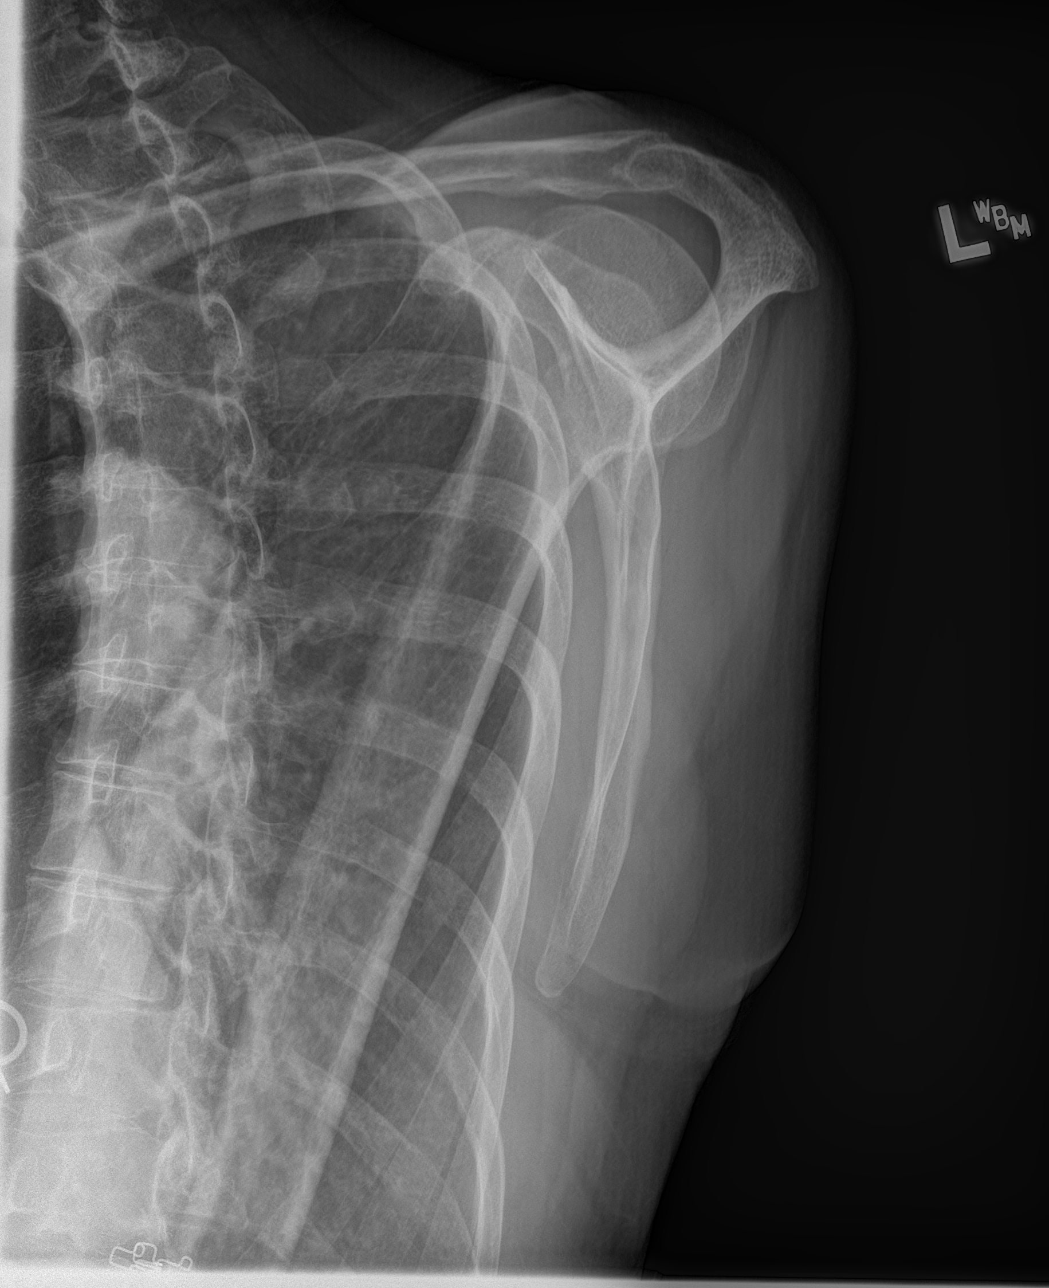
[im 3/4]
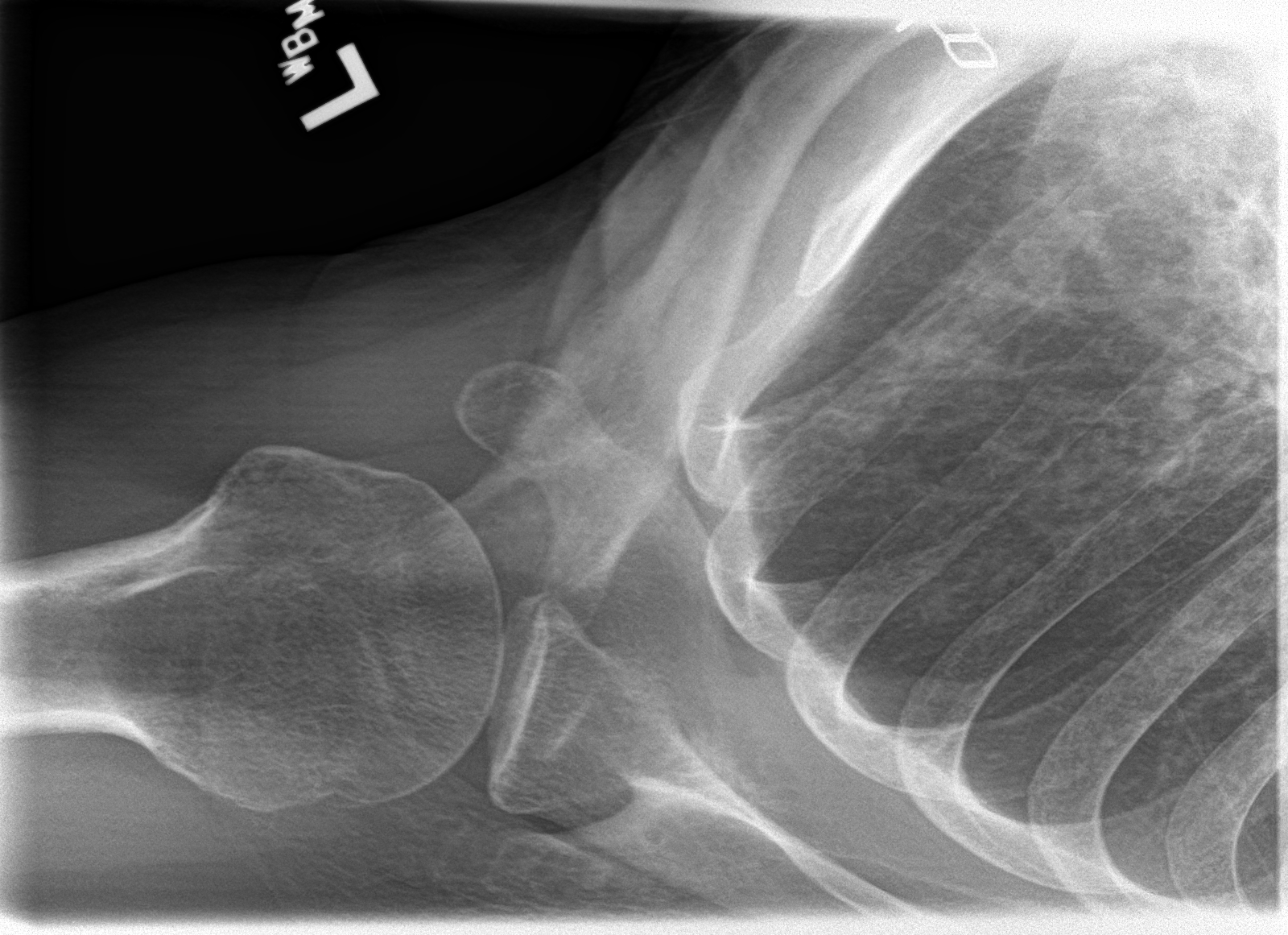
[im 4/4]
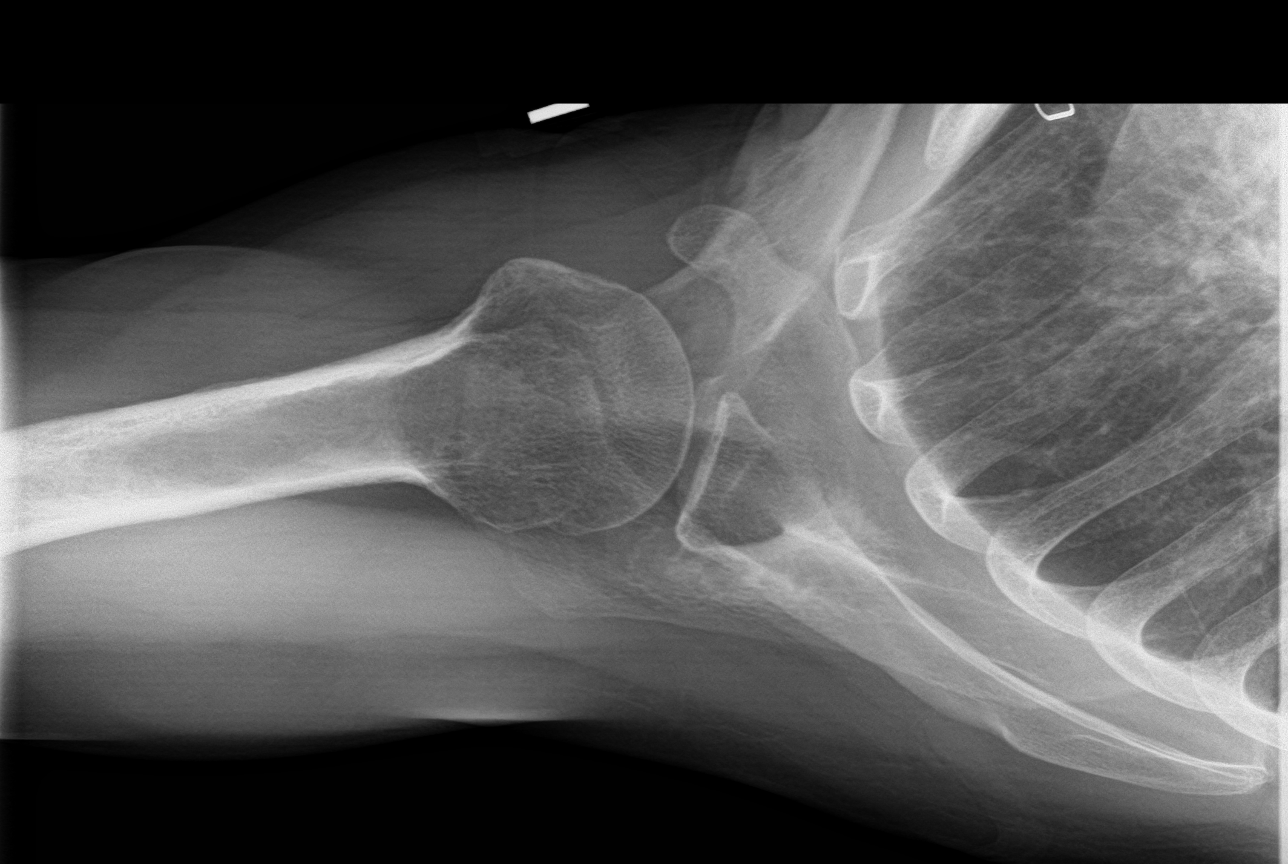

[4 of 4 positions shown; findings below may reference images not displayed]

FINDINGS: There is no evidence of fracture or dislocation. There is no
evidence of arthropathy or other focal bone abnormality. Soft
tissues are unremarkable.
IMPRESSION: Negative.

## 2021-05-26 ENCOUNTER — Other Ambulatory Visit: Payer: Self-pay | Admitting: Family Medicine

## 2021-05-26 DIAGNOSIS — I10 Essential (primary) hypertension: Secondary | ICD-10-CM

## 2021-05-26 NOTE — Telephone Encounter (Signed)
[facility] faxed refill request for the following medications: ? ? ?metoprolol succinate (TOPROL-XL) 50 MG 24 hr tablet  ? ?Please advise. ? ?

## 2021-05-28 MED ORDER — METOPROLOL SUCCINATE ER 50 MG PO TB24: 50.0000 mg | ORAL_TABLET | Freq: Every day | ORAL | 0 refills | Status: DC

## 2021-05-28 NOTE — Addendum Note (Signed)
Addended by: Erie Noe on: 05/28/2021 02:23 PM ? ? Modules accepted: Orders ? ?

## 2021-05-28 NOTE — Telephone Encounter (Signed)
Requested Prescriptions  ?Pending Prescriptions Disp Refills  ?? metoprolol succinate (TOPROL-XL) 50 MG 24 hr tablet 90 tablet 0  ?  Sig: Take 1 tablet (50 mg total) by mouth daily. Take with or immediately following a meal.  ?  ? Cardiovascular:  Beta Blockers Passed - 05/28/2021  2:23 PM  ?  ?  Passed - Last BP in normal range  ?  BP Readings from Last 1 Encounters:  ?03/19/21 125/77  ?   ?  ?  Passed - Last Heart Rate in normal range  ?  Pulse Readings from Last 1 Encounters:  ?03/19/21 82  ?   ?  ?  Passed - Valid encounter within last 6 months  ?  Recent Outpatient Visits   ?      ? 2 months ago Primary hypertension  ? [facility] Bacigalupo, Dionne Bucy, MD  ? 8 months ago Encounter for annual wellness visit (AWV) in Medicare patient  ? [facility] [place], Dionne Bucy, MD  ? 1 year ago External hemorrhoids  ? [facility] [place], Dionne Bucy, MD  ? 1 year ago Primary hypertension  ? [facility] [place], Dionne Bucy, MD  ? 1 year ago Sore throat  ? [facility] [place], [place], [place]  ?  ?  ?Future Appointments   ?        ? In 3 months Bacigalupo, Dionne Bucy, MD [facility]  ? In 7 months Ralene Bathe, MD [place]  ?  ? ?  ?  ?  ? ?

## 2021-05-28 NOTE — Telephone Encounter (Signed)
Pt is calling to check on the status of the medication refill. Pt states that she has about 7 days left. Pt has CPE in 8/23 ?

## 2021-07-31 ENCOUNTER — Other Ambulatory Visit: Payer: Self-pay | Admitting: Family Medicine

## 2021-07-31 DIAGNOSIS — Z1231 Encounter for screening mammogram for malignant neoplasm of breast: Secondary | ICD-10-CM

## 2021-08-20 ENCOUNTER — Ambulatory Visit: Payer: Medicare Other | Admitting: Dermatology

## 2021-08-20 ENCOUNTER — Encounter: Payer: Self-pay | Admitting: Dermatology

## 2021-08-20 DIAGNOSIS — L570 Actinic keratosis: Secondary | ICD-10-CM | POA: Diagnosis not present

## 2021-08-20 DIAGNOSIS — D489 Neoplasm of uncertain behavior, unspecified: Secondary | ICD-10-CM

## 2021-08-20 DIAGNOSIS — L738 Other specified follicular disorders: Secondary | ICD-10-CM

## 2021-08-20 NOTE — Patient Instructions (Addendum)
Wound Care Instructions  Cleanse wound gently with soap and water once a day then pat dry with clean gauze. Apply a thing coat of Petrolatum (petroleum jelly, "Vaseline") over the wound (unless you have an allergy to this). We recommend that you use a new, sterile tube of Vaseline. Do not pick or remove scabs. Do not remove the yellow or white "healing tissue" from the base of the wound.  Cover the wound with fresh, clean, nonstick gauze and secure with paper tape. You may use Band-Aids in place of gauze and tape if the would is small enough, but would recommend trimming much of the tape off as there is often too much. Sometimes Band-Aids can irritate the skin.  You should call the office for your biopsy report after 1 week if you have not already been contacted.  If you experience any problems, such as abnormal amounts of bleeding, swelling, significant bruising, significant pain, or evidence of infection, please call the office immediately.  FOR ADULT SURGERY PATIENTS: If you need something for pain relief you may take 1 extra strength Tylenol (acetaminophen) AND 2 Ibuprofen (200mg each) together every 4 hours as needed for pain. (do not take these if you are allergic to them or if you have a reason you should not take them.) Typically, you may only need pain medication for 1 to 3 days.    Due to recent changes in healthcare laws, you may see results of your pathology and/or laboratory studies on MyChart before the doctors have had a chance to review them. We understand that in some cases there may be results that are confusing or concerning to you. Please understand that not all results are received at the same time and often the doctors may need to interpret multiple results in order to provide you with the best plan of care or course of treatment. Therefore, we ask that you please give us 2 business days to thoroughly review all your results before contacting the office for clarification. Should we  see a critical lab result, you will be contacted sooner.   If You Need Anything After Your Visit  If you have any questions or concerns for your doctor, please call our main line at [phone] and press option 4 to reach your doctor's medical assistant. If no one answers, please leave a voicemail as directed and we will return your call as soon as possible. Messages left after 4 pm will be answered the following business day.   You may also send us a message via MyChart. We typically respond to MyChart messages within 1-2 business days.  For prescription refills, please ask your pharmacy to contact our office. Our fax number is [phone].  If you have an urgent issue when the clinic is closed that cannot wait until the next business day, you can page your doctor at the number below.    Please note that while we do our best to be available for urgent issues outside of office hours, we are not available 24/7.   If you have an urgent issue and are unable to reach us, you may choose to seek medical care at your doctor's office, retail clinic, urgent care center, or emergency room.  If you have a medical emergency, please immediately call 911 or go to the emergency department.  Pager Numbers  - Dr. Kowalski: [phone]  - Dr. Moye: [phone]  - Dr. Stewart: [phone]  In the event of inclement weather, please call our main line at [phone]   for an update on the status of any delays or closures.  Dermatology Medication Tips: Please keep the boxes that topical medications come in in order to help keep track of the instructions about where and how to use these. Pharmacies typically print the medication instructions only on the boxes and not directly on the medication tubes.   If your medication is too expensive, please contact our office at [phone] option 4 or send us a message through MyChart.   We are unable to tell what your co-pay for medications will be in advance  as this is different depending on your insurance coverage. However, we may be able to find a substitute medication at lower cost or fill out paperwork to get insurance to cover a needed medication.   If a prior authorization is required to get your medication covered by your insurance company, please allow us 1-2 business days to complete this process.  Drug prices often vary depending on where the prescription is filled and some pharmacies may offer cheaper prices.  The website www.goodrx.com contains coupons for medications through different pharmacies. The prices here do not account for what the cost may be with help from insurance (it may be cheaper with your insurance), but the website can give you the price if you did not use any insurance.  - You can print the associated coupon and take it with your prescription to the pharmacy.  - You may also stop by our office during regular business hours and pick up a GoodRx coupon card.  - If you need your prescription sent electronically to a different pharmacy, notify our office through [place] MyChart or by phone at [phone] option 4.     Si Usted Necesita Algo Despus de Su Visita  Tambin puede enviarnos un mensaje a travs de MyChart. Por lo general respondemos a los mensajes de MyChart en el transcurso de 1 a 2 das hbiles.  Para renovar recetas, por favor pida a su farmacia que se ponga en contacto con nuestra oficina. Nuestro nmero de fax es el [phone].  Si tiene un asunto urgente cuando la clnica est cerrada y que no puede esperar hasta el siguiente da hbil, puede llamar/localizar a su doctor(a) al nmero que aparece a continuacin.   Por favor, tenga en cuenta que aunque hacemos todo lo posible para estar disponibles para asuntos urgentes fuera del horario de oficina, no estamos disponibles las 24 horas del da, los 7 das de la semana.   Si tiene un problema urgente y no puede comunicarse con nosotros, puede optar  por buscar atencin mdica  en el consultorio de su doctor(a), en una clnica privada, en un centro de atencin urgente o en una sala de emergencias.  Si tiene una emergencia mdica, por favor llame inmediatamente al 911 o vaya a la sala de emergencias.  Nmeros de bper  - Dr. Kowalski: [phone]  - Dra. Moye: [phone]  - Dra. Stewart: [phone]  En caso de inclemencias del tiempo, por favor llame a nuestra lnea principal al [phone] para una actualizacin sobre el estado de cualquier retraso o cierre.  Consejos para la medicacin en dermatologa: Por favor, guarde las cajas en las que vienen los medicamentos de uso tpico para ayudarle a seguir las instrucciones sobre dnde y cmo usarlos. Las farmacias generalmente imprimen las instrucciones del medicamento slo en las cajas y no directamente en los tubos del medicamento.   Si su medicamento es muy caro, por favor, pngase en contacto con nuestra   oficina llamando al [phone] y presione la opcin 4 o envenos un mensaje a travs de MyChart.   No podemos decirle cul ser su copago por los medicamentos por adelantado ya que esto es diferente dependiendo de la cobertura de su seguro. Sin embargo, es posible que podamos encontrar un medicamento sustituto a menor costo o llenar un formulario para que el seguro cubra el medicamento que se considera necesario.   Si se requiere una autorizacin previa para que su compaa de seguros cubra su medicamento, por favor permtanos de 1 a 2 das hbiles para completar este proceso.  Los precios de los medicamentos varan con frecuencia dependiendo del lugar de dnde se surte la receta y alguna farmacias pueden ofrecer precios ms baratos.  El sitio web www.goodrx.com tiene cupones para medicamentos de diferentes farmacias. Los precios aqu no tienen en cuenta lo que podra costar con la ayuda del seguro (puede ser ms barato con su seguro), pero el sitio web puede darle el precio si  no utiliz ningn seguro.  - Puede imprimir el cupn correspondiente y llevarlo con su receta a la farmacia.  - Tambin puede pasar por nuestra oficina durante el horario de atencin regular y recoger una tarjeta de cupones de GoodRx.  - Si necesita que su receta se enve electrnicamente a una farmacia diferente, informe a nuestra oficina a travs de MyChart de [place] o por telfono llamando al [phone] y presione la opcin 4.  

## 2021-08-20 NOTE — Progress Notes (Signed)
   Follow-Up Visit   Subjective  Tracie Hall is a 66 y.o. female who presents for the following: lesion (Spot on right upper forehead at hair line. Noticed ~2 weeks ago. Has not changed since noticed. Pink. Non tender, denies itch).  The patient has spots, moles and lesions to be evaluated, some may be new or changing and the patient has concerns that these could be cancer.   The following portions of the chart were reviewed this encounter and updated as appropriate:  Tobacco  Allergies  Meds  Problems  Med Hx  Surg Hx  Fam Hx      Review of Systems: No other skin or systemic complaints except as noted in HPI or Assessment and Plan.   Objective  Well appearing patient in no apparent distress; mood and affect are within normal limits.  A focused examination was performed including face. Relevant physical exam findings are noted in the Assessment and Plan.  right upper forehead near hair line 0.3 cm pink cystic papule      Assessment & Plan  Neoplasm of uncertain behavior right upper forehead near hair line  Skin / nail biopsy Type of biopsy: punch   Informed consent: discussed and consent obtained   Timeout: patient name, date of birth, surgical site, and procedure verified   Procedure prep:  Patient was prepped and draped in usual sterile fashion Prep type:  Isopropyl alcohol Anesthesia: the lesion was anesthetized in a standard fashion   Anesthetic:  1% lidocaine w/ epinephrine 1-100,000 buffered w/ 8.4% NaHCO3 Punch size:  3 mm Suture size:  5-0 Suture type: Prolene (polypropylene)   Hemostasis achieved with: suture   Outcome: patient tolerated procedure well   Post-procedure details: sterile dressing applied and wound care instructions given   Dressing type: bacitracin    Specimen 1 - Surgical pathology Differential Diagnosis: Ruptured cyst vs BCC  Check Margins: No  Small amount of cyst contents expressed.    Return in about 1 week (around  08/27/2021) for Suture Removal, Wednesday AM.  I, Emelia Salisbury, CMA, am acting as scribe for Forest Gleason, MD.  Documentation: I have reviewed the above documentation for accuracy and completeness, and I agree with the above.  Forest Gleason, MD

## 2021-08-24 ENCOUNTER — Other Ambulatory Visit: Payer: Self-pay | Admitting: Family Medicine

## 2021-08-24 ENCOUNTER — Encounter: Payer: Self-pay | Admitting: Dermatology

## 2021-08-24 DIAGNOSIS — I10 Essential (primary) hypertension: Secondary | ICD-10-CM

## 2021-08-25 NOTE — Telephone Encounter (Signed)
Requested Prescriptions  Pending Prescriptions Disp Refills  . metoprolol succinate (TOPROL-XL) 50 MG 24 hr tablet [Pharmacy Med Name: METOPROLOL ER SUCCINATE '50MG'$  TABS] 90 tablet 0    Sig: TAKE 1 TABLET(50 MG) BY MOUTH DAILY WITH OR IMMEDIATELY FOLLOWING A MEAL     Cardiovascular:  Beta Blockers Passed - 08/24/2021  3:29 AM      Passed - Last BP in normal range    BP Readings from Last 1 Encounters:  03/19/21 125/77         Passed - Last Heart Rate in normal range    Pulse Readings from Last 1 Encounters:  03/19/21 82         Passed - Valid encounter within last 6 months    Recent Outpatient Visits          5 months ago Primary hypertension   [facility] [place], Dionne Bucy, MD   11 months ago Encounter for annual wellness visit (AWV) in Medicare patient   [facility] [facility], Dionne Bucy, MD   1 year ago External hemorrhoids   [facility] [place], Dionne Bucy, MD   1 year ago Primary hypertension   [facility] [place], Dionne Bucy, MD   1 year ago Sore throat   [facility] [place] Post, [place]      Future Appointments            Tomorrow Alfonso Patten, [place]   In 3 weeks Bacigalupo, Dionne Bucy, MD [facility] [place]   In 4 months Ralene Bathe, MD [place]

## 2021-08-26 ENCOUNTER — Ambulatory Visit (INDEPENDENT_AMBULATORY_CARE_PROVIDER_SITE_OTHER): Payer: Medicare Other | Admitting: Dermatology

## 2021-08-26 DIAGNOSIS — L570 Actinic keratosis: Secondary | ICD-10-CM | POA: Diagnosis not present

## 2021-08-26 DIAGNOSIS — Z4802 Encounter for removal of sutures: Secondary | ICD-10-CM

## 2021-08-26 NOTE — Patient Instructions (Signed)
Cryotherapy Aftercare  Wash gently with soap and water everyday.   Apply Vaseline and Band-Aid daily until healed.     Due to recent changes in healthcare laws, you may see results of your pathology and/or laboratory studies on MyChart before the doctors have had a chance to review them. We understand that in some cases there may be results that are confusing or concerning to you. Please understand that not all results are received at the same time and often the doctors may need to interpret multiple results in order to provide you with the best plan of care or course of treatment. Therefore, we ask that you please give us 2 business days to thoroughly review all your results before contacting the office for clarification. Should we see a critical lab result, you will be contacted sooner.   If You Need Anything After Your Visit  If you have any questions or concerns for your doctor, please call our main line at [phone] and press option 4 to reach your doctor's medical assistant. If no one answers, please leave a voicemail as directed and we will return your call as soon as possible. Messages left after 4 pm will be answered the following business day.   You may also send us a message via MyChart. We typically respond to MyChart messages within 1-2 business days.  For prescription refills, please ask your pharmacy to contact our office. Our fax number is [phone].  If you have an urgent issue when the clinic is closed that cannot wait until the next business day, you can page your doctor at the number below.    Please note that while we do our best to be available for urgent issues outside of office hours, we are not available 24/7.   If you have an urgent issue and are unable to reach us, you may choose to seek medical care at your doctor's office, retail clinic, urgent care center, or emergency room.  If you have a medical emergency, please immediately call 911 or go to the  emergency department.  Pager Numbers  - Dr. Kowalski: [phone]  - Dr. Moye: [phone]  - Dr. Stewart: [phone]  In the event of inclement weather, please call our main line at [phone] for an update on the status of any delays or closures.  Dermatology Medication Tips: Please keep the boxes that topical medications come in in order to help keep track of the instructions about where and how to use these. Pharmacies typically print the medication instructions only on the boxes and not directly on the medication tubes.   If your medication is too expensive, please contact our office at [phone] option 4 or send us a message through MyChart.   We are unable to tell what your co-pay for medications will be in advance as this is different depending on your insurance coverage. However, we may be able to find a substitute medication at lower cost or fill out paperwork to get insurance to cover a needed medication.   If a prior authorization is required to get your medication covered by your insurance company, please allow us 1-2 business days to complete this process.  Drug prices often vary depending on where the prescription is filled and some pharmacies may offer cheaper prices.  The website www.goodrx.com contains coupons for medications through different pharmacies. The prices here do not account for what the cost may be with help from insurance (it may be cheaper with your insurance), but the website can   give you the price if you did not use any insurance.  - You can print the associated coupon and take it with your prescription to the pharmacy.  - You may also stop by our office during regular business hours and pick up a GoodRx coupon card.  - If you need your prescription sent electronically to a different pharmacy, notify our office through [place] MyChart or by phone at [phone] option 4.     Si Usted Necesita Algo Despus de Su Visita  Tambin puede  enviarnos un mensaje a travs de MyChart. Por lo general respondemos a los mensajes de MyChart en el transcurso de 1 a 2 das hbiles.  Para renovar recetas, por favor pida a su farmacia que se ponga en contacto con nuestra oficina. Nuestro nmero de fax es el [phone].  Si tiene un asunto urgente cuando la clnica est cerrada y que no puede esperar hasta el siguiente da hbil, puede llamar/localizar a su doctor(a) al nmero que aparece a continuacin.   Por favor, tenga en cuenta que aunque hacemos todo lo posible para estar disponibles para asuntos urgentes fuera del horario de oficina, no estamos disponibles las 24 horas del da, los 7 das de la semana.   Si tiene un problema urgente y no puede comunicarse con nosotros, puede optar por buscar atencin mdica  en el consultorio de su doctor(a), en una clnica privada, en un centro de atencin urgente o en una sala de emergencias.  Si tiene una emergencia mdica, por favor llame inmediatamente al 911 o vaya a la sala de emergencias.  Nmeros de bper  - Dr. Kowalski: [phone]  - Dra. Moye: [phone]  - Dra. Stewart: [phone]  En caso de inclemencias del tiempo, por favor llame a nuestra lnea principal al [phone] para una actualizacin sobre el estado de cualquier retraso o cierre.  Consejos para la medicacin en dermatologa: Por favor, guarde las cajas en las que vienen los medicamentos de uso tpico para ayudarle a seguir las instrucciones sobre dnde y cmo usarlos. Las farmacias generalmente imprimen las instrucciones del medicamento slo en las cajas y no directamente en los tubos del medicamento.   Si su medicamento es muy caro, por favor, pngase en contacto con nuestra oficina llamando al [phone] y presione la opcin 4 o envenos un mensaje a travs de MyChart.   No podemos decirle cul ser su copago por los medicamentos por adelantado ya que esto es diferente dependiendo de la cobertura de su seguro.  Sin embargo, es posible que podamos encontrar un medicamento sustituto a menor costo o llenar un formulario para que el seguro cubra el medicamento que se considera necesario.   Si se requiere una autorizacin previa para que su compaa de seguros cubra su medicamento, por favor permtanos de 1 a 2 das hbiles para completar este proceso.  Los precios de los medicamentos varan con frecuencia dependiendo del lugar de dnde se surte la receta y alguna farmacias pueden ofrecer precios ms baratos.  El sitio web www.goodrx.com tiene cupones para medicamentos de diferentes farmacias. Los precios aqu no tienen en cuenta lo que podra costar con la ayuda del seguro (puede ser ms barato con su seguro), pero el sitio web puede darle el precio si no utiliz ningn seguro.  - Puede imprimir el cupn correspondiente y llevarlo con su receta a la farmacia.  - Tambin puede pasar por nuestra oficina durante el horario de atencin regular y recoger una tarjeta de cupones de GoodRx.  -   Si necesita que su receta se enve electrnicamente a una farmacia diferente, informe a nuestra oficina a travs de MyChart de [place] o por telfono llamando al [phone] y presione la opcin 4.  

## 2021-08-26 NOTE — Progress Notes (Unsigned)
   Follow-Up Visit   Subjective  Tracie Hall is a 66 y.o. female who presents for the following: Follow-up (Patient here today for suture removal. ).   The following portions of the chart were reviewed this encounter and updated as appropriate:   Tobacco  Allergies  Meds  Problems  Med Hx  Surg Hx  Fam Hx      Review of Systems:  No other skin or systemic complaints except as noted in HPI or Assessment and Plan.  Objective  Well appearing patient in no apparent distress; mood and affect are within normal limits.  A focused examination was performed including face. Relevant physical exam findings are noted in the Assessment and Plan.  right upper forehead near hair line Erythematous thin papules/macules with gritty scale.     Assessment & Plan  AK (actinic keratosis) right upper forehead near hair line  Bx proven  Actinic keratoses are precancerous spots that appear secondary to cumulative UV radiation exposure/sun exposure over time. They are chronic with expected duration over 1 year. A portion of actinic keratoses will progress to squamous cell carcinoma of the skin. It is not possible to reliably predict which spots will progress to skin cancer and so treatment is recommended to prevent development of skin cancer.  Recommend daily broad spectrum sunscreen SPF 30+ to sun-exposed areas, reapply every 2 hours as needed.  Recommend staying in the shade or wearing long sleeves, sun glasses (UVA+UVB protection) and wide brim hats (4-inch brim around the entire circumference of the hat). Call for new or changing lesions.   Destruction of lesion - right upper forehead near hair line  Destruction method: cryotherapy   Informed consent: discussed and consent obtained   Lesion destroyed using liquid nitrogen: Yes   Cryotherapy cycles:  2 Outcome: patient tolerated procedure well with no complications   Post-procedure details: wound care instructions given   Additional  details:  Prior to procedure, discussed risks of blister formation, small wound, skin dyspigmentation, or rare scar following cryotherapy. Recommend Vaseline ointment to treated areas while healing.    Encounter for Removal of Sutures - Incision site at the right upper forehead near hair line is clean, dry and intact - Wound cleansed, sutures removed, wound cleansed and steri strips applied.  - Discussed pathology results showing [place]  - Patient advised to keep steri-strips dry until they fall off. - Scars remodel for a full year. - Once steri-strips fall off, patient can apply over-the-counter silicone scar cream each night to help with scar remodeling if desired. - Patient advised to call with any concerns or if they notice any new or changing lesions.  Return for as scheduled.  Graciella Belton, RMA, am acting as scribe for Forest Gleason, MD .  Documentation: I have reviewed the above documentation for accuracy and completeness, and I agree with the above.  Forest Gleason, MD

## 2021-08-27 ENCOUNTER — Encounter: Payer: Self-pay | Admitting: Dermatology

## 2021-09-21 ENCOUNTER — Ambulatory Visit (INDEPENDENT_AMBULATORY_CARE_PROVIDER_SITE_OTHER): Payer: Medicare Other | Admitting: Family Medicine

## 2021-09-21 ENCOUNTER — Encounter: Payer: Self-pay | Admitting: Family Medicine

## 2021-09-21 ENCOUNTER — Encounter: Payer: Medicare Other | Admitting: Family Medicine

## 2021-09-21 VITALS — BP 151/83 | HR 97 | Temp 98.5°F | Resp 16 | Ht 65.0 in | Wt 133.8 lb

## 2021-09-21 DIAGNOSIS — I10 Essential (primary) hypertension: Secondary | ICD-10-CM | POA: Diagnosis not present

## 2021-09-21 DIAGNOSIS — R7303 Prediabetes: Secondary | ICD-10-CM

## 2021-09-21 DIAGNOSIS — E538 Deficiency of other specified B group vitamins: Secondary | ICD-10-CM | POA: Diagnosis not present

## 2021-09-21 DIAGNOSIS — Z0000 Encounter for general adult medical examination without abnormal findings: Secondary | ICD-10-CM | POA: Diagnosis not present

## 2021-09-21 DIAGNOSIS — E785 Hyperlipidemia, unspecified: Secondary | ICD-10-CM

## 2021-09-21 DIAGNOSIS — G250 Essential tremor: Secondary | ICD-10-CM | POA: Diagnosis not present
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
## 2021-09-21 NOTE — Assessment & Plan Note (Signed)
Elevated today but with normal at home measurements. Likely element of white coat HTN. Continue current regmien. Continue to monitor at home, RTC for any leevated at home measurements. F/u 6 mths.

## 2021-09-21 NOTE — Assessment & Plan Note (Signed)
Improved with metoprolol.

## 2021-09-21 NOTE — Patient Instructions (Signed)
?  Lie on your back. Bend your right knee so that your right foot is flat on the floor. ?Cross your left leg over your right so that your left ankle rests on your right knee. ?Use your hands to grab hold of your left knee and pull it gently toward the opposite shoulder. You should feel the stretch in your buttocks and hips. ?Hold for 15 to 30 seconds. ?Relax, and then repeat with the other leg. ?Repeat this cycle 2 to 4 times. ? ? ?Lie on your back in a doorway, with one leg through the open door. ?Slide your leg up the wall to straighten your knee. You should feel a gentle stretch down the back of your leg. Hold the stretch for at least 1 minute. As the days go by, add a little more time until you can relax and let these muscles stretch for as much as 6 minutes for each leg. ?Do not arch your back. ?Do not bend either knee. ?Keep one heel touching the floor and the other heel touching the wall. Do not point your toes. ?Repeat with your other leg. ?Do 2 to 4 times for each leg. ?If you do not have a place to do this exercise in a doorway, there is another way to do it: ? ?Lie on your back and bend the knee of the leg you want to stretch. ?Loop a towel under the ball and toes of that foot, and hold the ends of the towel in your hands. ?Straighten your knee and slowly pull back on the towel. You should feel a gentle stretch down the back of your leg. It is hard to hold this stretch with a towel for a long time, but hold the stretch for at least 15 to 30 seconds. One minute or more is even better. ?Repeat with your other leg. ?Do 2 to 4 times for each leg. ? ? ?Child's Pose ?Kneel on the floor with your toes together and your knees hip-width apart. Rest your palms on top of your thighs. ?On an exhale, lower your torso between your knees. Extend your arms alongside your torso with your palms facing down. Relax your shoulders toward the ground. Rest in the pose for as long as needed. ? ?

## 2021-09-22 LAB — CBC WITH DIFFERENTIAL/PLATELET
Basophils Absolute: 0 10*3/uL (ref 0.0–0.2)
Basos: 1 %
EOS (ABSOLUTE): 0.1 10*3/uL (ref 0.0–0.4)
Eos: 1 %
Hematocrit: 39.6 % (ref 34.0–46.6)
Hemoglobin: 12.7 g/dL (ref 11.1–15.9)
Immature Grans (Abs): 0 10*3/uL (ref 0.0–0.1)
Immature Granulocytes: 0 %
Lymphocytes Absolute: 1 10*3/uL (ref 0.7–3.1)
Lymphs: 12 %
MCH: 28.5 pg (ref 26.6–33.0)
MCHC: 32.1 g/dL (ref 31.5–35.7)
MCV: 89 fL (ref 79–97)
Monocytes Absolute: 0.7 10*3/uL (ref 0.1–0.9)
Monocytes: 9 %
Neutrophils Absolute: 6.7 10*3/uL (ref 1.4–7.0)
Neutrophils: 77 %
Platelets: 271 10*3/uL (ref 150–450)
RBC: 4.45 x10E6/uL (ref 3.77–5.28)
RDW: 11.8 % (ref 11.7–15.4)
WBC: 8.5 10*3/uL (ref 3.4–10.8)

## 2021-09-22 LAB — LIPID PANEL
Chol/HDL Ratio: 2.1 ratio (ref 0.0–4.4)
Cholesterol, Total: 170 mg/dL (ref 100–199)
HDL: 80 mg/dL (ref >39)
LDL Chol Calc (NIH): 77 mg/dL (ref 0–99)
Triglycerides: 70 mg/dL (ref 0–149)
VLDL Cholesterol Cal: 13 mg/dL (ref 5–40)

## 2021-09-22 LAB — COMPREHENSIVE METABOLIC PANEL
ALT: 13 IU/L (ref 0–32)
AST: 18 IU/L (ref 0–40)
Albumin/Globulin Ratio: 1.7 (ref 1.2–2.2)
Albumin: 4.3 g/dL (ref 3.9–4.9)
Alkaline Phosphatase: 80 IU/L (ref 44–121)
BUN/Creatinine Ratio: 16 (ref 12–28)
BUN: 12 mg/dL (ref 8–27)
Bilirubin Total: 0.8 mg/dL (ref 0.0–1.2)
CO2: 23 mmol/L (ref 20–29)
Calcium: 9.7 mg/dL (ref 8.7–10.3)
Chloride: 100 mmol/L (ref 96–106)
Creatinine, Ser: 0.75 mg/dL (ref 0.57–1.00)
Globulin, Total: 2.5 g/dL (ref 1.5–4.5)
Glucose: 120 mg/dL — ABNORMAL HIGH (ref 70–99)
Potassium: 3.8 mmol/L (ref 3.5–5.2)
Sodium: 139 mmol/L (ref 134–144)
Total Protein: 6.8 g/dL (ref 6.0–8.5)
eGFR: 87 mL/min/{1.73_m2} (ref >59)

## 2021-09-22 LAB — HEPATITIS C ANTIBODY: Hep C Virus Ab: NONREACTIVE

## 2021-09-22 LAB — HEMOGLOBIN A1C
Est. average glucose Bld gHb Est-mCnc: 117 mg/dL
Hgb A1c MFr Bld: 5.7 % — ABNORMAL HIGH (ref 4.8–5.6)

## 2021-09-22 LAB — VITAMIN B12: Vitamin B-12: 609 pg/mL (ref 232–1245)

## 2021-09-28 ENCOUNTER — Ambulatory Visit
Admission: RE | Admit: 2021-09-28 | Discharge: 2021-09-28 | Disposition: A | Discharge status: Home / Self Care | Payer: Medicare Other | Source: Ambulatory Visit | Attending: Emergency Medicine | Admitting: Emergency Medicine

## 2021-09-28 VITALS — BP 167/85 | HR 97 | Temp 98.2°F | Resp 18 | Ht 65.0 in | Wt 133.8 lb

## 2021-09-28 DIAGNOSIS — W57XXXA Bitten or stung by nonvenomous insect and other nonvenomous arthropods, initial encounter: Secondary | ICD-10-CM | POA: Diagnosis not present

## 2021-09-28 DIAGNOSIS — S80859A Superficial foreign body, unspecified lower leg, initial encounter: Secondary | ICD-10-CM

## 2021-09-28 DIAGNOSIS — I10 Essential (primary) hypertension: Secondary | ICD-10-CM

## 2021-09-28 DIAGNOSIS — S80861A Insect bite (nonvenomous), right lower leg, initial encounter: Secondary | ICD-10-CM

## 2021-09-28 NOTE — Discharge Instructions (Addendum)
See the attached information on tick bites.  Follow up with your primary care provider if you have any concerning symptoms.    Your blood pressure is elevated today at 180/89; repeat 172/91.  Please have this rechecked by your primary care provider in 1-2 weeks.
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
## 2021-09-28 NOTE — ED Triage Notes (Signed)
Patient to Urgent Care with complaints of a possible retained tick head present to right calf.  Reports removing tick but there is a black dot remained. Tick possibly present for 1 day, reports that tick was very small. Thinks she possibly got it after her dog brushed up against her.

## 2021-09-29 ENCOUNTER — Telehealth: Payer: Self-pay | Admitting: Cardiovascular Disease

## 2021-09-29 ENCOUNTER — Encounter: Payer: Self-pay | Admitting: Cardiovascular Disease

## 2021-09-29 NOTE — Telephone Encounter (Signed)
Spoke with patient and she was calling in regarding to her elevated blood pressures. She states that she was sending message with her numbers so that Dr. Rockey Situ can review. Encouraged her to send those to [place] so he can take a look at them. Reviewed recommendations to check her blood pressures 2 hours after medication. She did state that some of those readings were before and then reviewed some tips when checking blood pressures. Reviewed ED precautions as well. She verbalized understanding of our conversation, agreement with plan, and had no further questions at this time.

## 2021-09-29 NOTE — Telephone Encounter (Signed)
Pt c/o BP issue: STAT if pt c/o blurred vision, one-sided weakness or slurred speech  1. What are your last 5 BP readings? 180/101; 159/91; 156/85  2. Are you having any other symptoms (ex. Dizziness, headache, blurred vision, passed out)? Headache   3. What is your BP issue? Patient is experiencing HP

## 2021-09-30 MED ORDER — LOSARTAN POTASSIUM 50 MG PO TABS: 50.0000 mg | ORAL_TABLET | Freq: Every day | ORAL | 3 refills | Status: DC

## 2021-09-30 NOTE — Telephone Encounter (Signed)
Reviewed provider recommendations with patient. Instructed her to continue blood pressure log and bring in to her upcoming appointment. She was agreeable with plan with no further questions at this time.

## 2021-09-30 NOTE — Addendum Note (Signed)
Addended by: Valora Corporal on: 09/30/2021 09:47 AM   Modules accepted: Orders

## 2021-09-30 NOTE — Telephone Encounter (Signed)
Left voicemail message to call back for review of recommendations.    Minna Merritts, MD  [facility] Triage 9 minutes ago (9:15 AM)    Given high blood pressure would start losartan 50 mg daily  Additional medication titration may be needed when she is seen in clinic in 3 weeks time  Thx  TG

## 2021-10-12 ENCOUNTER — Ambulatory Visit (INDEPENDENT_AMBULATORY_CARE_PROVIDER_SITE_OTHER): Payer: Medicare Other | Admitting: Physician Assistant

## 2021-10-12 VITALS — BP 163/84 | HR 71 | Temp 98.0°F | Wt 135.0 lb

## 2021-10-12 DIAGNOSIS — S80862D Insect bite (nonvenomous), left lower leg, subsequent encounter: Secondary | ICD-10-CM | POA: Diagnosis not present

## 2021-10-12 DIAGNOSIS — I10 Essential (primary) hypertension: Secondary | ICD-10-CM | POA: Diagnosis not present

## 2021-10-12 DIAGNOSIS — R5383 Other fatigue: Secondary | ICD-10-CM

## 2021-10-12 DIAGNOSIS — W57XXXD Bitten or stung by nonvenomous insect and other nonvenomous arthropods, subsequent encounter: Secondary | ICD-10-CM

## 2021-10-12 NOTE — Progress Notes (Signed)
Argentina Ponder [place],acting as a Education administrator for [employer], PA-C.,have documented all relevant documentation on the behalf of Mardene Speak, PA-C,as directed by  [employer], PA-C while in the presence of [employer], PA-C.     Established patient visit   Patient: Tracie Hall   DOB: 10-12-1955   65 y.o. Female  MRN: [id number] Visit Date: 10/12/2021  Today's healthcare provider: Mardene Speak, PA-C   CC: Tick bite  Subjective    HPI  Patient is a 66 year old female who presents with concerns of a tick bite.  She was seen in the UC on 09/28/21 after removing a tick from her leg.  She believed at the time that the head of the tick remained.  Per the UR note, she did have a black foreign body at the site of the tick bite.  There was no erythema or drainage from the wound at that time and the object was removed.  Patient was advised during her UC visit that her blood pressure was elevated and she was instructed to follow up with PCP in 1-2 weeks to check on it. There was no change in  treatment for the blood pressure at the time of her ER visit.  SBP ranges between 120-105 on '50mg'$  and 120-130 on '25mg'$   Patient has been started on Losartan 25 mg by Dr Rockey Situ and has f/u this 10/14/21.   Medications: Outpatient Medications Prior to Visit  Medication Sig Note   cetirizine (ZYRTEC) 10 MG tablet Take 1 tablet (10 mg total) by mouth daily. (Patient taking differently: Take 10 mg by mouth as needed.)    conjugated estrogens (PREMARIN) vaginal cream PLACE 2.70 APPLICATORFULS VAGINALLY 2 (TWO) TIMES A WEEK. (Patient taking differently: as needed. PLACE 3.50 APPLICATORFULS VAGINALLY 2 (TWO) TIMES A WEEK.)    cyanocobalamin 1000 MCG tablet Take by mouth.    losartan (COZAAR) 50 MG tablet Take 1 tablet (50 mg total) by mouth daily. 10/12/2021: Patient taking 25 mg daily    metoprolol succinate (TOPROL-XL) 50 MG 24 hr tablet TAKE 1 TABLET(50 MG) BY MOUTH DAILY WITH OR IMMEDIATELY FOLLOWING A MEAL     CALCIUM-VITAMIN D PO Take 600 mg by mouth daily.    No facility-administered medications prior to visit.    Review of Systems  Constitutional:  Positive for fatigue. Negative for fever.  Eyes:  Negative for visual disturbance.  Gastrointestinal:  Negative for abdominal distention, abdominal pain, nausea and rectal pain.  Musculoskeletal:  Negative for arthralgias.  Skin:  Negative for rash.  Neurological:  Negative for dizziness and headaches.       Objective    BP (!) 163/84   Pulse 71   Temp 98 F (36.7 C) (Oral)   Wt 135 lb (61.2 kg)   SpO2 100%   BMI 22.47 kg/m  Vitals:   10/12/21 1359 10/12/21 1405  BP: (!) 173/85 (!) 163/84  Pulse: 71   Temp: 98 F (36.7 C)   TempSrc: Oral   SpO2: 100%   Weight: 135 lb (61.2 kg)      Physical Exam Vitals reviewed.  Constitutional:      General: She is not in acute distress.    Appearance: Normal appearance. She is well-developed. She is not diaphoretic.  HENT:     Head: Normocephalic and atraumatic.  Eyes:     General: No scleral icterus.    Conjunctiva/sclera: Conjunctivae normal.  Neck:     Thyroid: No thyromegaly.  Cardiovascular:  Rate and Rhythm: Normal rate and regular rhythm.     Pulses: Normal pulses.     Heart sounds: Normal heart sounds. No murmur heard. Pulmonary:     Effort: Pulmonary effort is normal. No respiratory distress.     Breath sounds: Normal breath sounds. No wheezing, rhonchi or rales.  Musculoskeletal:     Cervical back: Neck supple.     Right lower leg: No edema.     Left lower leg: No edema.  Lymphadenopathy:     Cervical: No cervical adenopathy.  Skin:    General: Skin is warm and dry.     Findings: Lesion (no erythema, no swelling, healed lesion/ 1-2 mm on the posterior aspect of the left calf) present. No rash.  Neurological:     General: No focal deficit present.     Mental Status: She is alert and oriented to person, place, and time. Mental status is at baseline.   Psychiatric:        Mood and Affect: Mood normal.        Behavior: Behavior normal.        Thought Content: Thought content normal.        Judgment: Judgment normal.       No results found for any visits on 10/12/21.  Assessment & Plan     1. Tick bite of calf, left, subsequent encounter Resolved. Asymptomatic Tick was removed within 72 hours after the bite No rash developed after the bite, no fever or chills, numbness, weakness or other symptoms. Head of the tick was removed at ED  PE today was reassuring. For pt's reassurance,  we could check her serology in a mo  2. Fatigue, unspecified type 3. HTN BP today was 163/84 second reading and SBP was 157 third reading Fatigue Could be attributed to HTN Continue losartan 25 mg and advised to manage per cardiology discretion. Low salt diet recommended Relaxation techniques advised.   FU PRN     The patient was advised to call back or seek an in-person evaluation if the symptoms worsen or if the condition fails to improve as anticipated.  I discussed the assessment and treatment plan with the patient. The patient was provided an opportunity to ask questions and all were answered. The patient agreed with the plan and demonstrated an understanding of the instructions.  The entirety of the information documented in the History of Present Illness, Review of Systems and Physical Exam were personally obtained by me. Portions of this information were initially documented by the CMA and reviewed by me for thoroughness and accuracy.  Portions of this note were created using dictation software and may contain typographical errors.        Total encounter time more than 30 minutes  Greater than 50% was spent in counseling and coordination of care with the patient   Elberta Leatherwood  [facility] [phone] (phone) [phone] (fax)  [place]

## 2021-10-14 ENCOUNTER — Other Ambulatory Visit
Admission: RE | Admit: 2021-10-14 | Discharge: 2021-10-14 | Disposition: A | Discharge status: Home / Self Care | Payer: Medicare Other | Source: Ambulatory Visit | Attending: Medical | Admitting: Medical

## 2021-10-14 ENCOUNTER — Encounter: Payer: Self-pay | Admitting: Medical

## 2021-10-14 ENCOUNTER — Ambulatory Visit: Payer: Medicare Other | Attending: Medical | Admitting: Medical

## 2021-10-14 VITALS — BP 144/72 | HR 82 | Ht 65.0 in | Wt 136.2 lb

## 2021-10-14 DIAGNOSIS — R002 Palpitations: Secondary | ICD-10-CM | POA: Diagnosis not present

## 2021-10-14 DIAGNOSIS — I700 Atherosclerosis of aorta: Secondary | ICD-10-CM | POA: Diagnosis not present

## 2021-10-14 DIAGNOSIS — I10 Essential (primary) hypertension: Secondary | ICD-10-CM

## 2021-10-14 LAB — BASIC METABOLIC PANEL
Anion gap: 8 (ref 5–15)
BUN: 22 mg/dL (ref 8–23)
CO2: 29 mmol/L (ref 22–32)
Calcium: 9.4 mg/dL (ref 8.9–10.3)
Chloride: 101 mmol/L (ref 98–111)
Creatinine, Ser: 0.71 mg/dL (ref 0.44–1.00)
GFR, Estimated: >60 mL/min (ref >60)
Glucose, Bld: 88 mg/dL (ref 70–99)
Potassium: 3.6 mmol/L (ref 3.5–5.1)
Sodium: 138 mmol/L (ref 135–145)
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
## 2021-10-14 NOTE — Patient Instructions (Signed)
Medication Instructions:   Your physician has recommended you make the following change in your medication:    INCREASE your Losartan to 50 MG once a day.  *If you need a refill on your cardiac medications before your next appointment, please call your pharmacy*   Lab Work:  Please go to the [facility] after your appointment today for a BMP lab draw.    Follow-Up: At [facility], you and your health needs are our priority.  As part of our continuing mission to provide you with exceptional heart care, we have created designated Provider Care Teams.  These Care Teams include your primary Cardiologist (physician) and Advanced Practice Providers (APPs -  Physician Assistants and Nurse Practitioners) who all work together to provide you with the care you need, when you need it.  We recommend signing up for the patient portal called "MyChart".  Sign up information is provided on this After Visit Summary.  MyChart is used to connect with patients for Virtual Visits (Telemedicine).  Patients are able to view lab/test results, encounter notes, upcoming appointments, etc.  Non-urgent messages can be sent to your provider as well.   To learn more about what you can do with MyChart, go to [email or web address].    Your next appointment:   Keep your follow up appointment with Dr. Rockey Situ on 01/15/22  at 8:40 AM   The format for your next appointment:   In Person  Provider:   You may see Ida Rogue, MD or one of the following Advanced Practice Providers on your designated Care Team:   Murray Hodgkins, NP Christell Faith, PA-C Cadence Kathlen Mody, PA-C Gerrie Nordmann, NP    Other Instructions    Important Information About Sugar

## 2021-10-18 ENCOUNTER — Encounter: Payer: Self-pay | Admitting: Physician Assistant

## 2021-10-19 ENCOUNTER — Ambulatory Visit (INDEPENDENT_AMBULATORY_CARE_PROVIDER_SITE_OTHER): Payer: Medicare Other

## 2021-10-19 VITALS — Ht 65.0 in | Wt 136.0 lb

## 2021-10-19 DIAGNOSIS — Z0000 Encounter for general adult medical examination without abnormal findings: Secondary | ICD-10-CM
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
## 2021-10-19 NOTE — Patient Instructions (Signed)
Tracie Hall , Thank you for taking time to come for your Medicare Wellness Visit. I appreciate your ongoing commitment to your health goals. Please review the following plan we discussed and let me know if I can assist you in the future.   Screening recommendations/referrals: Colonoscopy: 07/02/19 Mammogram: scheduled 11/13/21 Bone Density: scheduled Feb 2023 Recommended yearly ophthalmology/optometry visit for glaucoma screening and checkup Recommended yearly dental visit for hygiene and checkup  Vaccinations: Influenza vaccine: 10/22/20 Pneumococcal vaccine: 09/16/20 Tdap vaccine: 10/25/18 Shingles vaccine: Zostavax 11/12/14   Shingrix 10/03/19, 01/30/20   Covid-19:02/14/19, 03/05/19, 12/04/19, 11/25/20  Advanced directives: no  Conditions/risks identified: none  Next appointment: Follow up in one year for your annual wellness visit 10/21/22 @ 9:15 am by phone   Preventive Care 65 Years and Older, Female Preventive care refers to lifestyle choices and visits with your health care provider that can promote health and wellness. What does preventive care include? A yearly physical exam. This is also called an annual well check. Dental exams once or twice a year. Routine eye exams. Ask your health care provider how often you should have your eyes checked. Personal lifestyle choices, including: Daily care of your teeth and gums. Regular physical activity. Eating a healthy diet. Avoiding tobacco and drug use. Limiting alcohol use. Practicing safe sex. Taking low-dose aspirin every day. Taking vitamin and mineral supplements as recommended by your health care provider. What happens during an annual well check? The services and screenings done by your health care provider during your annual well check will depend on your age, overall health, lifestyle risk factors, and family history of disease. Counseling  Your health care provider may ask you questions about your: Alcohol use. Tobacco  use. Drug use. Emotional well-being. Home and relationship well-being. Sexual activity. Eating habits. History of falls. Memory and ability to understand (cognition). Work and work Statistician. Reproductive health. Screening  You may have the following tests or measurements: Height, weight, and BMI. Blood pressure. Lipid and cholesterol levels. These may be checked every 5 years, or more frequently if you are over 44 years old. Skin check. Lung cancer screening. You may have this screening every year starting at age 28 if you have a 30-pack-year history of smoking and currently smoke or have quit within the past 15 years. Fecal occult blood test (FOBT) of the stool. You may have this test every year starting at age 23. Flexible sigmoidoscopy or colonoscopy. You may have a sigmoidoscopy every 5 years or a colonoscopy every 10 years starting at age 72. Hepatitis C blood test. Hepatitis B blood test. Sexually transmitted disease (STD) testing. Diabetes screening. This is done by checking your blood sugar (glucose) after you have not eaten for a while (fasting). You may have this done every 1-3 years. Bone density scan. This is done to screen for osteoporosis. You may have this done starting at age 8. Mammogram. This may be done every 1-2 years. Talk to your health care provider about how often you should have regular mammograms. Talk with your health care provider about your test results, treatment options, and if necessary, the need for more tests. Vaccines  Your health care provider may recommend certain vaccines, such as: Influenza vaccine. This is recommended every year. Tetanus, diphtheria, and acellular pertussis (Tdap, Td) vaccine. You may need a Td booster every 10 years. Zoster vaccine. You may need this after age 88. Pneumococcal 13-valent conjugate (PCV13) vaccine. One dose is recommended after age 10. Pneumococcal polysaccharide (PPSV23) vaccine. One dose  is recommended  after age 64. Talk to your health care provider about which screenings and vaccines you need and how often you need them. This information is not intended to replace advice given to you by your health care provider. Make sure you discuss any questions you have with your health care provider. Document Released: 02/07/2015 Document Revised: 10/01/2015 Document Reviewed: 11/12/2014 Elsevier Interactive Patient Education  2017 [place] Prevention in the Home Falls can cause injuries. They can happen to people of all ages. There are many things you can do to make your home safe and to help prevent falls. What can I do on the outside of my home? Regularly fix the edges of walkways and driveways and fix any cracks. Remove anything that might make you trip as you walk through a door, such as a raised step or threshold. Trim any bushes or trees on the path to your home. Use bright outdoor lighting. Clear any walking paths of anything that might make someone trip, such as rocks or tools. Regularly check to see if handrails are loose or broken. Make sure that both sides of any steps have handrails. Any raised decks and porches should have guardrails on the edges. Have any leaves, snow, or ice cleared regularly. Use sand or salt on walking paths during winter. Clean up any spills in your garage right away. This includes oil or grease spills. What can I do in the bathroom? Use night lights. Install grab bars by the toilet and in the tub and shower. Do not use towel bars as grab bars. Use non-skid mats or decals in the tub or shower. If you need to sit down in the shower, use a plastic, non-slip stool. Keep the floor dry. Clean up any water that spills on the floor as soon as it happens. Remove soap buildup in the tub or shower regularly. Attach bath mats securely with double-sided non-slip rug tape. Do not have throw rugs and other things on the floor that can make you trip. What can I do  in the bedroom? Use night lights. Make sure that you have a light by your bed that is easy to reach. Do not use any sheets or blankets that are too big for your bed. They should not hang down onto the floor. Have a firm chair that has side arms. You can use this for support while you get dressed. Do not have throw rugs and other things on the floor that can make you trip. What can I do in the kitchen? Clean up any spills right away. Avoid walking on wet floors. Keep items that you use a lot in easy-to-reach places. If you need to reach something above you, use a strong step stool that has a grab bar. Keep electrical cords out of the way. Do not use floor polish or wax that makes floors slippery. If you must use wax, use non-skid floor wax. Do not have throw rugs and other things on the floor that can make you trip. What can I do with my stairs? Do not leave any items on the stairs. Make sure that there are handrails on both sides of the stairs and use them. Fix handrails that are broken or loose. Make sure that handrails are as long as the stairways. Check any carpeting to make sure that it is firmly attached to the stairs. Fix any carpet that is loose or worn. Avoid having throw rugs at the top or bottom of the stairs.  If you do have throw rugs, attach them to the floor with carpet tape. Make sure that you have a light switch at the top of the stairs and the bottom of the stairs. If you do not have them, ask someone to add them for you. What else can I do to help prevent falls? Wear shoes that: Do not have high heels. Have rubber bottoms. Are comfortable and fit you well. Are closed at the toe. Do not wear sandals. If you use a stepladder: Make sure that it is fully opened. Do not climb a closed stepladder. Make sure that both sides of the stepladder are locked into place. Ask someone to hold it for you, if possible. Clearly mark and make sure that you can see: Any grab bars or  handrails. First and last steps. Where the edge of each step is. Use tools that help you move around (mobility aids) if they are needed. These include: Canes. Walkers. Scooters. Crutches. Turn on the lights when you go into a dark area. Replace any light bulbs as soon as they burn out. Set up your furniture so you have a clear path. Avoid moving your furniture around. If any of your floors are uneven, fix them. If there are any pets around you, be aware of where they are. Review your medicines with your doctor. Some medicines can make you feel dizzy. This can increase your chance of falling. Ask your doctor what other things that you can do to help prevent falls. This information is not intended to replace advice given to you by your health care provider. Make sure you discuss any questions you have with your health care provider. Document Released: 11/07/2008 Document Revised: 06/19/2015 Document Reviewed: 02/15/2014 Elsevier Interactive Patient Education  2017 [employer].

## 2021-10-20 ENCOUNTER — Encounter: Payer: Self-pay | Admitting: Physician Assistant

## 2021-10-20 ENCOUNTER — Ambulatory Visit (INDEPENDENT_AMBULATORY_CARE_PROVIDER_SITE_OTHER): Payer: Medicare Other | Admitting: Physician Assistant

## 2021-10-20 VITALS — BP 168/88 | HR 68 | Temp 97.7°F | Resp 14 | Wt 135.0 lb

## 2021-10-20 DIAGNOSIS — S80861S Insect bite (nonvenomous), right lower leg, sequela: Secondary | ICD-10-CM

## 2021-10-20 DIAGNOSIS — I10 Essential (primary) hypertension: Secondary | ICD-10-CM | POA: Diagnosis not present

## 2021-10-20 DIAGNOSIS — R200 Anesthesia of skin: Secondary | ICD-10-CM

## 2021-10-20 DIAGNOSIS — R5383 Other fatigue: Secondary | ICD-10-CM | POA: Diagnosis not present

## 2021-10-20 DIAGNOSIS — W57XXXS Bitten or stung by nonvenomous insect and other nonvenomous arthropods, sequela: Secondary | ICD-10-CM | POA: Diagnosis not present

## 2021-10-20 DIAGNOSIS — R802 Orthostatic proteinuria, unspecified: Secondary | ICD-10-CM | POA: Diagnosis not present

## 2021-10-20 NOTE — Progress Notes (Signed)
I,Roshena L Chambers,acting as a Education administrator for [employer], PA-C.,have documented all relevant documentation on the behalf of Mardene Speak, PA-C,as directed by  [employer], PA-C while in the presence of [employer], PA-C.    Established patient visit   Patient: Tracie Hall   DOB: October 07, 1955   66 y.o. Female  MRN: [id number] Visit Date: 10/20/2021  Today's healthcare provider: Mardene Speak, PA-C   Chief Complaint  Patient presents with   Follow-up   Subjective    HPI  Follow up for tick bite:  The patient was last seen for this on 10/12/2021.   Today patient complains of intermittent tingling/ burning sensations of the upper right leg (thigh area). For the past few mornings, she reports waking up with numbness in her thigh. The numbness seems to go away after massaging. Patient would like blood work to check for tick illnesses.  BP at home has been around 120. Her BP cuff was checked at Cardiology office on 10/14/21 Her BP at Cardiology office was 144/72. Dose of losartan was increased to '50mg'$   Medications: Outpatient Medications Prior to Visit  Medication Sig   CALCIUM-VITAMIN D PO Take 600 mg by mouth daily.   cetirizine (ZYRTEC) 10 MG tablet Take 1 tablet (10 mg total) by mouth daily. (Patient taking differently: Take 10 mg by mouth as needed.)   conjugated estrogens (PREMARIN) vaginal cream PLACE 9.93 APPLICATORFULS VAGINALLY 2 (TWO) TIMES A WEEK. (Patient taking differently: as needed. PLACE 7.16 APPLICATORFULS VAGINALLY 2 (TWO) TIMES A WEEK.)   cyanocobalamin 1000 MCG tablet Take by mouth.   losartan (COZAAR) 50 MG tablet Take 1 tablet (50 mg total) by mouth daily.   metoprolol succinate (TOPROL-XL) 50 MG 24 hr tablet TAKE 1 TABLET(50 MG) BY MOUTH DAILY WITH OR IMMEDIATELY FOLLOWING A MEAL   [DISCONTINUED] amoxicillin (AMOXIL) 875 MG tablet Take 1 tablet by mouth 2 (two) times daily. (Patient not taking: Reported on 10/19/2021)   [DISCONTINUED] cyclobenzaprine  (FLEXERIL) 5 MG tablet  (Patient not taking: Reported on 10/19/2021)   [DISCONTINUED] Dapsone (ACZONE) 7.5 % GEL  (Patient not taking: Reported on 10/20/2021)   [DISCONTINUED] metoprolol succinate (TOPROL-XL) 25 MG 24 hr tablet Take 1 tablet by mouth daily. (Patient not taking: Reported on 10/20/2021)   No facility-administered medications prior to visit.    Review of Systems  Constitutional:  Negative for appetite change, chills, fatigue and fever.  Respiratory:  Negative for chest tightness and shortness of breath.   Cardiovascular:  Negative for chest pain and palpitations.  Gastrointestinal:  Negative for abdominal pain, nausea and vomiting.  Musculoskeletal:  Positive for myalgias.  Neurological:  Negative for dizziness and weakness.       Objective    BP (!) 168/88 (BP Location: Right Arm, Patient Position: Sitting, Cuff Size: Normal)   Pulse 68   Temp 97.7 F (36.5 C) (Oral)   Resp 14   Wt 135 lb (61.2 kg)   SpO2 100% Comment: room air  BMI 22.47 kg/m    Today's Vitals   10/20/21 1542 10/20/21 1546  BP: (!) 154/84 (!) 168/88  Pulse: 66 68  Resp: 14   Temp: 97.7 F (36.5 C)   TempSrc: Oral   SpO2: 100%   Weight: 135 lb (61.2 kg)    Body mass index is 22.47 kg/m.   Physical Exam Vitals reviewed.  Constitutional:      General: She is not in acute distress.    Appearance: Normal appearance. She is well-developed.  She is not diaphoretic.  HENT:     Head: Normocephalic and atraumatic.     Nose: Nose normal.  Eyes:     General: No scleral icterus.    Extraocular Movements: Extraocular movements intact.     Conjunctiva/sclera: Conjunctivae normal.     Pupils: Pupils are equal, round, and reactive to light.  Neck:     Thyroid: No thyromegaly.  Cardiovascular:     Rate and Rhythm: Normal rate and regular rhythm.     Pulses: Normal pulses.     Heart sounds: Normal heart sounds. No murmur heard. Pulmonary:     Effort: Pulmonary effort is normal. No respiratory  distress.     Breath sounds: Normal breath sounds. No wheezing, rhonchi or rales.  Musculoskeletal:     Cervical back: Neck supple.     Right lower leg: No edema.     Left lower leg: No edema.  Lymphadenopathy:     Cervical: No cervical adenopathy.  Skin:    General: Skin is warm and dry.     Findings: No rash.  Neurological:     General: No focal deficit present.     Mental Status: She is alert and oriented to person, place, and time. Mental status is at baseline.  Psychiatric:        Behavior: Behavior normal.        Thought Content: Thought content normal.        Judgment: Judgment normal.       No results found for any visits on 10/20/21.  Assessment & Plan     Tick bite of calf, right, sequela With an episode of R thigh numbness 2 days ago. Per pt request, lyme titers were ordered  - Lyme Disease Serology w/Reflex  HTN BP today was 168/88, 162/89, 154/84. Denies having cp, sob, palpitation, headache, vision change Pt believes it could be associated with a white coat syndrome Pt advised to communicated with Cardiology office regarding her BP as she prefers to make changes only per cardiology recommendation.  Pt was advised to measure her BP at home and contact cardiology if her BP will be more than 140/90  FU with Dr. B in 6 weeks    The patient was advised to call back or seek an in-person evaluation if the symptoms worsen or if the condition fails to improve as anticipated.  I discussed the assessment and treatment plan with the patient. The patient was provided an opportunity to ask questions and all were answered. The patient agreed with the plan and demonstrated an understanding of the instructions.  The entirety of the information documented in the History of Present Illness, Review of Systems and Physical Exam were personally obtained by me. Portions of this information were initially documented by the CMA and reviewed by me for thoroughness and accuracy.    Portions of this note were created using dictation software and may contain typographical errors.   Mardene Speak, PA-C  [facility] [phone] (phone) [phone] (fax)  [place]

## 2021-10-21 ENCOUNTER — Encounter: Payer: Self-pay | Admitting: Cardiovascular Disease

## 2021-10-21 LAB — LYME DISEASE SEROLOGY W/REFLEX: Lyme Total Antibody EIA: NEGATIVE

## 2021-10-22 NOTE — Progress Notes (Signed)
Good news, Tracie Hall, lyme antibody test is negative  Mardene Speak, [facility], [facility] [phone])

## 2021-10-26 ENCOUNTER — Ambulatory Visit: Payer: Medicare HMO

## 2021-11-13 ENCOUNTER — Ambulatory Visit
Admission: RE | Admit: 2021-11-13 | Discharge: 2021-11-13 | Disposition: A | Discharge status: Home / Self Care | Payer: Medicare Other | Source: Ambulatory Visit | Attending: Family Medicine | Admitting: Family Medicine

## 2021-11-13 DIAGNOSIS — Z1231 Encounter for screening mammogram for malignant neoplasm of breast: Secondary | ICD-10-CM

## 2021-11-17 ENCOUNTER — Ambulatory Visit (INDEPENDENT_AMBULATORY_CARE_PROVIDER_SITE_OTHER): Payer: Medicare Other

## 2021-11-17 DIAGNOSIS — Z23 Encounter for immunization: Secondary | ICD-10-CM

## 2021-11-18 NOTE — Progress Notes (Signed)
Hi Daphney  Normal mammogram; repeat in 1 year.  Please let us know if you have any questions.  Thank you,  Tally Joe, FNP

## 2021-11-19 NOTE — Progress Notes (Signed)
I,Sulibeya S Dimas,acting as a Education administrator for Lavon Paganini, MD.,have documented all relevant documentation on the behalf of Lavon Paganini, MD,as directed by  Lavon Paganini, MD while in the presence of Lavon Paganini, MD.     Established patient visit   Patient: Tracie Hall   DOB: 05/02/1955   66 y.o. Female  MRN: [id number] Visit Date: 11/23/2021  Today's healthcare provider: Lavon Paganini, MD   Chief Complaint  Patient presents with   Hypertension   Subjective    HPI  Hypertension, follow-up  BP Readings from Last 3 Encounters:  11/23/21 115/75  10/20/21 (!) 168/88  10/14/21 (!) 144/72   Wt Readings from Last 3 Encounters:  11/23/21 136 lb (61.7 kg)  10/20/21 135 lb (61.2 kg)  10/19/21 136 lb (61.7 kg)     She was last seen for hypertension 2 months ago.  BP at that visit was 168/88. Management since that visit includes no changes. Patient advised to monitor BP at home and contact cardiology with BP more than 140/90.  She reports excellent compliance with treatment. She is not having side effects.  She is following a Low Sodium diet. She is exercising. She does not smoke.  Use of agents associated with hypertension: none.   Outside blood pressures are stable. Home readings range 110-130s/70-80s   Symptoms: No chest pain No chest pressure  No palpitations No syncope  No dyspnea No orthopnea  No paroxysmal nocturnal dyspnea No lower extremity edema   Pertinent labs Lab Results  Component Value Date   CHOL 170 09/21/2021   HDL 80 09/21/2021   LDLCALC 77 09/21/2021   TRIG 70 09/21/2021   CHOLHDL 2.1 09/21/2021   Lab Results  Component Value Date   NA 138 10/14/2021   K 3.6 10/14/2021   CREATININE 0.71 10/14/2021   GFRNONAA >60 10/14/2021   GLUCOSE 88 10/14/2021   TSH 3.530 11/09/2018     The 10-year ASCVD risk score (Arnett DK, et al., 2019) is:  6.2%  --------------------------------------------------------------------------------------------------- Occasional R thigh numbness - transient - discussed meralgia parasthetica  Medications: Outpatient Medications Prior to Visit  Medication Sig   CALCIUM-VITAMIN D PO Take 600 mg by mouth daily.   cetirizine (ZYRTEC) 10 MG tablet Take 1 tablet (10 mg total) by mouth daily. (Patient taking differently: Take 10 mg by mouth as needed.)   conjugated estrogens (PREMARIN) vaginal cream PLACE 2.87 APPLICATORFULS VAGINALLY 2 (TWO) TIMES A WEEK. (Patient taking differently: as needed. PLACE 8.67 APPLICATORFULS VAGINALLY 2 (TWO) TIMES A WEEK.)   cyanocobalamin 1000 MCG tablet Take by mouth.   losartan (COZAAR) 50 MG tablet Take 1 tablet (50 mg total) by mouth daily.   [DISCONTINUED] metoprolol succinate (TOPROL-XL) 50 MG 24 hr tablet TAKE 1 TABLET(50 MG) BY MOUTH DAILY WITH OR IMMEDIATELY FOLLOWING A MEAL   No facility-administered medications prior to visit.    Review of Systems  Constitutional:  Negative for appetite change and fatigue.  Eyes:  Negative for visual disturbance.  Respiratory:  Negative for chest tightness and shortness of breath.   Cardiovascular:  Negative for chest pain, palpitations and leg swelling.  Gastrointestinal:  Negative for nausea and vomiting.  Neurological:  Negative for headaches.       Objective    BP 115/75 Comment: home reading  Pulse 82   Temp 98.5 F (36.9 C) (Oral)   Resp 16   Wt 136 lb (61.7 kg)   BMI 22.63 kg/m  BP Readings from Last 3 Encounters:  11/23/21 115/75  10/20/21 (!) 168/88  10/14/21 (!) 144/72   Wt Readings from Last 3 Encounters:  11/23/21 136 lb (61.7 kg)  10/20/21 135 lb (61.2 kg)  10/19/21 136 lb (61.7 kg)      Physical Exam Vitals reviewed.  Constitutional:      General: She is not in acute distress.    Appearance: Normal appearance. She is well-developed. She is not diaphoretic.  HENT:     Head: Normocephalic  and atraumatic.  Eyes:     General: No scleral icterus.    Conjunctiva/sclera: Conjunctivae normal.  Neck:     Thyroid: No thyromegaly.  Cardiovascular:     Rate and Rhythm: Normal rate and regular rhythm.     Pulses: Normal pulses.     Heart sounds: Normal heart sounds. No murmur heard. Pulmonary:     Effort: Pulmonary effort is normal. No respiratory distress.     Breath sounds: Normal breath sounds. No wheezing, rhonchi or rales.  Musculoskeletal:     Cervical back: Neck supple.     Right lower leg: No edema.     Left lower leg: No edema.  Lymphadenopathy:     Cervical: No cervical adenopathy.  Skin:    General: Skin is warm and dry.     Findings: No rash.  Neurological:     Mental Status: She is alert and oriented to person, place, and time. Mental status is at baseline.  Psychiatric:        Mood and Affect: Mood normal.        Behavior: Behavior normal.       No results found for any visits on 11/23/21.  Assessment & Plan     Problem List Items Addressed This Visit       Cardiovascular and Mediastinum   Hypertension - Primary    Well controlled on home readings (home cuff consistent with office reading) Continue current medications Reviewed recent metabolic panel      Relevant Medications   metoprolol succinate (TOPROL-XL) 50 MG 24 hr tablet     Nervous and Auditory   Benign essential tremor    Had previously been started on metoprolol, but states that her tremor was never impactful She is wondering about possibly stopping metoprolol We discussed that this is a possibility and she is welcome to try and watch her blood pressure at home We could always increase losartan dose to compensate for the metoprolol if needed for blood pressure control        Other   Hyperlipidemia    Reviewed last lipid panel Not currently on a statin Discussed ASCVD risk and recalculated with her home blood pressures which comes out to about 6.4%, so she does not need a statin  at this time Continue to monitor      Relevant Medications   metoprolol succinate (TOPROL-XL) 50 MG 24 hr tablet   Other Visit Diagnoses     Essential hypertension       Relevant Medications   metoprolol succinate (TOPROL-XL) 50 MG 24 hr tablet        Return for as scheduled.      I, Lavon Paganini, MD, have reviewed all documentation for this visit. The documentation on 11/23/21 for the exam, diagnosis, procedures, and orders are all accurate and complete.   Dove Gresham, Dionne Bucy, MD, MPH [facility]

## 2021-11-20 ENCOUNTER — Other Ambulatory Visit: Payer: Self-pay | Admitting: Family Medicine

## 2021-11-20 DIAGNOSIS — I10 Essential (primary) hypertension: Secondary | ICD-10-CM

## 2021-11-23 ENCOUNTER — Ambulatory Visit (INDEPENDENT_AMBULATORY_CARE_PROVIDER_SITE_OTHER): Payer: Medicare Other | Admitting: Family Medicine

## 2021-11-23 ENCOUNTER — Encounter: Payer: Self-pay | Admitting: Family Medicine

## 2021-11-23 VITALS — BP 115/75 | HR 82 | Temp 98.5°F | Resp 16 | Wt 136.0 lb

## 2021-11-23 DIAGNOSIS — E782 Mixed hyperlipidemia: Secondary | ICD-10-CM

## 2021-11-23 DIAGNOSIS — I10 Essential (primary) hypertension: Secondary | ICD-10-CM | POA: Diagnosis not present

## 2021-11-23 DIAGNOSIS — G250 Essential tremor: Secondary | ICD-10-CM

## 2021-11-23 MED ORDER — METOPROLOL SUCCINATE ER 50 MG PO TB24: 50.0000 mg | ORAL_TABLET | Freq: Every day | ORAL | 3 refills | Status: DC

## 2021-11-23 NOTE — Assessment & Plan Note (Signed)
Had previously been started on metoprolol, but states that her tremor was never impactful She is wondering about possibly stopping metoprolol We discussed that this is a possibility and she is welcome to try and watch her blood pressure at home We could always increase losartan dose to compensate for the metoprolol if needed for blood pressure control

## 2021-11-23 NOTE — Assessment & Plan Note (Signed)
Reviewed last lipid panel Not currently on a statin Discussed ASCVD risk and recalculated with her home blood pressures which comes out to about 6.4%, so she does not need a statin at this time Continue to monitor

## 2021-11-23 NOTE — Assessment & Plan Note (Signed)
Well controlled on home readings (home cuff consistent with office reading) Continue current medications Reviewed recent metabolic panel

## 2022-01-06 ENCOUNTER — Ambulatory Visit: Payer: Medicare Other | Admitting: Dermatology

## 2022-01-06 DIAGNOSIS — L821 Other seborrheic keratosis: Secondary | ICD-10-CM

## 2022-01-06 DIAGNOSIS — Z86018 Personal history of other benign neoplasm: Secondary | ICD-10-CM

## 2022-01-06 DIAGNOSIS — D229 Melanocytic nevi, unspecified: Secondary | ICD-10-CM

## 2022-01-06 DIAGNOSIS — L578 Other skin changes due to chronic exposure to nonionizing radiation: Secondary | ICD-10-CM

## 2022-01-06 DIAGNOSIS — L570 Actinic keratosis: Secondary | ICD-10-CM

## 2022-01-06 DIAGNOSIS — L814 Other melanin hyperpigmentation: Secondary | ICD-10-CM | POA: Diagnosis not present

## 2022-01-06 DIAGNOSIS — L820 Inflamed seborrheic keratosis: Secondary | ICD-10-CM

## 2022-01-06 DIAGNOSIS — Z1283 Encounter for screening for malignant neoplasm of skin: Secondary | ICD-10-CM | POA: Diagnosis not present

## 2022-01-06 NOTE — Progress Notes (Signed)
Follow-Up Visit   Subjective  Tracie Hall is a 66 y.o. female who presents for the following: Annual Exam. Hx of Dysplastic nevus.  The patient presents for Total-Body Skin Exam (TBSE) for skin cancer screening and mole check.  The patient has spots, moles and lesions to be evaluated, some may be new or changing and the patient has concerns that these could be cancer.   The following portions of the chart were reviewed this encounter and updated as appropriate:   Tobacco  Allergies  Meds  Problems  Med Hx  Surg Hx  Fam Hx      Review of Systems:  No other skin or systemic complaints except as noted in HPI or Assessment and Plan.  Objective  Well appearing patient in no apparent distress; mood and affect are within normal limits.  A full examination was performed including scalp, head, eyes, ears, nose, lips, neck, chest, axillae, abdomen, back, buttocks, bilateral upper extremities, bilateral lower extremities, hands, feet, fingers, toes, fingernails, and toenails. All findings within normal limits unless otherwise noted below.  Nose Erythematous thin papules/macules with gritty scale.   left lateral canthus x 3, left hand x 1  (4) (4) Stuck-on, waxy, tan-brown papules -- Discussed benign etiology and prognosis.   face Scattered tan macules.    Assessment & Plan  AK (actinic keratosis) Nose  Actinic keratoses are precancerous spots that appear secondary to cumulative UV radiation exposure/sun exposure over time. They are chronic with expected duration over 1 year. A portion of actinic keratoses will progress to squamous cell carcinoma of the skin. It is not possible to reliably predict which spots will progress to skin cancer and so treatment is recommended to prevent development of skin cancer.  Recommend daily broad spectrum sunscreen SPF 30+ to sun-exposed areas, reapply every 2 hours as needed.  Recommend staying in the shade or wearing long sleeves, sun glasses  (UVA+UVB protection) and wide brim hats (4-inch brim around the entire circumference of the hat). Call for new or changing lesions.   Destruction of lesion - Nose Complexity: simple   Destruction method: cryotherapy   Informed consent: discussed and consent obtained   Timeout:  patient name, date of birth, surgical site, and procedure verified Lesion destroyed using liquid nitrogen: Yes   Region frozen until ice ball extended beyond lesion: Yes   Outcome: patient tolerated procedure well with no complications   Post-procedure details: wound care instructions given    Inflamed seborrheic keratosis (4) left lateral canthus x 3, left hand x 1  (4)  Symptomatic, irritating, patient would like treated.   Destruction of lesion - left lateral canthus x 3, left hand x 1  (4) Complexity: simple   Destruction method: cryotherapy   Informed consent: discussed and consent obtained   Timeout:  patient name, date of birth, surgical site, and procedure verified Lesion destroyed using liquid nitrogen: Yes   Region frozen until ice ball extended beyond lesion: Yes   Outcome: patient tolerated procedure well with no complications   Post-procedure details: wound care instructions given    Lentigo face  Discussed the treatment option of BBL/laser.  Typically we recommend 1-3 treatment sessions about 5-8 weeks apart for best results.  The patient's condition may require "maintenance treatments" in the future.  The fee for BBL / laser treatments is $350 per treatment session for the whole face.  A fee can be quoted for other parts of the body. Insurance typically does not pay for BBL/laser  treatments and therefore the fee is an out-of-pocket cost.   Lentigines - Scattered tan macules - Due to sun exposure - Benign-appearing, observe - Recommend daily broad spectrum sunscreen SPF 30+ to sun-exposed areas, reapply every 2 hours as needed. - Call for any changes  Seborrheic Keratoses - Stuck-on,  waxy, tan-brown papules and/or plaques  - Benign-appearing - Discussed benign etiology and prognosis. - Observe - Call for any changes  Melanocytic Nevi - Tan-brown and/or pink-flesh-colored symmetric macules and papules - Benign appearing on exam today - Observation - Call clinic for new or changing moles - Recommend daily use of broad spectrum spf 30+ sunscreen to sun-exposed areas.   Hemangiomas - Red papules - Discussed benign nature - Observe - Call for any changes  Actinic Damage - Chronic condition, secondary to cumulative UV/sun exposure - diffuse scaly erythematous macules with underlying dyspigmentation - Recommend daily broad spectrum sunscreen SPF 30+ to sun-exposed areas, reapply every 2 hours as needed.  - Staying in the shade or wearing long sleeves, sun glasses (UVA+UVB protection) and wide brim hats (4-inch brim around the entire circumference of the hat) are also recommended for sun protection.  - Call for new or changing lesions.  History of Dysplastic Nevi Multiple see history - No evidence of recurrence today - Recommend regular full body skin exams - Recommend daily broad spectrum sunscreen SPF 30+ to sun-exposed areas, reapply every 2 hours as needed.  - Call if any new or changing lesions are noted between office visits   Skin cancer screening performed today.   Return in about 1 year (around 01/07/2023) for TBSE, hx of Dysplastic nevus .  IMarye Round, CMA, am acting as scribe for Sarina Ser, MD .  Documentation: I have reviewed the above documentation for accuracy and completeness, and I agree with the above.  Sarina Ser, MD

## 2022-01-06 NOTE — Patient Instructions (Addendum)
Cryotherapy Aftercare  Wash gently with soap and water everyday.   Apply Vaseline and Band-Aid daily until healed.     Due to recent changes in healthcare laws, you may see results of your pathology and/or laboratory studies on MyChart before the doctors have had a chance to review them. We understand that in some cases there may be results that are confusing or concerning to you. Please understand that not all results are received at the same time and often the doctors may need to interpret multiple results in order to provide you with the best plan of care or course of treatment. Therefore, we ask that you please give us 2 business days to thoroughly review all your results before contacting the office for clarification. Should we see a critical lab result, you will be contacted sooner.   If You Need Anything After Your Visit  If you have any questions or concerns for your doctor, please call our main line at [phone] and press option 4 to reach your doctor's medical assistant. If no one answers, please leave a voicemail as directed and we will return your call as soon as possible. Messages left after 4 pm will be answered the following business day.   You may also send us a message via MyChart. We typically respond to MyChart messages within 1-2 business days.  For prescription refills, please ask your pharmacy to contact our office. Our fax number is [phone].  If you have an urgent issue when the clinic is closed that cannot wait until the next business day, you can page your doctor at the number below.    Please note that while we do our best to be available for urgent issues outside of office hours, we are not available 24/7.   If you have an urgent issue and are unable to reach us, you may choose to seek medical care at your doctor's office, retail clinic, urgent care center, or emergency room.  If you have a medical emergency, please immediately call 911 or go to the  emergency department.  Pager Numbers  - Dr. Kowalski: [phone]  - Dr. Moye: [phone]  - Dr. Stewart: [phone]  In the event of inclement weather, please call our main line at [phone] for an update on the status of any delays or closures.  Dermatology Medication Tips: Please keep the boxes that topical medications come in in order to help keep track of the instructions about where and how to use these. Pharmacies typically print the medication instructions only on the boxes and not directly on the medication tubes.   If your medication is too expensive, please contact our office at [phone] option 4 or send us a message through MyChart.   We are unable to tell what your co-pay for medications will be in advance as this is different depending on your insurance coverage. However, we may be able to find a substitute medication at lower cost or fill out paperwork to get insurance to cover a needed medication.   If a prior authorization is required to get your medication covered by your insurance company, please allow us 1-2 business days to complete this process.  Drug prices often vary depending on where the prescription is filled and some pharmacies may offer cheaper prices.  The website www.goodrx.com contains coupons for medications through different pharmacies. The prices here do not account for what the cost may be with help from insurance (it may be cheaper with your insurance), but the website can   give you the price if you did not use any insurance.  - You can print the associated coupon and take it with your prescription to the pharmacy.  - You may also stop by our office during regular business hours and pick up a GoodRx coupon card.  - If you need your prescription sent electronically to a different pharmacy, notify our office through [facility] MyChart or by phone at [phone] option 4.     Si Usted Necesita Algo Despus de Su Visita  Tambin puede  enviarnos un mensaje a travs de MyChart. Por lo general respondemos a los mensajes de MyChart en el transcurso de 1 a 2 das hbiles.  Para renovar recetas, por favor pida a su farmacia que se ponga en contacto con nuestra oficina. Nuestro nmero de fax es el [phone].  Si tiene un asunto urgente cuando la clnica est cerrada y que no puede esperar hasta el siguiente da hbil, puede llamar/localizar a su doctor(a) al nmero que aparece a continuacin.   Por favor, tenga en cuenta que aunque hacemos todo lo posible para estar disponibles para asuntos urgentes fuera del horario de oficina, no estamos disponibles las 24 horas del da, los 7 das de la semana.   Si tiene un problema urgente y no puede comunicarse con nosotros, puede optar por buscar atencin mdica  en el consultorio de su doctor(a), en una clnica privada, en un centro de atencin urgente o en una sala de emergencias.  Si tiene una emergencia mdica, por favor llame inmediatamente al 911 o vaya a la sala de emergencias.  Nmeros de bper  - Dr. Kowalski: [phone]  - Dra. Moye: [phone]  - Dra. Stewart: [phone]  En caso de inclemencias del tiempo, por favor llame a nuestra lnea principal al [phone] para una actualizacin sobre el estado de cualquier retraso o cierre.  Consejos para la medicacin en dermatologa: Por favor, guarde las cajas en las que vienen los medicamentos de uso tpico para ayudarle a seguir las instrucciones sobre dnde y cmo usarlos. Las farmacias generalmente imprimen las instrucciones del medicamento slo en las cajas y no directamente en los tubos del medicamento.   Si su medicamento es muy caro, por favor, pngase en contacto con nuestra oficina llamando al [phone] y presione la opcin 4 o envenos un mensaje a travs de MyChart.   No podemos decirle cul ser su copago por los medicamentos por adelantado ya que esto es diferente dependiendo de la cobertura de su seguro.  Sin embargo, es posible que podamos encontrar un medicamento sustituto a menor costo o llenar un formulario para que el seguro cubra el medicamento que se considera necesario.   Si se requiere una autorizacin previa para que su compaa de seguros cubra su medicamento, por favor permtanos de 1 a 2 das hbiles para completar este proceso.  Los precios de los medicamentos varan con frecuencia dependiendo del lugar de dnde se surte la receta y alguna farmacias pueden ofrecer precios ms baratos.  El sitio web www.goodrx.com tiene cupones para medicamentos de diferentes farmacias. Los precios aqu no tienen en cuenta lo que podra costar con la ayuda del seguro (puede ser ms barato con su seguro), pero el sitio web puede darle el precio si no utiliz ningn seguro.  - Puede imprimir el cupn correspondiente y llevarlo con su receta a la farmacia.  - Tambin puede pasar por nuestra oficina durante el horario de atencin regular y recoger una tarjeta de cupones de GoodRx.  -   Si necesita que su receta se enve electrnicamente a una farmacia diferente, informe a nuestra oficina a travs de MyChart de [facility] o por telfono llamando al [phone] y presione la opcin 4.  
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
## 2022-01-15 ENCOUNTER — Encounter: Payer: Self-pay | Admitting: Cardiovascular Disease

## 2022-01-15 ENCOUNTER — Ambulatory Visit: Payer: Medicare Other | Attending: Cardiovascular Disease | Admitting: Cardiovascular Disease

## 2022-01-15 VITALS — BP 136/70 | HR 85 | Ht 65.0 in | Wt 136.0 lb

## 2022-01-15 DIAGNOSIS — I700 Atherosclerosis of aorta: Secondary | ICD-10-CM | POA: Diagnosis not present

## 2022-01-15 DIAGNOSIS — R7303 Prediabetes: Secondary | ICD-10-CM

## 2022-01-15 DIAGNOSIS — E782 Mixed hyperlipidemia: Secondary | ICD-10-CM

## 2022-01-15 DIAGNOSIS — I10 Essential (primary) hypertension: Secondary | ICD-10-CM

## 2022-01-15 DIAGNOSIS — R002 Palpitations: Secondary | ICD-10-CM

## 2022-01-15 NOTE — Patient Instructions (Signed)
Medication Instructions:  No changes  If you need a refill on your cardiac medications before your next appointment, please call your pharmacy.   Lab work: No new labs needed  Testing/Procedures: No new testing needed  Follow-Up: At [facility], you and your health needs are our priority.  As part of our continuing mission to provide you with exceptional heart care, we have created designated Provider Care Teams.  These Care Teams include your primary Cardiologist (physician) and Advanced Practice Providers (APPs -  Physician Assistants and Nurse Practitioners) who all work together to provide you with the care you need, when you need it.  You will need a follow up appointment in 12 months  Providers on your designated Care Team:   Christopher Berge, NP Ryan Dunn, PA-C Cadence Furth, PA-C  COVID-19 Vaccine Information can be found at: [email or web address] For questions related to vaccine distribution or appointments, please email [email or web address] or call [phone].   

## 2022-01-21 ENCOUNTER — Encounter: Payer: Self-pay | Admitting: Dermatology

## 2022-02-16 DIAGNOSIS — N941 Unspecified dyspareunia: Secondary | ICD-10-CM | POA: Diagnosis not present

## 2022-02-16 DIAGNOSIS — Z1331 Encounter for screening for depression: Secondary | ICD-10-CM | POA: Diagnosis not present

## 2022-02-16 DIAGNOSIS — N898 Other specified noninflammatory disorders of vagina: Secondary | ICD-10-CM | POA: Diagnosis not present

## 2022-02-16 DIAGNOSIS — Z01411 Encounter for gynecological examination (general) (routine) with abnormal findings: Secondary | ICD-10-CM | POA: Diagnosis not present

## 2022-02-18 ENCOUNTER — Other Ambulatory Visit: Payer: Self-pay | Admitting: Family Medicine

## 2022-02-18 DIAGNOSIS — I10 Essential (primary) hypertension: Secondary | ICD-10-CM

## 2022-03-17 ENCOUNTER — Ambulatory Visit
Admission: RE | Admit: 2022-03-17 | Discharge: 2022-03-17 | Disposition: A | Discharge status: Home / Self Care | Payer: Medicare Other | Source: Ambulatory Visit | Attending: Family Medicine | Admitting: Family Medicine

## 2022-03-17 DIAGNOSIS — Z780 Asymptomatic menopausal state: Secondary | ICD-10-CM | POA: Diagnosis not present

## 2022-03-17 DIAGNOSIS — M8589 Other specified disorders of bone density and structure, multiple sites: Secondary | ICD-10-CM | POA: Diagnosis not present

## 2022-03-17 DIAGNOSIS — Z0000 Encounter for general adult medical examination without abnormal findings: Secondary | ICD-10-CM

## 2022-03-18 ENCOUNTER — Encounter: Payer: Self-pay | Admitting: Family Medicine

## 2022-03-18 DIAGNOSIS — M8580 Other specified disorders of bone density and structure, unspecified site: Secondary | ICD-10-CM | POA: Insufficient documentation

## 2022-03-25 ENCOUNTER — Ambulatory Visit (INDEPENDENT_AMBULATORY_CARE_PROVIDER_SITE_OTHER): Payer: Medicare Other | Admitting: Family Medicine

## 2022-03-25 ENCOUNTER — Encounter: Payer: Self-pay | Admitting: Family Medicine

## 2022-03-25 VITALS — BP 132/81 | HR 77 | Temp 97.6°F | Resp 12 | Wt 138.6 lb

## 2022-03-25 DIAGNOSIS — I10 Essential (primary) hypertension: Secondary | ICD-10-CM

## 2022-03-25 DIAGNOSIS — E782 Mixed hyperlipidemia: Secondary | ICD-10-CM | POA: Diagnosis not present

## 2022-03-25 DIAGNOSIS — R7303 Prediabetes: Secondary | ICD-10-CM | POA: Diagnosis not present

## 2022-03-25 NOTE — Progress Notes (Signed)
I,Sulibeya S Dimas,acting as a Education administrator for Lavon Paganini, MD.,have documented all relevant documentation on the behalf of Lavon Paganini, MD,as directed by  Lavon Paganini, MD while in the presence of Lavon Paganini, MD.     Established patient visit   Patient: Tracie Hall   DOB: Sep 27, 1955   67 y.o. Female  MRN: [id number] Visit Date: 03/25/2022  Today's healthcare provider: Lavon Paganini, MD   Chief Complaint  Patient presents with   Hypertension   Subjective    HPI  Hypertension, follow-up  BP Readings from Last 3 Encounters:  03/25/22 132/81  01/15/22 136/70  11/23/21 115/75   Wt Readings from Last 3 Encounters:  03/25/22 138 lb 9.6 oz (62.9 kg)  01/15/22 136 lb (61.7 kg)  11/23/21 136 lb (61.7 kg)     She was last seen for hypertension 4 months ago.  BP at that visit was 115/75. Management since that visit includes no changes.  She reports excellent compliance with treatment. She is not having side effects.   Outside blood pressures are stable. At home BP 112/62,123/71, 117/74   Pertinent labs Lab Results  Component Value Date   CHOL 170 09/21/2021   HDL 80 09/21/2021   LDLCALC 77 09/21/2021   TRIG 70 09/21/2021   CHOLHDL 2.1 09/21/2021   Lab Results  Component Value Date   NA 138 10/14/2021   K 3.6 10/14/2021   CREATININE 0.71 10/14/2021   GFRNONAA >60 10/14/2021   GLUCOSE 88 10/14/2021   TSH 3.530 11/09/2018     The 10-year ASCVD risk score (Arnett DK, et al., 2019) is: 9.2%  ---------------------------------------------------------------------------------------------------   Medications: Outpatient Medications Prior to Visit  Medication Sig   CALCIUM-VITAMIN D PO Take 600 mg by mouth in the morning and at bedtime.   cetirizine (ZYRTEC) 10 MG tablet Take 1 tablet (10 mg total) by mouth daily. (Patient taking differently: Take 10 mg by mouth as needed.)   conjugated estrogens (PREMARIN) vaginal cream PLACE [id number]  APPLICATORFULS VAGINALLY 2 (TWO) TIMES A WEEK. (Patient taking differently: as needed. PLACE [id number] APPLICATORFULS VAGINALLY 2 (TWO) TIMES A WEEK.)   cyanocobalamin 1000 MCG tablet Take by mouth.   losartan (COZAAR) 50 MG tablet Take 1 tablet (50 mg total) by mouth daily.   metoprolol succinate (TOPROL-XL) 50 MG 24 hr tablet Take 1 tablet (50 mg total) by mouth daily. Take with or immediately following a meal.   No facility-administered medications prior to visit.    Review of Systems  Constitutional:  Negative for appetite change and fever.  Eyes:  Negative for visual disturbance.  Respiratory:  Negative for chest tightness and shortness of breath.   Cardiovascular:  Negative for chest pain and leg swelling.  Neurological:  Negative for dizziness, light-headedness and headaches.       Objective    BP 132/81 (BP Location: Left Arm, Patient Position: Sitting, Cuff Size: Large)   Pulse 77   Temp 97.6 F (36.4 C) (Temporal)   Resp 12   Wt 138 lb 9.6 oz (62.9 kg)   BMI 23.06 kg/m  BP Readings from Last 3 Encounters:  03/25/22 132/81  01/15/22 136/70  11/23/21 115/75   Wt Readings from Last 3 Encounters:  03/25/22 138 lb 9.6 oz (62.9 kg)  01/15/22 136 lb (61.7 kg)  11/23/21 136 lb (61.7 kg)      Physical Exam Vitals reviewed.  Constitutional:      General: She is not in acute distress.    Appearance: Normal appearance.  She is well-developed. She is not diaphoretic.  HENT:     Head: Normocephalic and atraumatic.  Eyes:     General: No scleral icterus.    Conjunctiva/sclera: Conjunctivae normal.  Neck:     Thyroid: No thyromegaly.  Cardiovascular:     Rate and Rhythm: Normal rate and regular rhythm.     Heart sounds: Normal heart sounds. No murmur heard. Pulmonary:     Effort: Pulmonary effort is normal. No respiratory distress.     Breath sounds: Normal breath sounds. No wheezing, rhonchi or rales.  Musculoskeletal:     Cervical back: Neck supple.     Right lower  leg: No edema.     Left lower leg: No edema.  Lymphadenopathy:     Cervical: No cervical adenopathy.  Skin:    General: Skin is warm and dry.     Findings: No rash.  Neurological:     Mental Status: She is alert and oriented to person, place, and time. Mental status is at baseline.  Psychiatric:        Mood and Affect: Mood normal.        Behavior: Behavior normal.       No results found for any visits on 03/25/22.  Assessment & Plan     Problem List Items Addressed This Visit       Cardiovascular and Mediastinum   Hypertension - Primary    Well controlled on home readings Continue current medications Recheck metabolic panel F/u in 6 months         Other   Prediabetes    Recommend low carb diet Recheck A1c annually - patient declines today      Hyperlipidemia    Reviewed last lipid panel Not currently on a statin Recheck FLP and CMP annually  Discussed diet and exercise         Return in about 6 months (around 09/23/2022) for CPE.      I, Lavon Paganini, MD, have reviewed all documentation for this visit. The documentation on 03/25/22 for the exam, diagnosis, procedures, and orders are all accurate and complete.   Willet Schleifer, Dionne Bucy, MD, MPH [facility]

## 2022-03-25 NOTE — Assessment & Plan Note (Signed)
Reviewed last lipid panel °Not currently on a statin °Recheck FLP and CMP annually °Discussed diet and exercise  °

## 2022-03-25 NOTE — Assessment & Plan Note (Signed)
Recommend low carb diet Recheck A1c annually - patient declines today

## 2022-03-25 NOTE — Assessment & Plan Note (Signed)
Well controlled on home readings Continue current medications Recheck metabolic panel F/u in 6 months

## 2022-03-26 LAB — BASIC METABOLIC PANEL
BUN/Creatinine Ratio: 19 (ref 12–28)
BUN: 14 mg/dL (ref 8–27)
CO2: 24 mmol/L (ref 20–29)
Calcium: 9.6 mg/dL (ref 8.7–10.3)
Chloride: 99 mmol/L (ref 96–106)
Creatinine, Ser: 0.72 mg/dL (ref 0.57–1.00)
Glucose: 83 mg/dL (ref 70–99)
Potassium: 3.8 mmol/L (ref 3.5–5.2)
Sodium: 138 mmol/L (ref 134–144)
eGFR: 91 mL/min/{1.73_m2} (ref >59)

## 2022-04-26 DIAGNOSIS — H2513 Age-related nuclear cataract, bilateral: Secondary | ICD-10-CM | POA: Diagnosis not present

## 2022-04-26 DIAGNOSIS — H43813 Vitreous degeneration, bilateral: Secondary | ICD-10-CM | POA: Diagnosis not present

## 2022-04-26 DIAGNOSIS — Z0100 Encounter for examination of eyes and vision without abnormal findings: Secondary | ICD-10-CM | POA: Diagnosis not present

## 2022-05-05 DIAGNOSIS — H903 Sensorineural hearing loss, bilateral: Secondary | ICD-10-CM | POA: Diagnosis not present

## 2022-05-06 DIAGNOSIS — K080 Exfoliation of teeth due to systemic causes: Secondary | ICD-10-CM | POA: Diagnosis not present

## 2022-06-16 ENCOUNTER — Other Ambulatory Visit: Payer: Self-pay

## 2022-06-16 MED ORDER — LOSARTAN POTASSIUM 50 MG PO TABS: 50.0000 mg | ORAL_TABLET | Freq: Every day | ORAL | 1 refills | Status: DC

## 2022-06-16 NOTE — Telephone Encounter (Signed)
Requested Prescriptions   Signed Prescriptions Disp Refills   losartan (COZAAR) 50 MG tablet 90 tablet 1    Sig: Take 1 tablet (50 mg total) by mouth daily.    Authorizing Provider: Antonieta Iba    Ordering User: Guerry Minors

## 2022-06-18 DIAGNOSIS — Z0100 Encounter for examination of eyes and vision without abnormal findings: Secondary | ICD-10-CM | POA: Diagnosis not present

## 2022-07-02 ENCOUNTER — Ambulatory Visit: Payer: Self-pay | Admitting: *Deleted

## 2022-07-02 ENCOUNTER — Encounter: Payer: Self-pay | Admitting: Physician Assistant

## 2022-07-02 ENCOUNTER — Other Ambulatory Visit: Payer: Self-pay

## 2022-07-02 ENCOUNTER — Ambulatory Visit (INDEPENDENT_AMBULATORY_CARE_PROVIDER_SITE_OTHER): Payer: Medicare Other | Admitting: Physician Assistant

## 2022-07-02 VITALS — BP 140/68 | HR 86 | Temp 98.0°F | Ht 65.0 in | Wt 141.6 lb

## 2022-07-02 DIAGNOSIS — R21 Rash and other nonspecific skin eruption: Secondary | ICD-10-CM

## 2022-07-02 MED ORDER — FLUOCINONIDE EMULSIFIED BASE 0.05 % EX CREA: 1.0000 | TOPICAL_CREAM | Freq: Two times a day (BID) | CUTANEOUS | 1 refills | Status: DC

## 2022-07-02 MED ORDER — PREDNISONE 20 MG PO TABS: 20.0000 mg | ORAL_TABLET | Freq: Every day | ORAL | 0 refills | Status: DC

## 2022-07-02 NOTE — Progress Notes (Unsigned)
Established patient visit  Patient: Tracie Hall   DOB: [birth year]   67 y.o. Female  MRN: [id number] Visit Date: 07/02/2022  Today's healthcare provider: Debera Lat, PA-C   No chief complaint on file.  Subjective    HPI  ***     03/25/2022    9:54 AM 11/23/2021   11:02 AM 10/19/2021   10:18 AM  Depression screen PHQ 2/9  Decreased Interest 0 0 0  Down, Depressed, Hopeless 0 0 0  PHQ - 2 Score 0 0 0  Altered sleeping 0 0 0  Tired, decreased energy 0 0 0  Change in appetite 0 0 0  Feeling bad or failure about yourself  0 0 0  Trouble concentrating 0 0 0  Moving slowly or fidgety/restless 0 0 0  Suicidal thoughts 0 0 0  PHQ-9 Score 0 0 0  Difficult doing work/chores Not difficult at all Not difficult at all Not difficult at all       No data to display          Medications: Outpatient Medications Prior to Visit  Medication Sig   CALCIUM-VITAMIN D PO Take 600 mg by mouth in the morning and at bedtime.   cetirizine (ZYRTEC) 10 MG tablet Take 1 tablet (10 mg total) by mouth daily. (Patient taking differently: Take 10 mg by mouth as needed.)   conjugated estrogens (PREMARIN) vaginal cream PLACE 0.25 APPLICATORFULS VAGINALLY 2 (TWO) TIMES A WEEK. (Patient taking differently: as needed. PLACE 0.25 APPLICATORFULS VAGINALLY 2 (TWO) TIMES A WEEK.)   cyanocobalamin 1000 MCG tablet Take by mouth.   losartan (COZAAR) 50 MG tablet Take 1 tablet (50 mg total) by mouth daily.   metoprolol succinate (TOPROL-XL) 50 MG 24 hr tablet Take 1 tablet (50 mg total) by mouth daily. Take with or immediately following a meal.   No facility-administered medications prior to visit.    Review of Systems  All other systems reviewed and are negative.  Except see HPI   {Labs  Heme  Chem  Endocrine  Serology  Results Review (optional):23779}   Objective    There were no vitals taken for this visit. {Show previous vital signs (optional):23777}  Physical Exam Vitals reviewed.   Constitutional:      General: She is not in acute distress.    Appearance: Normal appearance. She is well-developed. She is not diaphoretic.  HENT:     Head: Normocephalic and atraumatic.  Eyes:     General: No scleral icterus.    Conjunctiva/sclera: Conjunctivae normal.  Neck:     Thyroid: No thyromegaly.  Cardiovascular:     Rate and Rhythm: Normal rate and regular rhythm.     Pulses: Normal pulses.     Heart sounds: Normal heart sounds. No murmur heard. Pulmonary:     Effort: Pulmonary effort is normal. No respiratory distress.     Breath sounds: Normal breath sounds. No wheezing, rhonchi or rales.  Musculoskeletal:     Cervical back: Neck supple.     Right lower leg: No edema.     Left lower leg: No edema.  Lymphadenopathy:     Cervical: No cervical adenopathy.  Skin:    General: Skin is warm and dry.     Findings: No rash.  Neurological:     Mental Status: She is alert and oriented to person, place, and time. Mental status is at baseline.  Psychiatric:        Mood and Affect: Mood normal.  Behavior: Behavior normal.      No results found for any visits on 07/02/22.  Assessment & Plan    ***  No follow-ups on file.     The patient was advised to call back or seek an in-person evaluation if the symptoms worsen or if the condition fails to improve as anticipated.  I discussed the assessment and treatment plan with the patient. The patient was provided an opportunity to ask questions and all were answered. The patient agreed with the plan and demonstrated an understanding of the instructions.  I, Debera Lat, PA-C have reviewed all documentation for this visit. The documentation on  07/02/22  for the exam, diagnosis, procedures, and orders are all accurate and complete.  Debera Lat, [facility], MMS [facility] [phone] (phone) [phone] (fax)  [facility]

## 2022-07-02 NOTE — Telephone Encounter (Signed)
  Chief Complaint: possible poison ivy on right arm Symptoms: Itching, blisters, red and puffy Frequency: Since Tues. Pertinent Negatives: Patient denies Benadryl and Calamine lotion helping much. Disposition: [] ED /[] Urgent Care (no appt availability in office) / [x] Appointment(In office/virtual)/ []  [facility]/ [] Home Care/ [] Refused Recommended Disposition /[] [facility]/ []  Follow-up with PCP Additional Notes: Appt. Made for today at 1:00 with Debera Lat, PA-C.

## 2022-07-02 NOTE — Telephone Encounter (Signed)
Message from Randol Kern sent at 07/02/2022  8:24 AM EDT  Summary: Swelling - possible poison ivy exposure   Pt has a rash that she believes is from being exposed to poison ivy, has had symptoms since Tuesday and is now experiencing swelling.  Best contact: [phone]          Call History   Type Contact Phone/Fax User  07/02/2022 08:20 AM EDT Phone (Incoming) Tracie Hall, Tracie Hall (Self) [phone] Judie Petit) Randol Kern   Reason for Disposition  MODERATE to SEVERE itching (e.g., interferes with work, school, sleep, or other activities)  Answer Assessment - Initial Assessment Questions 1. APPEARANCE of RASH: "Describe the rash."      I think I was exposed to poison ivy on Tues. Evening.   I was gardening.   2. LOCATION: "Where is the rash located?"  (e.g., face, genitals, hands, legs)     Up my arm and it's swollen.   I've taken some Benadryl and put Calamine lotion.    It's on my right arm. 3. SIZE: "How large is the rash?"      It does have some blisters 4. ONSET: "When did the rash begin?"      Tues. 5. ITCHING: "Does the rash itch?" If Yes, ask: "How bad is it?"   - MILD - doesn't interfere with normal activities   - MODERATE-SEVERE: interferes with work, school, sleep, or other activities      It's red and puffy, swelling.    It itches. 6. EXPOSURE:  "How were you exposed to the plant (poison ivy, poison oak, sumac)"  "When were you exposed?"       Yes  7. PAST HISTORY: "Have you had a poison ivy rash before?" If Yes, ask: "How bad was it?"     Not asked 8. PREGNANCY: "Is there any chance you are pregnant?" "When was your last menstrual period?"     N/A  Protocols used: Poison Ivy - Oak - [facility]

## 2022-07-04 ENCOUNTER — Encounter: Payer: Self-pay | Admitting: Physician Assistant

## 2022-07-07 ENCOUNTER — Ambulatory Visit: Payer: Self-pay

## 2022-07-07 MED ORDER — PREDNISONE 10 MG PO TABS: ORAL_TABLET | ORAL | 0 refills | Status: DC

## 2022-07-07 NOTE — Telephone Encounter (Signed)
  Chief Complaint: medication assistance Symptoms: rash on arm improving, blister drying up and redness decreasing in size Frequency:  Pertinent Negatives: NA Disposition: [] ED /[] Urgent Care (no appt availability in office) / [] Appointment(In office/virtual)/ []  [facility]/ [x] Home Care/ [] Refused Recommended Disposition /[] [place] [facility]/ []  Follow-up with PCP Additional Notes: answered pt's questions regarding prednisone. No further assistance needed.   Summary: RX question   Question regarding medication predniSONE (DELTASONE) 10 MG tablet [[id number]]         Reason for Disposition  Caller has medicine question only, adult not sick, AND triager answers question  Answer Assessment - Initial Assessment Questions 1. NAME of MEDICINE: "What medicine(s) are you calling about?"     prednisone 2. QUESTION: "What is your question?" (e.g., double dose of medicine, side effect)     Wanting to know if going from 20mg  BID to the taper dose was ok 3. PRESCRIBER: "Who prescribed the medicine?" Reason: if prescribed by specialist, call should be referred to that group.     Edmon Crape, PA 4. SYMPTOMS: "Do you have any symptoms?" If Yes, ask: "What symptoms are you having?"  "How bad are the symptoms (e.g., mild, moderate, severe)     Rash on arm improving  Protocols used: Medication Question Call-A-AH

## 2022-08-02 ENCOUNTER — Other Ambulatory Visit: Payer: Self-pay | Admitting: Family Medicine

## 2022-08-02 DIAGNOSIS — Z1231 Encounter for screening mammogram for malignant neoplasm of breast: Secondary | ICD-10-CM

## 2022-08-15 IMAGING — MG DIGITAL SCREENING BILAT W/ TOMO W/ CAD
8 series · 9 of 24 positions shown · non-contrast
Comparison: Previous exam(s).

CLINICAL DATA: Screening.

EXAM:
DIGITAL SCREENING BILATERAL MAMMOGRAM WITH TOMO AND CAD

[L MLO synth-2D]
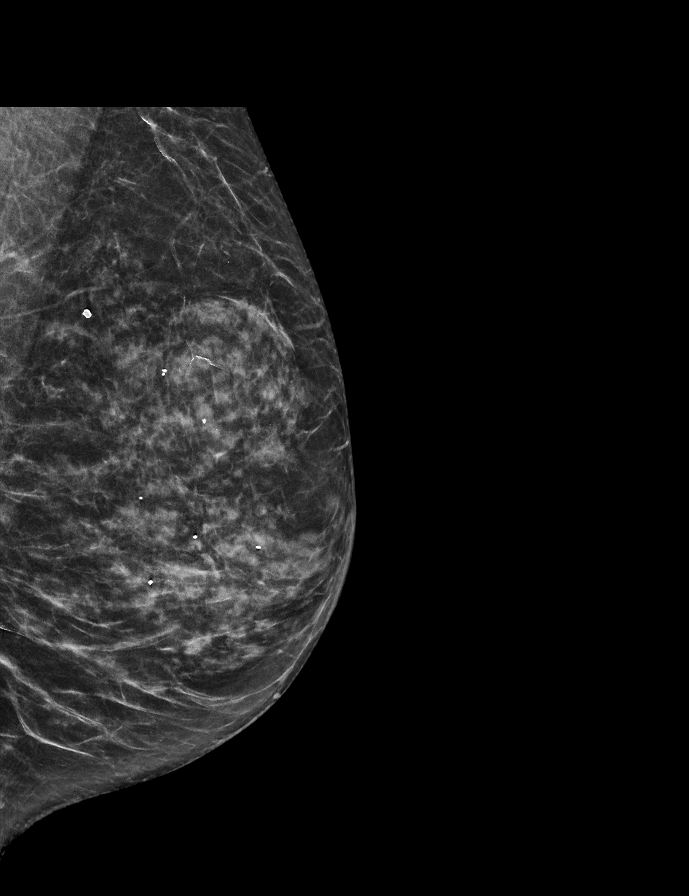

[R MLO synth-2D]
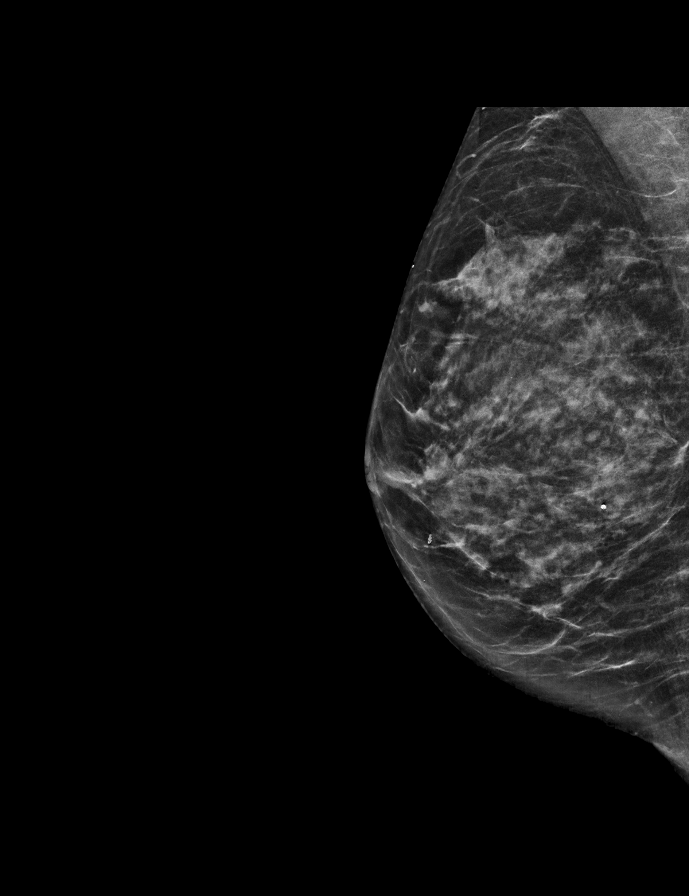

[R CC synth-2D]
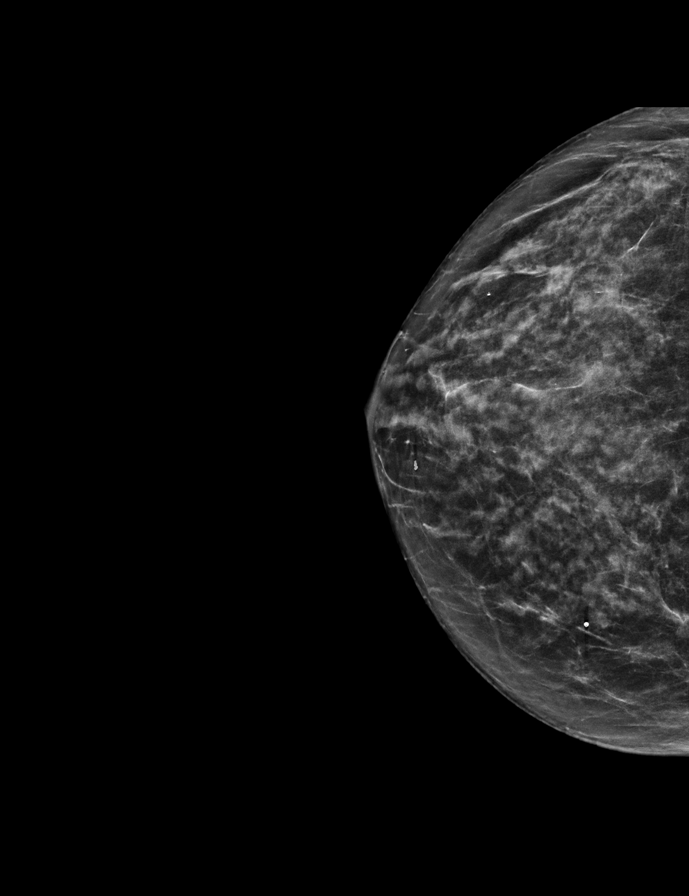

[L CC synth-2D]
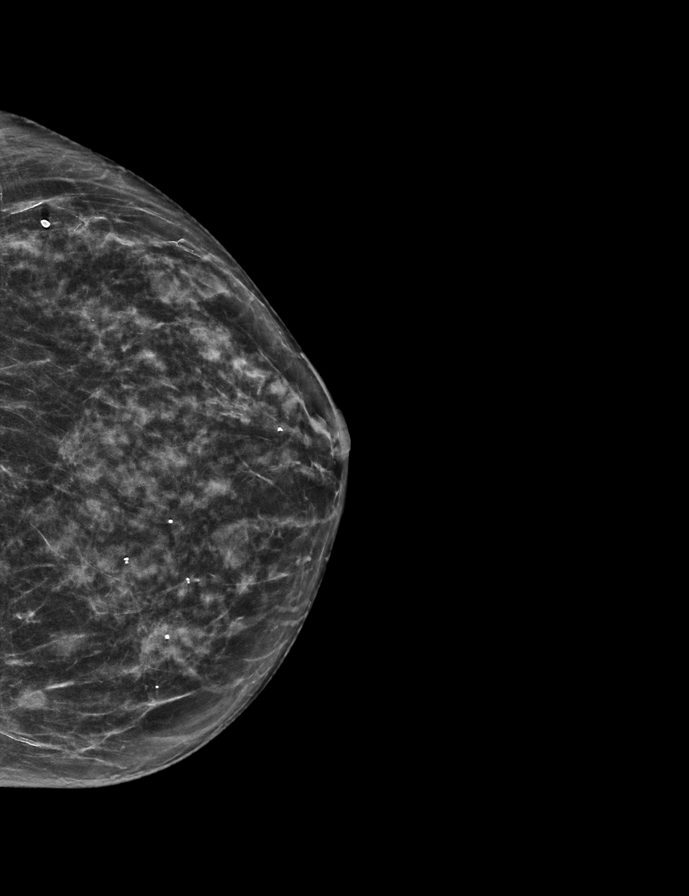

[R MLO tomo · 2 of 56 frames shown]
[frame 19/56]
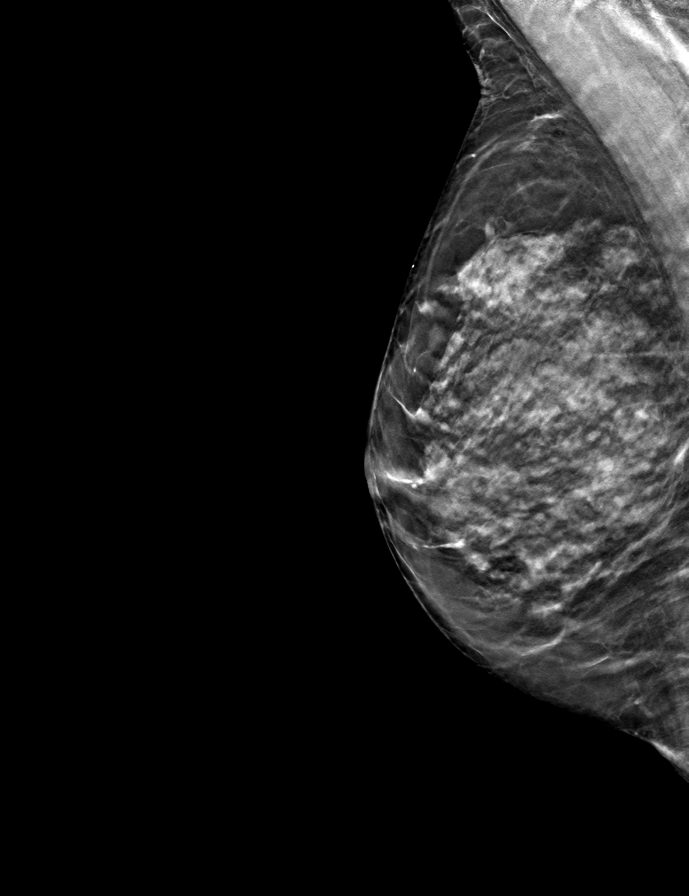
[frame 29/56]
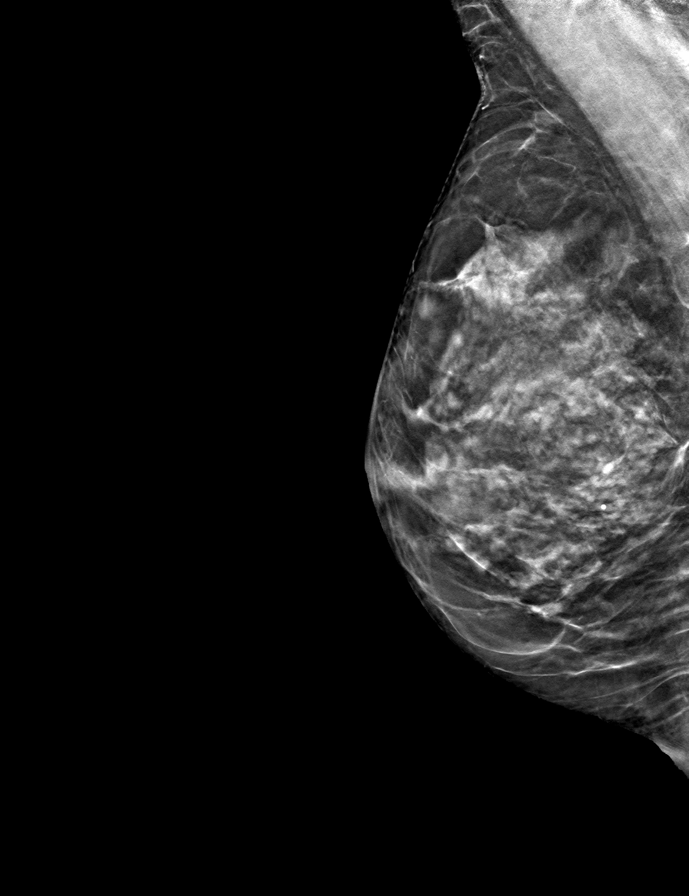

[L CC tomo · tomo slice 29/56.0]
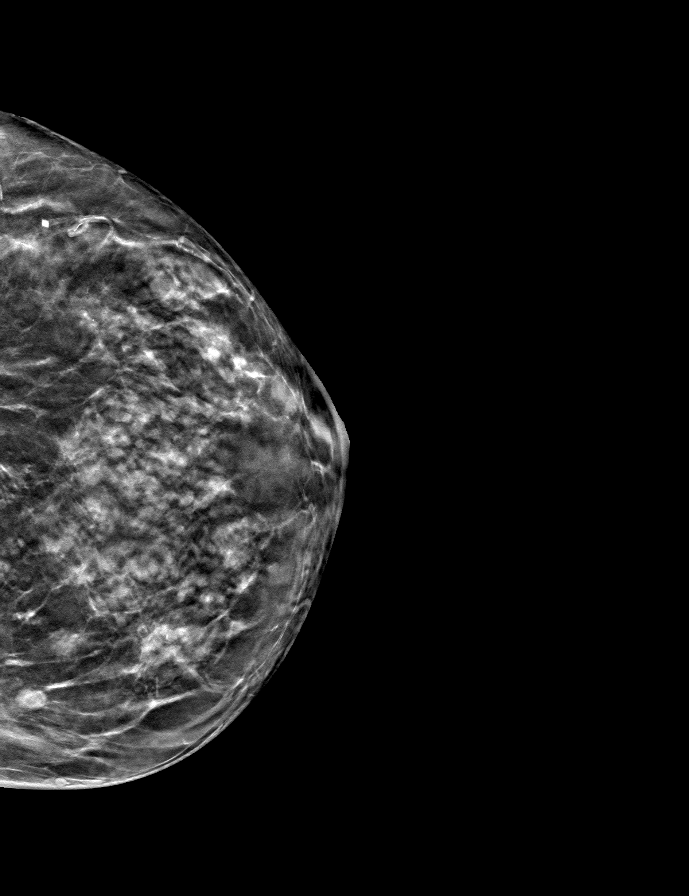

[R CC tomo · tomo slice 27/54.0]
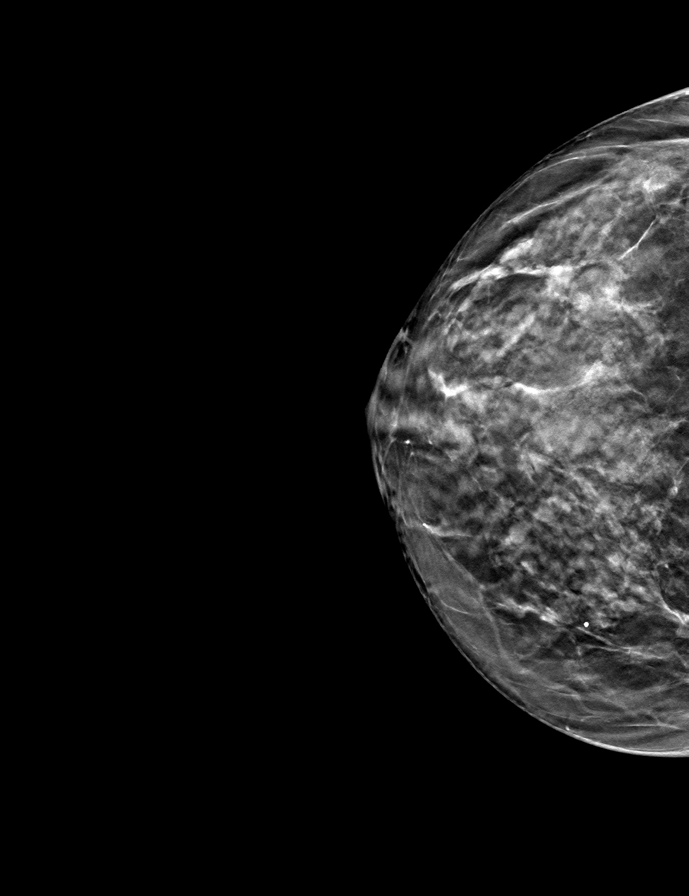

[L MLO tomo · tomo slice 27/52.0]
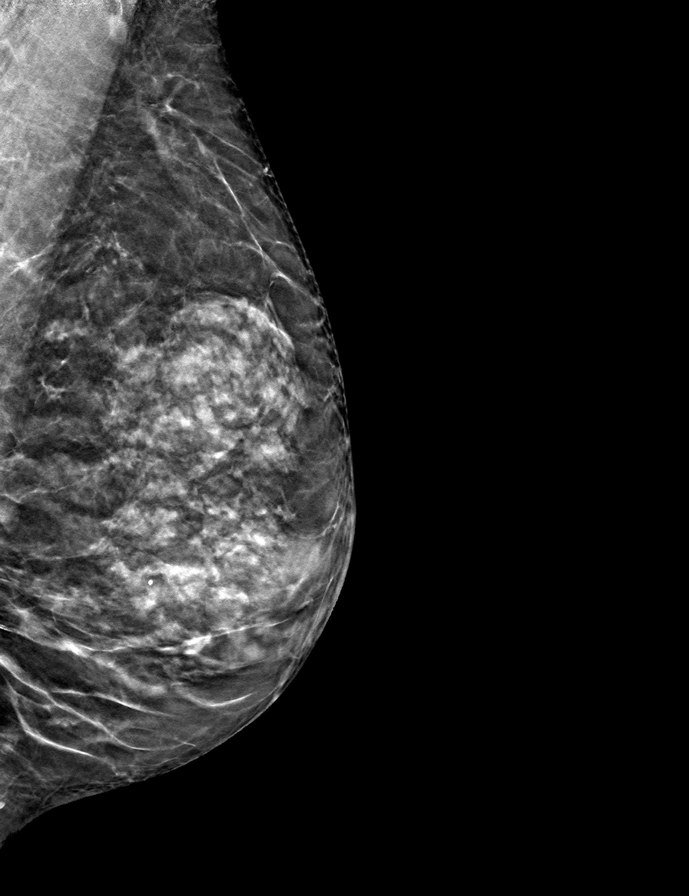

[9 of 24 positions shown; findings below may reference images not displayed]

ACR Breast Density Category c: The breast tissue is heterogeneously
dense, which may obscure small masses.
FINDINGS: There are no findings suspicious for malignancy. Images were
processed with CAD.
IMPRESSION: No mammographic evidence of malignancy. A result letter of this
screening mammogram will be mailed directly to the patient.

RECOMMENDATION:
Screening mammogram in one year. (Code:[id number])

BI-RADS CATEGORY  1: Negative.

## 2022-09-14 ENCOUNTER — Encounter: Payer: Self-pay | Admitting: Family Medicine

## 2022-09-14 ENCOUNTER — Ambulatory Visit: Payer: Medicare Other | Admitting: Family Medicine

## 2022-09-14 VITALS — BP 137/78 | HR 85 | Temp 97.8°F | Resp 12 | Ht 65.0 in | Wt 143.3 lb

## 2022-09-14 DIAGNOSIS — J069 Acute upper respiratory infection, unspecified: Secondary | ICD-10-CM | POA: Insufficient documentation

## 2022-09-14 NOTE — Assessment & Plan Note (Signed)
Improving  Due to symptom improvement over time likely viral, not bacterial  Encouraged using over the counter remedies for symptom relief  CTM symptoms, return if symptoms worsen

## 2022-09-14 NOTE — Progress Notes (Signed)
Established Patient Office Visit  Subjective   Patient ID: Tracie Hall, female    DOB: May 18, 1955  Age: 67 y.o. MRN: [id number]  Chief Complaint  Patient presents with   Sinusitis    Patient c/o drainage x 10 days.    Tracie Hall is here with congestion concerns. About 10 days ago, Tracie Hall got a sore throat for about 24 hours. It was painful and reminded her of the time Tracie Hall got Covid. Tracie Hall took an at home test, which was negative. After the sore throat resolved, Tracie Hall become congested. The congestion has been improving but has not fully resolved. Her snot was clear, but over the past couple of days became yellow / green which concerned her. Tracie Hall denies ear pain, fever, shortness of breath. Tracie Hall has had a cough and feelings pressure within her ear. Tracie Hall tried OTC cold / flu and zyrtec with no resolution of symptoms.   Review of Systems  Constitutional:  Negative for chills, fever and malaise/fatigue.  HENT:  Positive for sinus pain and sore throat. Negative for ear discharge and ear pain.   Respiratory:  Positive for cough. Negative for shortness of breath and wheezing.      Objective:     BP 137/78 (BP Location: Left Arm, Patient Position: Sitting, Cuff Size: Normal)   Pulse 85   Temp 97.8 F (36.6 C) (Temporal)   Resp 12   Ht 5\' 5"  (1.651 m)   Wt 143 lb 4.8 oz (65 kg)   SpO2 99%   BMI 23.85 kg/m    Physical Exam Constitutional:      Appearance: Normal appearance.  HENT:     Head: Normocephalic and atraumatic.     Right Ear: Tympanic membrane, ear canal and external ear normal.     Left Ear: Tympanic membrane, ear canal and external ear normal.     Nose: Congestion present.     Mouth/Throat:     Mouth: Mucous membranes are moist.     Pharynx: Oropharynx is clear.  Eyes:     Conjunctiva/sclera: Conjunctivae normal.     Pupils: Pupils are equal, round, and reactive to light.  Cardiovascular:     Rate and Rhythm: Normal rate and regular rhythm.     Heart sounds: Normal heart  sounds.  Pulmonary:     Effort: Pulmonary effort is normal.     Breath sounds: Normal breath sounds.  Neurological:     General: No focal deficit present.     Mental Status: Tracie Hall is alert and oriented to person, place, and time.     No results found for any visits on 09/14/22.   The 10-year ASCVD risk score (Arnett DK, et al., 2019) is: 9.9%    Assessment & Plan:   Problem List Items Addressed This Visit       Respiratory   Viral URI - Primary    Improving  Due to symptom improvement over time likely viral, not bacterial  Encouraged using over the counter remedies for symptom relief  CTM symptoms, return if symptoms worsen         Return if symptoms worsen or fail to improve.    Rometta Emery, Medical Student  Patient seen along with MS3 student Jodi Marble. I personally evaluated this patient along with the student, and verified all aspects of the history, physical exam, and medical decision making as documented by the student. I agree with the student's documentation and have made all necessary edits.  Erasmo Downer, MD,  [facility]

## 2022-10-06 DIAGNOSIS — D2261 Melanocytic nevi of right upper limb, including shoulder: Secondary | ICD-10-CM | POA: Diagnosis not present

## 2022-10-06 DIAGNOSIS — D2262 Melanocytic nevi of left upper limb, including shoulder: Secondary | ICD-10-CM | POA: Diagnosis not present

## 2022-10-06 DIAGNOSIS — L570 Actinic keratosis: Secondary | ICD-10-CM | POA: Diagnosis not present

## 2022-10-06 DIAGNOSIS — D225 Melanocytic nevi of trunk: Secondary | ICD-10-CM | POA: Diagnosis not present

## 2022-10-06 DIAGNOSIS — D2271 Melanocytic nevi of right lower limb, including hip: Secondary | ICD-10-CM | POA: Diagnosis not present

## 2022-10-12 ENCOUNTER — Ambulatory Visit (INDEPENDENT_AMBULATORY_CARE_PROVIDER_SITE_OTHER): Payer: Medicare Other

## 2022-10-12 VITALS — Ht 65.0 in | Wt 143.0 lb

## 2022-10-12 DIAGNOSIS — Z0000 Encounter for general adult medical examination without abnormal findings: Secondary | ICD-10-CM | POA: Diagnosis not present
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
## 2022-10-12 NOTE — Patient Instructions (Signed)
Tracie Hall , Thank you for taking time to come for your Medicare Wellness Visit. I appreciate your ongoing commitment to your health goals. Please review the following plan we discussed and let me know if I can assist you in the future.   Referrals/Orders/Follow-Ups/Clinician Recommendations: none  This is a list of the screening recommended for you and due dates:  Health Maintenance  Topic Date Due   COVID-19 Vaccine (5 - 2023-24 season) 09/26/2022   Flu Shot  04/25/2023*   Medicare Annual Wellness Visit  10/12/2023   Mammogram  11/14/2023   Colon Cancer Screening  07/01/2024   DTaP/Tdap/Td vaccine (4 - Td or Tdap) 10/24/2028   Pneumonia Vaccine  Completed   DEXA scan (bone density measurement)  Completed   Hepatitis C Screening  Completed   Zoster (Shingles) Vaccine  Completed   HPV Vaccine  Aged Out  *Topic was postponed. The date shown is not the original due date.    Advanced directives: (Copy Requested) Please bring a copy of your health care power of attorney and living will to the office to be added to your chart at your convenience.  Next Medicare Annual Wellness Visit scheduled for next year: Yes 10/17/23 @ 9:45am telephone

## 2022-10-14 ENCOUNTER — Ambulatory Visit (INDEPENDENT_AMBULATORY_CARE_PROVIDER_SITE_OTHER): Payer: Medicare Other | Admitting: Family Medicine

## 2022-10-14 ENCOUNTER — Encounter: Payer: Self-pay | Admitting: Family Medicine

## 2022-10-14 VITALS — BP 103/68 | HR 96 | Ht 65.0 in | Wt 139.8 lb

## 2022-10-14 DIAGNOSIS — E538 Deficiency of other specified B group vitamins: Secondary | ICD-10-CM

## 2022-10-14 DIAGNOSIS — E559 Vitamin D deficiency, unspecified: Secondary | ICD-10-CM | POA: Diagnosis not present

## 2022-10-14 DIAGNOSIS — I700 Atherosclerosis of aorta: Secondary | ICD-10-CM

## 2022-10-14 DIAGNOSIS — Z0000 Encounter for general adult medical examination without abnormal findings: Secondary | ICD-10-CM | POA: Diagnosis not present

## 2022-10-14 DIAGNOSIS — R0683 Snoring: Secondary | ICD-10-CM

## 2022-10-14 DIAGNOSIS — I10 Essential (primary) hypertension: Secondary | ICD-10-CM | POA: Diagnosis not present

## 2022-10-14 DIAGNOSIS — R7303 Prediabetes: Secondary | ICD-10-CM

## 2022-10-14 DIAGNOSIS — E782 Mixed hyperlipidemia: Secondary | ICD-10-CM

## 2022-10-14 MED ORDER — ONETOUCH ULTRA 2 W/DEVICE KIT
PACK | 0 refills | Status: AC
Start: 2022-10-14 — End: ?

## 2022-10-14 MED ORDER — ONETOUCH ULTRA VI STRP
ORAL_STRIP | 12 refills | Status: DC
Start: 2022-10-14 — End: 2023-10-20

## 2022-10-14 MED ORDER — ONETOUCH DELICA LANCETS 33G MISC
1 refills | Status: DC
Start: 2022-10-14 — End: 2023-04-29

## 2022-10-14 MED ORDER — METOPROLOL SUCCINATE ER 50 MG PO TB24
50.0000 mg | ORAL_TABLET | Freq: Every day | ORAL | 3 refills | Status: DC
Start: 2022-10-14 — End: 2023-10-06

## 2022-10-14 NOTE — Progress Notes (Signed)
Complete physical exam  Patient: Tracie Hall   DOB: Mar 16, 1955   67 y.o. Female  MRN: [id number]  Subjective:    Chief Complaint  Patient presents with   Annual Exam    Has son confidential questions to ask     Tracie Hall is a 67 y.o. female who presents today for a complete physical exam. She reports consuming a general diet.  She generally feels well. She reports sleeping well. She does have additional problems to discuss today.    Discussed the use of AI scribe software for clinical note transcription with the patient, who gave verbal consent to proceed.  History of Present Illness   The patient, with a history of prediabetes, hypertension, and arthritis, presents for a routine physical examination. They express concern about ridges on their nails, which they noticed recently. They also report discrepancies in their blood sugar readings when using different glucometers, causing them to question the accuracy of their readings.  The patient also mentions occasional discoloration in their feet when they hang down, which resolves upon rubbing. They have not noticed any associated swelling or other symptoms. They also report stiffness in their left knee, which they attribute to arthritis diagnosed previously. They express interest in trying turmeric supplements for potential relief.  The patient also reports occasional snoring, but denies symptoms of sleep apnea such as daytime fatigue or morning headaches. They are considering trying over-the-counter snore strips for potential relief.  The patient is currently on metoprolol and losartan for hypertension, and they express uncertainty about the need for continued metoprolol use. They also mention receiving a flu shot and are scheduled for a mammogram next month. They inquire about the need for a new pneumonia shot, having received the previous ones.       Most recent fall risk assessment:    10/05/2022   11:41 AM  Fall Risk    Falls in the past year? 0  Risk for fall due to : No Fall Risks  Follow up Education provided;Falls prevention discussed     Most recent depression screenings:    10/12/2022    9:44 AM 09/14/2022    1:17 PM  PHQ 2/9 Scores  PHQ - 2 Score 0 0  PHQ- 9 Score  0        Patient Care Team: Erasmo Downer, MD as PCP - General (Family Medicine) Lockie Mola, MD as Referring Physician (Ophthalmology)   Outpatient Medications Prior to Visit  Medication Sig   CALCIUM-VITAMIN D PO Take 600 mg by mouth in the morning and at bedtime.   cetirizine (ZYRTEC) 10 MG tablet Take 1 tablet (10 mg total) by mouth daily. (Patient taking differently: Take 10 mg by mouth as needed.)   conjugated estrogens (PREMARIN) vaginal cream PLACE 0.25 APPLICATORFULS VAGINALLY 2 (TWO) TIMES A WEEK.   cyanocobalamin 1000 MCG tablet Take 1,000 mcg by mouth 3 (three) times a week.   [DISCONTINUED] metoprolol succinate (TOPROL-XL) 50 MG 24 hr tablet Take 1 tablet (50 mg total) by mouth daily. Take with or immediately following a meal.   losartan (COZAAR) 50 MG tablet Take 1 tablet (50 mg total) by mouth daily.   No facility-administered medications prior to visit.    ROS per HPI      Objective:     BP 103/68 Comment: home reading  Pulse 96   Ht 5\' 5"  (1.651 m)   Wt 139 lb 12.8 oz (63.4 kg)   SpO2 100%   BMI  23.26 kg/m    Physical Exam Vitals reviewed.  Constitutional:      General: She is not in acute distress.    Appearance: Normal appearance. She is well-developed. She is not diaphoretic.  HENT:     Head: Normocephalic and atraumatic.     Right Ear: Tympanic membrane, ear canal and external ear normal.     Left Ear: Tympanic membrane, ear canal and external ear normal.     Nose: Nose normal.     Mouth/Throat:     Mouth: Mucous membranes are moist.     Pharynx: Oropharynx is clear. No oropharyngeal exudate.  Eyes:     General: No scleral icterus.    Conjunctiva/sclera:  Conjunctivae normal.     Pupils: Pupils are equal, round, and reactive to light.  Neck:     Thyroid: No thyromegaly.  Cardiovascular:     Rate and Rhythm: Normal rate and regular rhythm.     Pulses: Normal pulses.     Heart sounds: Normal heart sounds. No murmur heard. Pulmonary:     Effort: Pulmonary effort is normal. No respiratory distress.     Breath sounds: Normal breath sounds. No wheezing or rales.  Abdominal:     General: There is no distension.     Palpations: Abdomen is soft.     Tenderness: There is no abdominal tenderness.  Musculoskeletal:        General: No deformity.     Cervical back: Neck supple.     Right lower leg: No edema.     Left lower leg: No edema.  Lymphadenopathy:     Cervical: No cervical adenopathy.  Skin:    General: Skin is warm and dry.     Findings: No rash.  Neurological:     Mental Status: She is alert and oriented to person, place, and time. Mental status is at baseline.     Gait: Gait normal.  Psychiatric:        Mood and Affect: Mood normal.        Behavior: Behavior normal.        Thought Content: Thought content normal.      No results found for any visits on 10/14/22.     Assessment & Plan:    Routine Health Maintenance and Physical Exam  Immunization History  Administered Date(s) Administered   Fluad Quad(high Dose 65+) 10/22/2020, 11/17/2021   Fluad Trivalent(High Dose 65+) 10/14/2022   Influenza, High Dose Seasonal PF 10/03/2019   Influenza,inj,Quad PF,6+ Mos 10/20/2017, 10/25/2018   Influenza-Unspecified 10/21/2015, 10/18/2016, 10/20/2017, 10/25/2018   PFIZER(Purple Top)SARS-COV-2 Vaccination 02/14/2019, 03/05/2019, 12/04/2019   Pfizer Covid-19 Vaccine Bivalent Booster 81yrs & up 11/25/2020   Pneumococcal Conjugate-13 04/26/2019   Pneumococcal Polysaccharide-23 09/16/2020   Td 10/25/2018   Td (Adult), 2 Lf Tetanus Toxid, Preservative Free 10/25/2018   Tdap 05/29/2008   Zoster Recombinant(Shingrix) 10/03/2019,  01/30/2020   Zoster, Live 11/12/2014    Health Maintenance  Topic Date Due   COVID-19 Vaccine (5 - 2023-24 season) 04/25/2023 (Originally 09/26/2022)   Medicare Annual Wellness (AWV)  10/12/2023   MAMMOGRAM  11/14/2023   Colonoscopy  07/01/2024   DTaP/Tdap/Td (4 - Td or Tdap) 10/24/2028   Pneumonia Vaccine 34+ Years old  Completed   INFLUENZA VACCINE  Completed   DEXA SCAN  Completed   Hepatitis C Screening  Completed   Zoster Vaccines- Shingrix  Completed   HPV VACCINES  Aged Out    Discussed health benefits of physical activity, and encouraged her to  engage in regular exercise appropriate for her age and condition.  Problem List Items Addressed This Visit       Cardiovascular and Mediastinum   Hypertension    Blood pressure well controlled on current regimen of losartan and metoprolol. -Continue losartan and metoprolol. -Refill metoprolol prescription.      Relevant Medications   metoprolol succinate (TOPROL-XL) 50 MG 24 hr tablet   Other Relevant Orders   Comprehensive metabolic panel   J47   VITAMIN D 25 Hydroxy (Vit-D Deficiency, Fractures)   Aortic atherosclerosis (HCC)    Continue risk factor management      Relevant Medications   metoprolol succinate (TOPROL-XL) 50 MG 24 hr tablet     Other   Avitaminosis D    Continue supplement Recheck level       Relevant Orders   VITAMIN D 25 Hydroxy (Vit-D Deficiency, Fractures)   Vitamin B 12 deficiency    Continue supplement Recheck level       Relevant Orders   B12   Prediabetes    Noted variance in home glucose monitoring. Discussed obtaining a more reliable glucometer. -Order OneTouch glucometer and test strips. -Check blood sugar as needed, not necessarily daily.      Relevant Medications   OneTouch Delica Lancets 33G MISC   Blood Glucose Monitoring Suppl (ONE TOUCH ULTRA 2) w/Device KIT   glucose blood (ONETOUCH ULTRA) test strip   Other Relevant Orders   Hemoglobin A1c   Hyperlipidemia     Reviewed last lipid panel Not currently on a statin Recheck FLP and CMP Discussed diet and exercise       Relevant Medications   metoprolol succinate (TOPROL-XL) 50 MG 24 hr tablet   Other Relevant Orders   Comprehensive metabolic panel   Lipid panel   Other Visit Diagnoses     Encounter for annual physical exam    -  Primary   Relevant Orders   Comprehensive metabolic panel   Lipid panel   Hemoglobin A1c   Flu Vaccine Trivalent High Dose (Fluad) (Completed)   Snoring       Essential hypertension       Relevant Medications   metoprolol succinate (TOPROL-XL) 50 MG 24 hr tablet           Lower extremity discoloration Noted color changes in feet when hanging down, resolves with rubbing. Likely due to blood pooling (venous insufficiency), no associated swelling. -Continue to monitor for any changes or development of swelling.  Arthritis in left knee Noted stiffness and difficulty bending knee at times. Discussed potential benefit of turmeric for inflammation. -Consider trial of turmeric supplement.  Snoring Occasional snoring without symptoms of sleep apnea such as daytime fatigue or morning headaches. -Consider trial of over-the-counter breathe right strips.  General Health Maintenance -Complete lab work today including cholesterol, kidney and liver function, A1c, vitamin D and B12. -Scheduled mammogram next month. -Received flu shot today, consider taking Zyrtec for arm swelling if needed. -Continue regular dental and eye exams. -Follow-up in 6 months for blood pressure check.       Return in about 6 months (around 04/13/2023) for chronic disease f/u.     Shirlee Latch, MD

## 2022-10-14 NOTE — Assessment & Plan Note (Signed)
Blood pressure well controlled on current regimen of losartan and metoprolol. -Continue losartan and metoprolol. -Refill metoprolol prescription.

## 2022-10-14 NOTE — Assessment & Plan Note (Signed)
Continue supplement Recheck level 

## 2022-10-14 NOTE — Assessment & Plan Note (Signed)
Reviewed last lipid panel Not currently on a statin Recheck FLP and CMP Discussed diet and exercise

## 2022-10-14 NOTE — Assessment & Plan Note (Signed)
Continue risk factor management.

## 2022-10-14 NOTE — Assessment & Plan Note (Signed)
Continue supplement Recheck level

## 2022-10-14 NOTE — Assessment & Plan Note (Signed)
Noted variance in home glucose monitoring. Discussed obtaining a more reliable glucometer. -Order OneTouch glucometer and test strips. -Check blood sugar as needed, not necessarily daily.

## 2022-10-15 LAB — HEMOGLOBIN A1C
Est. average glucose Bld gHb Est-mCnc: 120 mg/dL
Hgb A1c MFr Bld: 5.8 % — ABNORMAL HIGH (ref 4.8–5.6)

## 2022-10-15 LAB — LIPID PANEL
Chol/HDL Ratio: 2.4 ratio (ref 0.0–4.4)
Cholesterol, Total: 201 mg/dL — ABNORMAL HIGH (ref 100–199)
HDL: 85 mg/dL (ref >39)
LDL Chol Calc (NIH): 103 mg/dL — ABNORMAL HIGH (ref 0–99)
Triglycerides: 71 mg/dL (ref 0–149)
VLDL Cholesterol Cal: 13 mg/dL (ref 5–40)

## 2022-10-15 LAB — COMPREHENSIVE METABOLIC PANEL
ALT: 14 IU/L (ref 0–32)
AST: 21 IU/L (ref 0–40)
Albumin: 4.5 g/dL (ref 3.9–4.9)
Alkaline Phosphatase: 70 IU/L (ref 44–121)
BUN/Creatinine Ratio: 22 (ref 12–28)
BUN: 18 mg/dL (ref 8–27)
Bilirubin Total: 0.8 mg/dL (ref 0.0–1.2)
CO2: 26 mmol/L (ref 20–29)
Calcium: 10 mg/dL (ref 8.7–10.3)
Chloride: 100 mmol/L (ref 96–106)
Creatinine, Ser: 0.81 mg/dL (ref 0.57–1.00)
Globulin, Total: 2.4 g/dL (ref 1.5–4.5)
Glucose: 106 mg/dL — ABNORMAL HIGH (ref 70–99)
Potassium: 4.1 mmol/L (ref 3.5–5.2)
Sodium: 141 mmol/L (ref 134–144)
Total Protein: 6.9 g/dL (ref 6.0–8.5)
eGFR: 79 mL/min/{1.73_m2} (ref >59)

## 2022-10-15 LAB — VITAMIN B12: Vitamin B-12: 1144 pg/mL (ref 232–1245)

## 2022-10-15 LAB — VITAMIN D 25 HYDROXY (VIT D DEFICIENCY, FRACTURES): Vit D, 25-Hydroxy: 47.2 ng/mL (ref 30.0–100.0)

## 2022-11-16 DIAGNOSIS — K080 Exfoliation of teeth due to systemic causes: Secondary | ICD-10-CM | POA: Diagnosis not present

## 2022-11-17 ENCOUNTER — Ambulatory Visit
Admission: RE | Admit: 2022-11-17 | Discharge: 2022-11-17 | Disposition: A | Discharge status: Home / Self Care | Payer: Medicare Other | Source: Ambulatory Visit | Attending: Family Medicine

## 2022-11-17 DIAGNOSIS — Z1231 Encounter for screening mammogram for malignant neoplasm of breast: Secondary | ICD-10-CM | POA: Diagnosis not present
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
## 2023-01-04 ENCOUNTER — Ambulatory Visit: Payer: Medicare Other | Attending: Cardiovascular Disease | Admitting: Cardiovascular Disease

## 2023-01-04 ENCOUNTER — Encounter: Payer: Self-pay | Admitting: Cardiovascular Disease

## 2023-01-04 VITALS — BP 160/70 | HR 69 | Ht 65.0 in | Wt 138.5 lb

## 2023-01-04 DIAGNOSIS — I10 Essential (primary) hypertension: Secondary | ICD-10-CM

## 2023-01-04 DIAGNOSIS — R7303 Prediabetes: Secondary | ICD-10-CM

## 2023-01-04 DIAGNOSIS — R002 Palpitations: Secondary | ICD-10-CM | POA: Diagnosis not present

## 2023-01-04 DIAGNOSIS — I700 Atherosclerosis of aorta: Secondary | ICD-10-CM | POA: Diagnosis not present

## 2023-01-04 DIAGNOSIS — E782 Mixed hyperlipidemia: Secondary | ICD-10-CM

## 2023-01-04 MED ORDER — LOSARTAN POTASSIUM 50 MG PO TABS: 50.0000 mg | ORAL_TABLET | Freq: Every day | ORAL | 3 refills | Status: DC

## 2023-01-04 NOTE — Patient Instructions (Signed)

## 2023-01-12 ENCOUNTER — Ambulatory Visit: Payer: Medicare Other | Admitting: Dermatology

## 2023-01-14 ENCOUNTER — Encounter: Payer: Self-pay | Admitting: Cardiovascular Disease

## 2023-02-03 ENCOUNTER — Encounter: Payer: Self-pay | Admitting: Family Medicine

## 2023-02-03 ENCOUNTER — Ambulatory Visit (INDEPENDENT_AMBULATORY_CARE_PROVIDER_SITE_OTHER): Payer: Medicare Other | Admitting: Family Medicine

## 2023-02-03 VITALS — BP 117/76 | HR 89 | Ht 65.0 in | Wt 136.9 lb

## 2023-02-03 DIAGNOSIS — K1370 Unspecified lesions of oral mucosa: Secondary | ICD-10-CM

## 2023-02-03 DIAGNOSIS — M270 Developmental disorders of jaws: Secondary | ICD-10-CM | POA: Diagnosis not present

## 2023-02-03 DIAGNOSIS — R682 Dry mouth, unspecified: Secondary | ICD-10-CM

## 2023-02-03 MED ORDER — NYSTATIN 100000 UNIT/ML MT SUSP: 5.0000 mL | Freq: Four times a day (QID) | OROMUCOSAL | 0 refills | Status: DC | PRN

## 2023-02-03 NOTE — Progress Notes (Signed)
 Acute visit   Patient: Tracie Hall   DOB: [birth year]   68 y.o. Female  MRN: [id number]  Chief Complaint  Patient presents with   Edema    Tongue swelling X 1 week. Reports on and off. She reports it first started when she was having sensitivity on a tooth and she tried to get something from out of her tooth using her tongue and after that the part under her tongue felt sore as if it was bruised and from there her tongue has been swollen. She reports her ability to eat has gone down as she is consuming a soft diet to prevent soreness   Subjective    Discussed the use of AI scribe software for clinical note transcription with the patient, who gave verbal consent to proceed.  History of Present Illness   The patient, with a history of large mandibular tori, presents with oral discomfort that has been ongoing for a few weeks. The discomfort is described as a pressure sensation, particularly when biting down, and sensitivity under the tongue. The patient also reports a change in voice quality, which they attribute to a feeling of tongue swelling. The patient has noticed a change in the color of their tongue, describing it as a brownish color. The patient denies any fever, vomiting, sores in the mouth, or night sweats. The patient reports difficulty eating certain foods due to the discomfort and has resorted to a soft food diet. The patient also mentions occasional snoring, but denies any other symptoms of sleep apnea.        Review of Systems  Objective    BP 117/76 Comment: home reading  Pulse 89   Ht 5' 5 (1.651 m)   Wt 136 lb 14.4 oz (62.1 kg)   SpO2 100%   BMI 22.78 kg/m  Physical Exam    Physical Exam   HEENT: Tongue with brownish discoloration on dorsal surface. Large mandibular tori present, limiting space for tongue. Partial tear of lingual frenulum. Gums and teeth without abnormalities. Throat without swelling or abnormalities. Lymph nodes not palpable.       No  results found for any visits on 02/03/23.  Assessment & Plan     Problem List Items Addressed This Visit   None Visit Diagnoses       Torus mandibularis    -  Primary     Dry mouth         Oral lesion           Assessment and Plan    Mandibular Tori Discomfort and sensitivity in the oral cavity, particularly around the tongue and frenulum. Large mandibular tori may be causing irritation, discomfort, and contributing to recent voice change and dry mouth. Discussed potential benefits and risks of tori removal, including increased space for the tongue, reduced dry mouth, and snoring, versus surgical complications and tori regrowth. Alternative management without surgery discussed. - Refer to oral surgeon for evaluation and potential removal of mandibular tori - Consider sleep study  Torn Frenulum Partial tear of the frenulum at the base of the tongue, likely due to mechanical trauma. Causing pain and sensitivity, especially during tongue movement or eating. Expected to heal within 1-2 weeks. - Prescribe magic mouthwash (lidocaine  and mouthwash) to be used up to four times a day, especially before eating or talking - Advise avoiding activities that may exacerbate the tear - Expect healing within 1-2 weeks  Dry Mouth Symptoms of dry mouth, including thick  saliva and a cottony voice. Potentially exacerbated by medications, dehydration, or dry air. Discussed benefits of using a humidifier and Biotene products after frenulum heals. - Recommend using a humidifier in the bedroom - Consider using Biotene products (moisturizing rinse, spray, or lozenges) after the frenulum heals  General Health Maintenance Actively managing health with regular exercise and blood pressure monitoring. Recently joined a gym and working on improving cholesterol levels. - Continue regular exercise regimen - Monitor blood pressure at home and bring readings to next appointment - Follow up on cholesterol levels at  next visit in March         Meds ordered this encounter  Medications   magic mouthwash (nystatin , lidocaine , diphenhydrAMINE, alum & mag hydroxide) suspension    Sig: Swish and spit 5 mLs 4 (four) times daily as needed for mouth pain.    Dispense:  180 mL    Refill:  0     Return if symptoms worsen or fail to improve.      Jon Eva, MD  [facility] [phone] (phone) [phone] (fax)  [facility]

## 2023-02-10 ENCOUNTER — Encounter: Payer: Self-pay | Admitting: Family Medicine

## 2023-02-22 DIAGNOSIS — Z1331 Encounter for screening for depression: Secondary | ICD-10-CM | POA: Diagnosis not present

## 2023-02-22 DIAGNOSIS — N898 Other specified noninflammatory disorders of vagina: Secondary | ICD-10-CM | POA: Diagnosis not present

## 2023-02-22 DIAGNOSIS — Z01411 Encounter for gynecological examination (general) (routine) with abnormal findings: Secondary | ICD-10-CM | POA: Diagnosis not present

## 2023-02-22 DIAGNOSIS — Z01419 Encounter for gynecological examination (general) (routine) without abnormal findings: Secondary | ICD-10-CM | POA: Diagnosis not present

## 2023-02-25 ENCOUNTER — Telehealth: Payer: Self-pay | Admitting: Cardiovascular Disease

## 2023-02-25 NOTE — Telephone Encounter (Signed)
STAT if HR is under 50 or over 120 (normal HR is 60-100 beats per minute)  What is your heart rate? 107  Do you have a log of your heart rate readings (document readings)? yes  Do you have any other symptoms? Blood pressure fluctuation

## 2023-02-25 NOTE — Telephone Encounter (Signed)
Patient here in office. Patient with concerns about blood pressure. Patient with log of recent blood pressures. Patient asking if she should take extra medication when her blood pressure is elevated. Blood pressure log given to Dr. Mariah Milling for review.

## 2023-02-25 NOTE — Telephone Encounter (Signed)
Called and spoke with patient, Notified of the following from Dr. Mariah Milling.  Blood pressures reviewed, they look okay  Anxiety/stress can cause blood pressure to jump  Average of her pressures look good  Thx  TGollan   Patient verbalizes understanding.

## 2023-03-03 ENCOUNTER — Encounter: Payer: Self-pay | Admitting: Family Medicine

## 2023-03-03 ENCOUNTER — Ambulatory Visit (INDEPENDENT_AMBULATORY_CARE_PROVIDER_SITE_OTHER): Payer: Medicare Other | Admitting: Family Medicine

## 2023-03-03 VITALS — BP 147/76 | HR 76 | Temp 99.0°F | Ht 65.0 in | Wt 133.8 lb

## 2023-03-03 DIAGNOSIS — R058 Other specified cough: Secondary | ICD-10-CM

## 2023-03-03 MED ORDER — BENZONATATE 100 MG PO CAPS: 100.0000 mg | ORAL_CAPSULE | Freq: Two times a day (BID) | ORAL | 0 refills | Status: DC | PRN

## 2023-03-03 NOTE — Progress Notes (Signed)
 Acute visit   Patient: Tracie Hall   DOB: 16-Feb-1955   68 y.o. Female  MRN: [id number]  Chief Complaint  Patient presents with   Cough    Cough associated with congestion X 1 week and a half. Reports husband recently had sinus infection. Minor phlegm to come up with yellowish tint. She reports to be taking [facility] cough and cold (like coricidin). Reports she has oral surgery next week and just wants to be sure everything is good so that she is able to go through with it.    Subjective    Discussed the use of AI scribe software for clinical note transcription with the patient, who gave verbal consent to proceed.  History of Present Illness   The patient, with a history of high blood pressure, presents with a minor cough that has been ongoing for a week and a half. The cough is not severe and the patient does not report any other respiratory symptoms such as shortness of breath or chest pain. The patient has been managing the cough with over-the-counter medication from [facility], specifically a version of Coricidin. The patient reports a slight stomach ache after coughing but does not report any other gastrointestinal symptoms. The patient is due to have oral surgery next week and is concerned about her current health status in relation to the upcoming procedure.       Review of Systems  Objective    BP (!) 147/76 (BP Location: Right Arm, Patient Position: Sitting, Cuff Size: Normal)   Pulse 76   Temp 99 F (37.2 C) (Oral)   Ht 5' 5 (1.651 m)   Wt 133 lb 12.8 oz (60.7 kg)   SpO2 100%   BMI 22.27 kg/m  Physical Exam Vitals reviewed.  Constitutional:      General: She is not in acute distress.    Appearance: Normal appearance. She is well-developed. She is not diaphoretic.  HENT:     Head: Normocephalic and atraumatic.     Right Ear: Tympanic membrane, ear canal and external ear normal.     Left Ear: Tympanic membrane, ear canal and external ear normal.     Nose:  Nose normal. No congestion.     Mouth/Throat:     Mouth: Mucous membranes are moist.     Pharynx: No oropharyngeal exudate.  Eyes:     General: No scleral icterus.    Conjunctiva/sclera: Conjunctivae normal.  Neck:     Thyroid: No thyromegaly.  Cardiovascular:     Rate and Rhythm: Normal rate and regular rhythm.     Heart sounds: Normal heart sounds. No murmur heard. Pulmonary:     Effort: Pulmonary effort is normal. No respiratory distress.     Breath sounds: Normal breath sounds. No wheezing, rhonchi or rales.  Abdominal:     General: There is no distension.     Palpations: Abdomen is soft.     Tenderness: There is no abdominal tenderness.  Musculoskeletal:     Cervical back: Neck supple.     Right lower leg: No edema.     Left lower leg: No edema.  Lymphadenopathy:     Cervical: No cervical adenopathy.  Skin:    General: Skin is warm and dry.  Neurological:     Mental Status: She is alert. Mental status is at baseline.       No results found for any visits on 03/03/23.  Assessment & Plan     Problem List Items  Addressed This Visit   None Visit Diagnoses       Post-viral cough syndrome    -  Primary          Post-viral cough syndrome Intermittent cough persisting for 1.5 weeks post-viral illness. No signs of pneumonia or bronchitis. Likely due to prolonged lung inflammation. Discussed impact on upcoming oral surgery and explained that the cough can persist for 1-4 weeks but should improve gradually. Tessalon  pearls and honey are effective and safe for symptomatic relief given her hypertension. - Prescribe Tessalon  pearls (swallow, up to twice a day as needed) - Recommend honey (honey cough drops, honey in tea, or a scoop of honey) for symptomatic relief - Advise that the cough may persist for 1-4 weeks but should gradually improve - Reassure that the cough should not necessitate postponing oral surgery       Meds ordered this encounter  Medications    benzonatate  (TESSALON ) 100 MG capsule    Sig: Take 1 capsule (100 mg total) by mouth 2 (two) times daily as needed for cough.    Dispense:  20 capsule    Refill:  0     Return if symptoms worsen or fail to improve.      Jon Eva, MD  [facility] [phone] (phone) [phone] (fax)  [facility]

## 2023-03-05 ENCOUNTER — Other Ambulatory Visit: Payer: Self-pay | Admitting: Cardiovascular Disease

## 2023-03-10 DIAGNOSIS — K080 Exfoliation of teeth due to systemic causes: Secondary | ICD-10-CM | POA: Diagnosis not present

## 2023-04-05 DIAGNOSIS — K080 Exfoliation of teeth due to systemic causes: Secondary | ICD-10-CM | POA: Diagnosis not present

## 2023-04-06 DIAGNOSIS — L814 Other melanin hyperpigmentation: Secondary | ICD-10-CM | POA: Diagnosis not present

## 2023-04-06 DIAGNOSIS — L821 Other seborrheic keratosis: Secondary | ICD-10-CM | POA: Diagnosis not present

## 2023-04-14 ENCOUNTER — Ambulatory Visit: Payer: Medicare Other | Admitting: Family Medicine

## 2023-04-18 DIAGNOSIS — K080 Exfoliation of teeth due to systemic causes: Secondary | ICD-10-CM | POA: Diagnosis not present

## 2023-04-21 ENCOUNTER — Ambulatory Visit: Payer: Self-pay | Admitting: Family Medicine

## 2023-04-28 ENCOUNTER — Other Ambulatory Visit: Payer: Self-pay | Admitting: Family Medicine

## 2023-04-28 DIAGNOSIS — R7303 Prediabetes: Secondary | ICD-10-CM

## 2023-04-29 NOTE — Telephone Encounter (Signed)
 Requested Prescriptions  Pending Prescriptions Disp Refills   Lancets (ONETOUCH DELICA PLUS LANCET33G) MISC [Pharmacy Med Name: ONE TOUCH DELICA PLUS 33G LANCETS] 100 each 1    Sig: USE TO CHECK BLOOD GLUCOSE EVERY DAY     Endocrinology: Diabetes - Testing Supplies Passed - 04/29/2023 10:02 AM      Passed - Valid encounter within last 12 months    Recent Outpatient Visits           1 month ago Post-viral cough syndrome   [facility] [place], Marzella Schlein, MD       Future Appointments             In 4 days Bacigalupo, Marzella Schlein, MD [facility]   In 5 months Bacigalupo, Marzella Schlein, MD [facility]

## 2023-05-03 ENCOUNTER — Encounter: Payer: Self-pay | Admitting: Family Medicine

## 2023-05-03 ENCOUNTER — Ambulatory Visit (INDEPENDENT_AMBULATORY_CARE_PROVIDER_SITE_OTHER): Admitting: Family Medicine

## 2023-05-03 VITALS — BP 118/76 | HR 81 | Ht 65.0 in | Wt 134.1 lb

## 2023-05-03 DIAGNOSIS — R7303 Prediabetes: Secondary | ICD-10-CM | POA: Diagnosis not present

## 2023-05-03 DIAGNOSIS — E782 Mixed hyperlipidemia: Secondary | ICD-10-CM

## 2023-05-03 DIAGNOSIS — I10 Essential (primary) hypertension: Secondary | ICD-10-CM

## 2023-05-03 NOTE — Assessment & Plan Note (Signed)
 Blood pressure readings at home range from 110s to 130s, generally acceptable with occasional elevations due to stress or anxiety. Currently on metoprolol and losartan, both well-tolerated. Emphasized importance of resting before measurements. Proactive in monitoring blood pressure. - Continue metoprolol and losartan - Monitor blood pressure at home - Consider additional half dose of metoprolol for persistent palpitations

## 2023-05-03 NOTE — Assessment & Plan Note (Signed)
 Recommend low carb diet Recheck A1c

## 2023-05-03 NOTE — Progress Notes (Signed)
 Established patient visit   Patient: Tracie Hall   DOB: Jul 25, 1955   68 y.o. Female  MRN: [id number] Visit Date: 05/03/2023  Today's healthcare provider: Shirlee Latch, MD   Chief Complaint  Patient presents with   Hypertension    Pt ranging 115 to 125 / 80 at home, no concerns   Follow-up   Subjective    Hypertension   HPI     Hypertension    Additional comments: Pt ranging 115 to 125 / 80 at home, no concerns      Last edited by Allayne Stack on 05/03/2023  3:14 PM.       Discussed the use of AI scribe software for clinical note transcription with the patient, who gave verbal consent to proceed.  History of Present Illness   The patient, with a history of hypertension, presents for a follow-up visit. She reports that her blood pressure readings at home have been mostly in the 120s, occasionally rising to the 130s. The patient notes that her blood pressure tends to increase when she is stressed or worried about family members. She also mentions occasional heart flutters, which resolve on their own. The patient has been considering taking an extra half dose of metoprolol during these episodes but has not done so yet. She also reports difficulty falling asleep due to an active mind and is considering taking melatonin. 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 She is still recovering from the surgery. The patient is also due for a colonoscopy in a year and is aware of the changes in her gastroenterologist's practice.       Medications: Outpatient Medications Prior to Visit  Medication Sig   Blood Glucose Monitoring Suppl (ONE TOUCH ULTRA 2) w/Device KIT Use as directed   CALCIUM-VITAMIN D PO Take 600 mg by mouth in the morning and at bedtime.   conjugated estrogens (PREMARIN) vaginal cream PLACE 0.25 APPLICATORFULS VAGINALLY 2 (TWO) TIMES A  WEEK.   cyanocobalamin 1000 MCG tablet Take 1,000 mcg by mouth 3 (three) times a week.   glucose blood (ONETOUCH ULTRA) test strip Use as instructed to test blood sugar daily   Lancets (ONETOUCH DELICA PLUS LANCET33G) MISC USE TO CHECK BLOOD GLUCOSE EVERY DAY   losartan (COZAAR) 50 MG tablet TAKE 1 TABLET(50 MG) BY MOUTH DAILY   metoprolol succinate (TOPROL-XL) 50 MG 24 hr tablet Take 1 tablet (50 mg total) by mouth daily. Take with or immediately following a meal.   [DISCONTINUED] benzonatate (TESSALON) 100 MG capsule Take 1 capsule (100 mg total) by mouth 2 (two) times daily as needed for cough. (Patient not taking: Reported on 05/03/2023)   [DISCONTINUED] magic mouthwash (nystatin, lidocaine, diphenhydrAMINE, alum & mag hydroxide) suspension Swish and spit 5 mLs 4 (four) times daily as needed for mouth pain. (Patient not taking: Reported on 05/03/2023)   No facility-administered medications prior to visit.    Review of Systems      Objective    BP 118/76 Comment: home reading  Pulse 81   Ht 5\' 5"  (1.651 m)   Wt 134 lb 1.6 oz (60.8 kg)   SpO2 100%   BMI 22.32 kg/m    Physical Exam Vitals reviewed.  Constitutional:      General: She is not in acute distress.    Appearance: Normal appearance. She is well-developed. She  is not diaphoretic.  HENT:     Head: Normocephalic and atraumatic.  Eyes:     General: No scleral icterus.    Conjunctiva/sclera: Conjunctivae normal.  Neck:     Thyroid: No thyromegaly.  Cardiovascular:     Rate and Rhythm: Normal rate and regular rhythm.     Heart sounds: Normal heart sounds. No murmur heard. Pulmonary:     Effort: Pulmonary effort is normal. No respiratory distress.     Breath sounds: Normal breath sounds. No wheezing, rhonchi or rales.  Musculoskeletal:     Cervical back: Neck supple.     Right lower leg: No edema.     Left lower leg: No edema.  Lymphadenopathy:     Cervical: No cervical adenopathy.  Skin:    General: Skin is warm  and dry.     Findings: No rash.  Neurological:     Mental Status: She is alert and oriented to person, place, and time. Mental status is at baseline.  Psychiatric:        Mood and Affect: Mood normal.        Behavior: Behavior normal.      No results found for any visits on 05/03/23.  Assessment & Plan     Problem List Items Addressed This Visit       Cardiovascular and Mediastinum   Hypertension - Primary   Blood pressure readings at home range from 110s to 130s, generally acceptable with occasional elevations due to stress or anxiety. Currently on metoprolol and losartan, both well-tolerated. Emphasized importance of resting before measurements. Proactive in monitoring blood pressure. - Continue metoprolol and losartan - Monitor blood pressure at home - Consider additional half dose of metoprolol for persistent palpitations      Relevant Orders   Basic Metabolic Panel (BMET)     Other   Prediabetes   Recommend low carb diet Recheck A1c       Relevant Orders   Hemoglobin A1c   Hyperlipidemia  Recheck at CPE     Palpitations Reports occasional palpitations or fluttering sensations, usually self-resolving. Advised to take an additional half dose of metoprolol if palpitations persist, confirmed post-losartan initiation. - Consider additional half dose of metoprolol for persistent palpitations  Sleep Disturbance Experiences difficulty falling asleep due to an overactive mind. Confirmed safe to take melatonin with metoprolol and losartan. 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 Recovery ongoing, expected to take six months for gums to recede and swelling to subside. - Monitor recovery from dental surgery - Follow up with dental specialist as needed  General Health Maintenance Up to date with vaccinations. Scheduled next physical. Reviewed labs, decided to check  A1c and kidney function today, as these do not require fasting. - Check A1c and kidney function today - Schedule cholesterol and vitamin labs at next physical - Encourage continued exercise  Follow-up Scheduled for next physical in September. Planning for colonoscopy next year, aware of changes in local GI practice. Discussed potential need for referral if practice changes persist. - Follow up with physical in September - Plan for colonoscopy next year, reassess GI practice situation       Return in about 6 months (around 11/02/2023) for CPE, as scheduled.       Shirlee Latch, MD  [facility] [phone] (phone) [phone] (fax)  [facility]

## 2023-05-04 LAB — BASIC METABOLIC PANEL WITH GFR
BUN/Creatinine Ratio: 18 (ref 12–28)
BUN: 14 mg/dL (ref 8–27)
CO2: 25 mmol/L (ref 20–29)
Calcium: 9.6 mg/dL (ref 8.7–10.3)
Chloride: 101 mmol/L (ref 96–106)
Creatinine, Ser: 0.79 mg/dL (ref 0.57–1.00)
Glucose: 114 mg/dL — ABNORMAL HIGH (ref 70–99)
Potassium: 3.9 mmol/L (ref 3.5–5.2)
Sodium: 141 mmol/L (ref 134–144)
eGFR: 81 mL/min/{1.73_m2} (ref >59)

## 2023-05-04 LAB — HEMOGLOBIN A1C
Est. average glucose Bld gHb Est-mCnc: 114 mg/dL
Hgb A1c MFr Bld: 5.6 % (ref 4.8–5.6)

## 2023-05-05 ENCOUNTER — Encounter: Payer: Self-pay | Admitting: Family Medicine

## 2023-05-05 DIAGNOSIS — K080 Exfoliation of teeth due to systemic causes: Secondary | ICD-10-CM | POA: Diagnosis not present

## 2023-05-22 ENCOUNTER — Encounter: Payer: Self-pay | Admitting: Cardiovascular Disease

## 2023-05-26 ENCOUNTER — Encounter: Payer: Self-pay | Admitting: Cardiology

## 2023-05-26 ENCOUNTER — Ambulatory Visit: Attending: Cardiology | Admitting: Cardiology

## 2023-05-26 ENCOUNTER — Ambulatory Visit

## 2023-05-26 VITALS — BP 142/78 | HR 85 | Ht 65.0 in | Wt 137.2 lb

## 2023-05-26 DIAGNOSIS — R002 Palpitations: Secondary | ICD-10-CM

## 2023-05-26 DIAGNOSIS — R000 Tachycardia, unspecified: Secondary | ICD-10-CM | POA: Diagnosis not present

## 2023-05-26 DIAGNOSIS — I700 Atherosclerosis of aorta: Secondary | ICD-10-CM | POA: Diagnosis not present

## 2023-05-26 DIAGNOSIS — I10 Essential (primary) hypertension: Secondary | ICD-10-CM | POA: Diagnosis not present

## 2023-05-26 DIAGNOSIS — E782 Mixed hyperlipidemia: Secondary | ICD-10-CM

## 2023-05-26 NOTE — Progress Notes (Signed)
 Cardiology Office Note:  .   Date:  05/26/2023  ID:  Tracie Hall, DOB 12-Jan-1956, MRN [id number] PCP: Mazie Speed, MD  [facility] Providers Cardiologist:  None    History of Present Illness: .   Tracie Hall is a 68 y.o. female with past medical history of primary hypertension, varicose veins of bilateral lower extremities, h/o MVP, GERD, essential tremor, cervical osteoarthritis, prediabetes, mixed hyperlipidemia, who is being seen today for evaluation of sinus tachycardia.  CT scanning of the abdomen and pelvis in 2022 showed minimal aortic atherosclerosis and iliac disease.  Overall she was doing well that December blood pressure was slightly elevated but suspected situational is questionable whitecoat syndrome.  She had called in to triage line and losartan  50 mg was started for better blood pressure control which was later decreased to 25 mg daily.  She was last seen in clinic 01/04/2023 by Dr. Gollan.  She complains of weakness and fatigue with decreased her blood pressure.  Blood pressure was elevated at her appointment.  She states that she was anxious.  Was having a breast biopsy completed.  There were no medication changes were made and further testing was ordered.  She returns to clinic today stating that she has been experiencing palpitations and elevated heart rate on her blood pressure monitor at home.  She has also noted fluctuations in her blood pressure.  She denies any other associated symptoms of chest pain, shortness of breath, peripheral edema.  She stated that all day night she had been suffering from a stiff neck and back issues throughout the day and noticed some occasional fluttering.  She was woken from sleep with a pounding heart flutters and was unable to return to sleep.  When on throughout the day and then some occasional floaters the ear and the ear since that time.  She has been compliant with her current medication regimen of losartan  and  Toprol -XL.  She states that she has to have an coffee to decrease her caffeine, has limited alcohol, and has not had any issues with dehydration.  She denies any hospitalizations or visits to the emergency department.  ROS: 10 point review of systems has been reviewed and considered negative with exception was been listed in the HPI  Studies Reviewed: Aaron Aas   EKG Interpretation Date/Time:  Thursday May 26 2023 14:41:30 EDT Ventricular Rate:  85 PR Interval:  152 QRS Duration:  68 QT Interval:  370 QTC Calculation: 440 R Axis:   11  Text Interpretation: Normal sinus rhythm Normal ECG When compared with ECG of 04-Jan-2023 10:27, No significant change was found Confirmed by Ronald Cockayne ([id number]) on 05/26/2023 2:49:01 PM    N/A Risk Assessment/Calculations:     HYPERTENSION CONTROL Vitals:   05/26/23 1437 05/26/23 1445  BP: (!) 156/80 (!) 142/78    The patient's blood pressure is elevated above target today.  In order to address the patient's elevated BP: Blood pressure will be monitored at home to determine if medication changes need to be made.          Physical Exam:   VS:  BP (!) 142/78 (BP Location: Left Arm, Patient Position: Sitting, Cuff Size: Normal)   Pulse 85   Ht 5\' 5"  (1.651 m)   Wt 137 lb 3.2 oz (62.2 kg)   SpO2 99%   BMI 22.83 kg/m    Wt Readings from Last 3 Encounters:  05/26/23 137 lb 3.2 oz (62.2 kg)  05/03/23 134 lb 1.6  oz (60.8 kg)  03/03/23 133 lb 12.8 oz (60.7 kg)    GEN: Well nourished, well developed in no acute distress NECK: No JVD; No carotid bruits CARDIAC: RRR, no murmurs, rubs, gallops RESPIRATORY:  Clear to auscultation without rales, wheezing or rhonchi  ABDOMEN: Soft, non-tender, non-distended EXTREMITIES:  No edema; No deformity   ASSESSMENT AND PLAN: .   Palpitations with sinus tachycardia noted on blood pressure monitor at home.  With episodes waking him from sleep being more frequent she has been placed on a ZIO XT monitor for 2 weeks to  rule out any arrhythmia.  She has been advised to continue to take her Toprol -XL as previously prescribed to 50 mg a day but for instances where she has palpitations that lasts greater than 10 to 15 minutes to take an additional half dose of her Toprol -XL.    Primary hypertension with a blood pressure today 156/80 with a repeat of 142/78.  She is very diligent with monitoring her blood pressures at home.  She has been continued on losartan  50 mg daily and Toprol -XL 50 mg daily.  She has been encouraged to continue to monitor pressures 1 to 2 hours postmedication administration as well.  She continues to have elevated pressures greater than a goal of 130/80 will consider adjustment of the medication on return.  Aortic atherosclerosis that was minimally noted on the aorta iliacs.  She has been working on diet and continued exercise 4 to 5 days/week.  Previously had labs done and revealed LDL of 103 in 09/2022.  She will need repeat lipid panel on return to see if dietary changes and increasing her activity has improved these levels.  Mixed hyperlipidemia with LDL of 103.  She has a 12.5% 10-year risk of CVD according to the ASCVD risk calculator.  Moderate intensity statin is recommended.  Will repeat the lipid on return and discuss statin initiation if continues to remain elevated.    Dispo: Patient to return to clinic to see MD/APP in 6 weeks or sooner if needed for evaluation after wearing monitor  Signed, Yichen Gilardi, NP

## 2023-05-26 NOTE — Patient Instructions (Signed)
 Medication Instructions:  Your Physician recommend you continue on your current medication as directed.    *If you need a refill on your cardiac medications before your next appointment, please call your pharmacy*  Lab Work: No labs ordered today  If you have labs (blood work) drawn today and your tests are completely normal, you will receive your results only by: MyChart Message (if you have MyChart) OR A paper copy in the mail If you have any lab test that is abnormal or we need to change your treatment, we will call you to review the results.  Testing/Procedures: ZIO monitor  ZIO XT- Long Term Monitor Instructions  Your physician has requested you wear a ZIO patch monitor for 14 days.  This is a single patch monitor. [employer] supplies one patch monitor per enrollment. Additional stickers are not available. Please do not apply patch if you will be having a Nuclear Stress Test,  Echocardiogram, Cardiac CT, MRI, or Chest Xray during the period you would be wearing the  monitor. The patch cannot be worn during these tests. You cannot remove and re-apply the  ZIO XT patch monitor.  Your ZIO patch monitor will be mailed 3 day USPS to your address on file. It may take 3-5 days  to receive your monitor after you have been enrolled.  Once you have received your monitor, please review the enclosed instructions. Your monitor  has already been registered assigning a specific monitor serial # to you.  Billing and Patient Assistance Program Information  We have supplied [employer] with any of your insurance information on file for billing purposes. [employer] offers a sliding scale Patient Assistance Program for patients that do not have  insurance, or whose insurance does not completely cover the cost of the ZIO monitor.  You must apply for the Patient Assistance Program to qualify for this discounted rate.  To apply, please call [employer] at [phone], select option 4, select option 2, ask to apply  for  Patient Assistance Program. Sanna Crystal will ask your household income, and how many people  are in your household. They will quote your out-of-pocket cost based on that information.  [employer] will also be able to set up a 23-month, interest-free payment plan if needed.  Applying the monitor   Shave hair from upper left chest.  Hold abrader disc by orange tab. Rub abrader in 40 strokes over the upper left chest as  indicated in your monitor instructions.  Clean area with 4 enclosed alcohol pads. Let dry.  Apply patch as indicated in monitor instructions. Patch will be placed under collarbone on left  side of chest with arrow pointing upward.  Rub patch adhesive wings for 2 minutes. Remove white label marked "1". Remove the white  label marked "2". Rub patch adhesive wings for 2 additional minutes.  While looking in a mirror, press and release button in center of patch. A small green light will  flash 3-4 times. This will be your only indicator that the monitor has been turned on.  Do not shower for the first 24 hours. You may shower after the first 24 hours.  Press the button if you feel a symptom. You will hear a small click. Record Date, Time and  Symptom in the Patient Logbook.  When you are ready to remove the patch, follow instructions on the last 2 pages of Patient  Logbook. Stick patch monitor onto the last page of Patient Logbook.  Place Patient Logbook in the blue and white box.  Use locking tab on box and tape box closed  securely. The blue and white box has prepaid postage on it. Please place it in the mailbox as  soon as possible. Your physician should have your test results approximately 7 days after the  monitor has been mailed back to [facility].  Call [facility] Customer Care at [phone] if you have questions regarding  your ZIO XT patch monitor. Call them immediately if you see an orange light blinking on your  monitor.  If your monitor falls off in less than  4 days, contact our Monitor department at [phone].  If your monitor becomes loose or falls off after 4 days call [employer] at [phone] for  suggestions on securing your monitor   Follow-Up: At [facility], you and your health needs are our priority.  As part of our continuing mission to provide you with exceptional heart care, our providers are all part of one team.  This team includes your primary Cardiologist (physician) and Advanced Practice Providers or APPs (Physician Assistants and Nurse Practitioners) who all work together to provide you with the care you need, when you need it.  Your next appointment:   6 week(s)  Provider:   Ronald Cockayne, NP

## 2023-06-01 DIAGNOSIS — M9905 Segmental and somatic dysfunction of pelvic region: Secondary | ICD-10-CM | POA: Diagnosis not present

## 2023-06-01 DIAGNOSIS — M9901 Segmental and somatic dysfunction of cervical region: Secondary | ICD-10-CM | POA: Diagnosis not present

## 2023-06-01 DIAGNOSIS — M9903 Segmental and somatic dysfunction of lumbar region: Secondary | ICD-10-CM | POA: Diagnosis not present

## 2023-06-01 DIAGNOSIS — M9902 Segmental and somatic dysfunction of thoracic region: Secondary | ICD-10-CM | POA: Diagnosis not present

## 2023-06-03 DIAGNOSIS — M9902 Segmental and somatic dysfunction of thoracic region: Secondary | ICD-10-CM | POA: Diagnosis not present

## 2023-06-03 DIAGNOSIS — M9901 Segmental and somatic dysfunction of cervical region: Secondary | ICD-10-CM | POA: Diagnosis not present

## 2023-06-03 DIAGNOSIS — M9905 Segmental and somatic dysfunction of pelvic region: Secondary | ICD-10-CM | POA: Diagnosis not present

## 2023-06-03 DIAGNOSIS — M9903 Segmental and somatic dysfunction of lumbar region: Secondary | ICD-10-CM | POA: Diagnosis not present

## 2023-06-20 DIAGNOSIS — R002 Palpitations: Secondary | ICD-10-CM | POA: Diagnosis not present

## 2023-06-21 DIAGNOSIS — H2513 Age-related nuclear cataract, bilateral: Secondary | ICD-10-CM | POA: Diagnosis not present

## 2023-06-21 DIAGNOSIS — H43813 Vitreous degeneration, bilateral: Secondary | ICD-10-CM | POA: Diagnosis not present

## 2023-07-07 ENCOUNTER — Encounter: Payer: Self-pay | Admitting: Cardiology

## 2023-07-07 ENCOUNTER — Ambulatory Visit: Attending: Cardiology | Admitting: Cardiology

## 2023-07-07 VITALS — BP 138/81 | HR 79 | Ht 65.0 in | Wt 139.0 lb

## 2023-07-07 DIAGNOSIS — R000 Tachycardia, unspecified: Secondary | ICD-10-CM | POA: Diagnosis not present

## 2023-07-07 DIAGNOSIS — I700 Atherosclerosis of aorta: Secondary | ICD-10-CM

## 2023-07-07 DIAGNOSIS — I10 Essential (primary) hypertension: Secondary | ICD-10-CM | POA: Diagnosis not present

## 2023-07-07 DIAGNOSIS — E782 Mixed hyperlipidemia: Secondary | ICD-10-CM

## 2023-07-07 DIAGNOSIS — R002 Palpitations: Secondary | ICD-10-CM

## 2023-07-07 NOTE — Patient Instructions (Signed)
 Medication Instructions:  Your physician recommends that you continue on your current medications as directed. Please refer to the Current Medication list given to you today.   *If you need a refill on your cardiac medications before your next appointment, please call your pharmacy*  Lab Work: No labs ordered today  If you have labs (blood work) drawn today and your tests are completely normal, you will receive your results only by: MyChart Message (if you have MyChart) OR A paper copy in the mail If you have any lab test that is abnormal or we need to change your treatment, we will call you to review the results.  Testing/Procedures: No test ordered today   Follow-Up: At [facility], you and your health needs are our priority.  As part of our continuing mission to provide you with exceptional heart care, our providers are all part of one team.  This team includes your primary Cardiologist (physician) and Advanced Practice Providers or APPs (Physician Assistants and Nurse Practitioners) who all work together to provide you with the care you need, when you need it.  Your next appointment:   6 month(s)  Provider:   Timothy Gollan, MD

## 2023-07-07 NOTE — Progress Notes (Signed)
 Cardiology Office Note   Date:  07/07/2023  ID:  Tracie Hall, DOB 07-09-1955, MRN [id number] PCP: Mazie Speed, MD  [facility] Providers Cardiologist:  None     History of Present Illness Tracie Hall is a 68 y.o. female with a past medical history of pulm hypertension, varicose veins of bilateral lower extremities, history of DVT, GERD, essential tremor, cervical osteo arthritis, prediabetes, mixed hyperlipidemia, is being seen today for follow-up.   A CT scan of the abdomen and pelvis in 2022 showed minimal aortic atherosclerosis and iliac disease.  Overall she was doing well at this time her blood pressure was slightly elevated but suspect situational is questionable whitecoat syndrome.  She had called the triage line losartan  50 mg started for better blood pressure control which was later decreased to 25 mg daily.  She previously been evaluated in clinic 01/04/2023 by Dr. Gollan with complaints of weakness, fatigue, decreased blood pressure.  Blood pressure was elevated at her appointment.  She states that she was anxious.  She was having a breast biopsy completed.  There were no medications that were changed and no further testing that was ordered.   She was last seen in clinic 05/26/2023 experiencing palpitations elevated heart rate, on her blood pressure monitor at home.  She noted fluctuations in her blood pressure.  Denies any other associated symptoms.  She been compliant with her current medication of losartan  and Toprol -XL.  She states that she did have coffee and was trying to decrease her caffeine, limit her alcohol and avoid issues with dehydration.  She was placed on a ZIO XT monitor for 2 weeks to rule out arrhythmia.  She returns to clinic today accompanied by her husband.  She occasionally continues to have palpitations provide her blood pressure have been well-controlled along she has been keeping.  There have been no fluctuations.  No other associated  symptoms.  No irregular alarms to her irregular heartbeat on her blood pressure monitor.  She states that she has been compliant with her current medication regimen without any adverse side effects.  Denies any hospitalizations or visits to the emergency department.  ROS: 10 point review of systems reviewed and considered negative except what is listed in the HPI  Studies Reviewed EKG Interpretation Date/Time:  Thursday July 07 2023 11:27:14 EDT Ventricular Rate:  79 PR Interval:  146 QRS Duration:  74 QT Interval:  378 QTC Calculation: 433 R Axis:   10  Text Interpretation: Sinus rhythm with Premature supraventricular complexes When compared with ECG of 26-May-2023 14:41, Confirmed by Ronald Cockayne ([id number]) on 07/07/2023 11:30:41 AM    Preliminary event monitor report 06/21/2023 Patient had a min HR of 52 bpm, max HR of 171 bpm, and avg HR of 70 bpm. Predominant underlying rhythm was Sinus Rhythm. 46 Supraventricular Tachycardia runs occurred, the run with the fastest interval lasting 4 beats with a max rate of 171 bpm, the  longest lasting 17.2 secs with an avg rate of 126 bpm. Some episodes of Supraventricular Tachycardia may be possible Atrial Tachycardia with variable block. Isolated SVEs were rare (<1.0%), SVE Couplets were rare (<1.0%), and SVE Triplets were rare  (<1.0%). Isolated VEs were rare (<1.0%), VE Couplets were rare (<1.0%), and no VE Triplets were present. Ventricular Bigeminy and Trigeminy were present. Risk Assessment/Calculations           Physical Exam VS:  BP 138/81 (BP Location: Left Arm, Patient Position: Sitting)   Pulse 79   Ht  5' 5 (1.651 m)   Wt 139 lb (63 kg)   SpO2 99%   BMI 23.13 kg/m    Wt Readings from Last 3 Encounters:  07/07/23 139 lb (63 kg)  05/26/23 137 lb 3.2 oz (62.2 kg)  05/03/23 134 lb 1.6 oz (60.8 kg)    GEN: Well nourished, well developed in no acute distress NECK: No JVD; No carotid bruits CARDIAC: RRR, no murmurs, rubs,  gallops RESPIRATORY:  Clear to auscultation without rales, wheezing or rhonchi  ABDOMEN: Soft, non-tender, non-distended EXTREMITIES:  No edema; No deformity   ASSESSMENT AND PLAN Palpitations with sinus tachycardia noted on blood pressure monitor at home.  She has not had any reoccurrence of these episodes.  She did wear a ZIO XT monitor preliminary report revealed an average heart rate of 70 bpm with 47 supraventricular tachycardia runs were noted but no atrial flutter flutter no pauses.  She has been advised to continue taking her Toprol -XL as previously prescribed 50 mg daily for instances of her palpitations last greater than 1050 minutes to take an additional half of a dose of the Toprol -XL at that time.  EKG today.  Symptoms rate 79 with 1 singular premature atrial contraction with no significant change from prior studies.  Primary hypertension with blood pressure 138/81 today.  Blood pressures remain stable.  She is continued on losartan  50 mg daily Toprol -XL 50 mg daily.  She has been encouraged to continue to monitor pressure 1 to 2 hours postmedication administration as well.  Aortic atherosclerosis noted to be minimally iliacs.  She continues to work on diet and continued exercise.  Will need updated lipid panel on return.  Mixed hyperlipidemia 3.  History of 0.5% 10-year risk of CVD according to ASCVD risk nuclear.  Intensity statin is recommended.  She has been working on diet and exercise on return.       Dispo: Patient return to clinic to see me/APP in 6 months or sooner if needed for further evaluation of symptoms.  Signed, Iram Lundberg, NP

## 2023-07-10 DIAGNOSIS — R002 Palpitations: Secondary | ICD-10-CM

## 2023-07-12 ENCOUNTER — Ambulatory Visit: Payer: Self-pay | Admitting: Cardiology

## 2023-07-12 NOTE — Progress Notes (Signed)
 Results discussed during previous appointment.

## 2023-07-19 DIAGNOSIS — K080 Exfoliation of teeth due to systemic causes: Secondary | ICD-10-CM | POA: Diagnosis not present

## 2023-07-22 ENCOUNTER — Other Ambulatory Visit: Payer: Self-pay | Admitting: Family Medicine

## 2023-07-22 DIAGNOSIS — Z1231 Encounter for screening mammogram for malignant neoplasm of breast: Secondary | ICD-10-CM

## 2023-08-16 DIAGNOSIS — K080 Exfoliation of teeth due to systemic causes: Secondary | ICD-10-CM | POA: Diagnosis not present

## 2023-08-31 DIAGNOSIS — M5459 Other low back pain: Secondary | ICD-10-CM | POA: Diagnosis not present

## 2023-08-31 DIAGNOSIS — M9903 Segmental and somatic dysfunction of lumbar region: Secondary | ICD-10-CM | POA: Diagnosis not present

## 2023-08-31 DIAGNOSIS — M542 Cervicalgia: Secondary | ICD-10-CM | POA: Diagnosis not present

## 2023-08-31 DIAGNOSIS — M9901 Segmental and somatic dysfunction of cervical region: Secondary | ICD-10-CM | POA: Diagnosis not present

## 2023-09-07 DIAGNOSIS — M9903 Segmental and somatic dysfunction of lumbar region: Secondary | ICD-10-CM | POA: Diagnosis not present

## 2023-09-07 DIAGNOSIS — M9901 Segmental and somatic dysfunction of cervical region: Secondary | ICD-10-CM | POA: Diagnosis not present

## 2023-09-07 DIAGNOSIS — M5459 Other low back pain: Secondary | ICD-10-CM | POA: Diagnosis not present

## 2023-09-07 DIAGNOSIS — M542 Cervicalgia: Secondary | ICD-10-CM | POA: Diagnosis not present

## 2023-10-06 ENCOUNTER — Other Ambulatory Visit: Payer: Self-pay

## 2023-10-06 ENCOUNTER — Telehealth: Payer: Self-pay | Admitting: Family Medicine

## 2023-10-06 DIAGNOSIS — I10 Essential (primary) hypertension: Secondary | ICD-10-CM

## 2023-10-06 MED ORDER — METOPROLOL SUCCINATE ER 50 MG PO TB24: 50.0000 mg | ORAL_TABLET | Freq: Every day | ORAL | 3 refills | Status: AC

## 2023-10-06 NOTE — Telephone Encounter (Signed)
 Converted into a refill request

## 2023-10-06 NOTE — Telephone Encounter (Signed)
[facility] faxed refill request for the following medications:   metoprolol succinate (TOPROL-XL) 50 MG 24 hr tablet   Please advise.  

## 2023-10-12 DIAGNOSIS — D2272 Melanocytic nevi of left lower limb, including hip: Secondary | ICD-10-CM | POA: Diagnosis not present

## 2023-10-12 DIAGNOSIS — D2262 Melanocytic nevi of left upper limb, including shoulder: Secondary | ICD-10-CM | POA: Diagnosis not present

## 2023-10-12 DIAGNOSIS — D2261 Melanocytic nevi of right upper limb, including shoulder: Secondary | ICD-10-CM | POA: Diagnosis not present

## 2023-10-12 DIAGNOSIS — D225 Melanocytic nevi of trunk: Secondary | ICD-10-CM | POA: Diagnosis not present

## 2023-10-12 DIAGNOSIS — L570 Actinic keratosis: Secondary | ICD-10-CM | POA: Diagnosis not present

## 2023-10-18 ENCOUNTER — Ambulatory Visit (INDEPENDENT_AMBULATORY_CARE_PROVIDER_SITE_OTHER): Payer: Self-pay

## 2023-10-18 DIAGNOSIS — Z0000 Encounter for general adult medical examination without abnormal findings: Secondary | ICD-10-CM

## 2023-10-18 DIAGNOSIS — Z1231 Encounter for screening mammogram for malignant neoplasm of breast: Secondary | ICD-10-CM

## 2023-10-18 NOTE — Patient Instructions (Addendum)
 Ms. Earll,  Thank you for taking the time for your Medicare Wellness Visit. I appreciate your continued commitment to your health goals. Please review the care plan we discussed, and feel free to reach out if I can assist you further.  Medicare recommends these wellness visits once per year to help you and your care team stay ahead of potential health issues. These visits are designed to focus on prevention, allowing your provider to concentrate on managing your acute and chronic conditions during your regular appointments.  Please note that Annual Wellness Visits do not include a physical exam. Some assessments may be limited, especially if the visit was conducted virtually. If needed, we may recommend a separate in-person follow-up with your provider.  Ongoing Care Seeing your primary care provider every 3 to 6 months helps us  monitor your health and provide consistent, personalized care.   Referrals If a referral was made during today's visit and you haven't received any updates within two weeks, please contact the referred provider directly to check on the status.  Recommended Screenings:  Health Maintenance  Topic Date Due   Flu Shot  08/26/2023   COVID-19 Vaccine (5 - 2025-26 season) 09/26/2023   Colon Cancer Screening  07/01/2024   Medicare Annual Wellness Visit  10/17/2024   Breast Cancer Screening  11/16/2024   DEXA scan (bone density measurement)  03/18/2027   DTaP/Tdap/Td vaccine (4 - Td or Tdap) 10/24/2028   Pneumococcal Vaccine for age over 35  Completed   Hepatitis C Screening  Completed   Zoster (Shingles) Vaccine  Completed   HPV Vaccine  Aged Out   Meningitis B Vaccine  Aged Out     Advance Care Planning is important because it: Ensures you receive medical care that aligns with your values, goals, and preferences. Provides guidance to your family and loved ones, reducing the emotional burden of decision-making during critical moments.  Vision: Annual vision  screenings are recommended for early detection of glaucoma, cataracts, and diabetic retinopathy. These exams can also reveal signs of chronic conditions such as diabetes and high blood pressure.  Dental: Annual dental screenings help detect early signs of oral cancer, gum disease, and other conditions linked to overall health, including heart disease and diabetes.  Please see the attached documents for additional preventive care recommendations.   NEXT AWV 10/23/24 @ 10:10 AM BY PHONE
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
## 2023-10-20 ENCOUNTER — Ambulatory Visit (INDEPENDENT_AMBULATORY_CARE_PROVIDER_SITE_OTHER): Payer: Self-pay | Admitting: Family Medicine

## 2023-10-20 ENCOUNTER — Encounter: Payer: Self-pay | Admitting: Family Medicine

## 2023-10-20 VITALS — BP 115/77 | HR 84 | Ht 65.0 in | Wt 143.4 lb

## 2023-10-20 DIAGNOSIS — R7303 Prediabetes: Secondary | ICD-10-CM | POA: Diagnosis not present

## 2023-10-20 DIAGNOSIS — E538 Deficiency of other specified B group vitamins: Secondary | ICD-10-CM | POA: Diagnosis not present

## 2023-10-20 DIAGNOSIS — G629 Polyneuropathy, unspecified: Secondary | ICD-10-CM

## 2023-10-20 DIAGNOSIS — Z23 Encounter for immunization: Secondary | ICD-10-CM

## 2023-10-20 DIAGNOSIS — E559 Vitamin D deficiency, unspecified: Secondary | ICD-10-CM

## 2023-10-20 DIAGNOSIS — R202 Paresthesia of skin: Secondary | ICD-10-CM

## 2023-10-20 DIAGNOSIS — I10 Essential (primary) hypertension: Secondary | ICD-10-CM | POA: Diagnosis not present

## 2023-10-20 DIAGNOSIS — Z0000 Encounter for general adult medical examination without abnormal findings: Secondary | ICD-10-CM

## 2023-10-20 DIAGNOSIS — E782 Mixed hyperlipidemia: Secondary | ICD-10-CM | POA: Diagnosis not present

## 2023-10-20 DIAGNOSIS — G4700 Insomnia, unspecified: Secondary | ICD-10-CM

## 2023-10-20 DIAGNOSIS — Z1231 Encounter for screening mammogram for malignant neoplasm of breast: Secondary | ICD-10-CM

## 2023-10-20 MED ORDER — ONETOUCH ULTRA VI STRP: ORAL_STRIP | 12 refills | Status: DC

## 2023-10-20 NOTE — Progress Notes (Signed)
 Complete physical exam   Patient: Tracie Hall   DOB: 1955/12/19   68 y.o. Female  MRN: [id number] Visit Date: 10/20/2023  Today's healthcare provider: Jon Eva, MD   Chief Complaint  Patient presents with   Annual Exam    Last completed 10/14/22, AWV completed 10/18/23. Concerns: Restless and would like to see about taking melatonin, Continued tingling in toes that was previously discussed, Question about colonoscopy and is Dr.Wohl back in office to complete hers next year, needing a order for mammogram state they let her schedule even though she did not have an order and would like a refill on her test strips   Subjective    Tracie Hall is a 68 y.o. female who presents today for a complete physical exam.     Discussed the use of AI scribe software for clinical note transcription with the patient, who gave verbal consent to proceed.  History of Present Illness   Tracie Hall is a 68 year old female who presents for an annual physical exam.  She manages hypertension with losartan  50 mg daily and Toprol  XL 50 mg daily. Home blood pressure readings are consistently in the 120s/70s, occasionally dropping to the 110s. She experiences low energy with lower readings, which improves with hydration.  She is scheduled for a mammogram at the [facility] in [place].  She exercises three to five times a week to manage prediabetes and cholesterol levels. Her last cholesterol check showed an increase in LDL from 77 to 103, with a stable HDL ratio.  She experiences non-painful tingling in her toes, attributed to past stiletto use, with no progression.  She occasionally uses melatonin 5 mg for sleep and is considering an increased dose if needed.        Last depression screening scores    10/18/2023   10:23 AM 05/03/2023    3:20 PM 03/03/2023   10:05 AM  PHQ 2/9 Scores  PHQ - 2 Score 0 0 0  PHQ- 9 Score 0  0   Last fall risk screening    10/15/2023   11:57  AM  Fall Risk   Falls in the past year? 0  Number falls in past yr: 0  Injury with Fall? 0  Risk for fall due to : No Fall Risks  Follow up Falls evaluation completed;Falls prevention discussed        Medications: Outpatient Medications Prior to Visit  Medication Sig   Blood Glucose Monitoring Suppl (ONE TOUCH ULTRA 2) w/Device KIT Use as directed   CALCIUM-VITAMIN D  PO Take 600 mg by mouth in the morning and at bedtime.   cyanocobalamin  1000 MCG tablet Take 1,000 mcg by mouth 3 (three) times a week.   Lancets (ONETOUCH DELICA PLUS LANCET33G) MISC USE TO CHECK BLOOD GLUCOSE EVERY DAY   losartan  (COZAAR ) 50 MG tablet TAKE 1 TABLET(50 MG) BY MOUTH DAILY   metoprolol  succinate (TOPROL -XL) 50 MG 24 hr tablet Take 1 tablet (50 mg total) by mouth daily. Take with or immediately following a meal.   [DISCONTINUED] glucose blood (ONETOUCH ULTRA) test strip Use as instructed to test blood sugar daily   No facility-administered medications prior to visit.    The 10-year ASCVD risk score (Arnett DK, et al., 2019) is: 8.4%    Review of Systems    Objective    BP 115/77 Comment: home reading  Pulse 84   Ht 5' 5 (1.651 m)   Wt 143 lb 6.4 oz (  65 kg)   SpO2 100%   BMI 23.86 kg/m    Physical Exam Vitals reviewed.  Constitutional:      General: She is not in acute distress.    Appearance: Normal appearance. She is well-developed. She is not diaphoretic.  HENT:     Head: Normocephalic and atraumatic.     Right Ear: Tympanic membrane, ear canal and external ear normal.     Left Ear: Tympanic membrane, ear canal and external ear normal.     Nose: Nose normal.     Mouth/Throat:     Mouth: Mucous membranes are moist.     Pharynx: Oropharynx is clear. No oropharyngeal exudate.  Eyes:     General: No scleral icterus.    Conjunctiva/sclera: Conjunctivae normal.     Pupils: Pupils are equal, round, and reactive to light.  Neck:     Thyroid: No thyromegaly.  Cardiovascular:      Rate and Rhythm: Normal rate and regular rhythm.     Heart sounds: Normal heart sounds. No murmur heard. Pulmonary:     Effort: Pulmonary effort is normal. No respiratory distress.     Breath sounds: Normal breath sounds. No wheezing or rales.  Abdominal:     General: There is no distension.     Palpations: Abdomen is soft.     Tenderness: There is no abdominal tenderness.  Musculoskeletal:        General: No deformity.     Cervical back: Neck supple.     Right lower leg: No edema.     Left lower leg: No edema.  Lymphadenopathy:     Cervical: No cervical adenopathy.  Skin:    General: Skin is warm and dry.     Findings: No rash.  Neurological:     Mental Status: She is alert and oriented to person, place, and time. Mental status is at baseline.     Gait: Gait normal.  Psychiatric:        Mood and Affect: Mood normal.        Behavior: Behavior normal.        Thought Content: Thought content normal.      No results found for any visits on 10/20/23.  Assessment & Plan    Routine Health Maintenance and Physical Exam  Exercise Activities and Dietary recommendations  Goals      DIET - INCREASE WATER  INTAKE        Immunization History  Administered Date(s) Administered   Fluad Quad(high Dose 65+) 10/22/2020, 11/17/2021   Fluad Trivalent(High Dose 65+) 10/14/2022   INFLUENZA, HIGH DOSE SEASONAL PF 10/03/2019   Influenza,inj,Quad PF,6+ Mos 10/20/2017, 10/25/2018   Influenza-Unspecified 10/21/2015, 10/18/2016, 10/20/2017, 10/25/2018   PFIZER(Purple Top)SARS-COV-2 Vaccination 02/14/2019, 03/05/2019, 12/04/2019   Pfizer Covid-19 Vaccine Bivalent Booster 21yrs & up 11/25/2020   Pneumococcal Conjugate-13 04/26/2019   Pneumococcal Polysaccharide-23 09/16/2020   Td 10/25/2018   Td (Adult), 2 Lf Tetanus Toxid, Preservative Free 10/25/2018   Tdap 05/29/2008   Zoster Recombinant(Shingrix) 10/03/2019, 01/30/2020   Zoster, Live 11/12/2014    Health Maintenance  Topic Date Due    Influenza Vaccine  08/26/2023   COVID-19 Vaccine (5 - 2025-26 season) 09/26/2023   Colonoscopy  07/01/2024   Medicare Annual Wellness (AWV)  10/17/2024   Mammogram  11/16/2024   DEXA SCAN  03/18/2027   DTaP/Tdap/Td (4 - Td or Tdap) 10/24/2028   Pneumococcal Vaccine: 50+ Years  Completed   Hepatitis C Screening  Completed   Zoster Vaccines- Shingrix  Completed  HPV VACCINES  Aged Out   Meningococcal B Vaccine  Aged Out    Discussed health benefits of physical activity, and encouraged her to engage in regular exercise appropriate for her age and condition.  Problem List Items Addressed This Visit       Cardiovascular and Mediastinum   Hypertension   Blood pressure readings at home are consistently good, ranging from 120s/70s to 115/70s. Occasionally experiences low energy, possibly due to dehydration, which improves with hydration. Current medications include losartan  50 mg daily and Toprol  XL 50 mg daily. - Continue losartan  50 mg daily - Continue Toprol  XL 50 mg daily - Encourage hydration to manage low energy      Relevant Orders   Comprehensive metabolic panel with GFR     Other   Avitaminosis D   Relevant Orders   VITAMIN D  25 Hydroxy (Vit-D Deficiency, Fractures)   Vitamin B 12 deficiency   Relevant Orders   B12   Paresthesias   Prediabetes   Previous A1c was 5.6, indicating normal range. Engaged in regular exercise at the gym, which may help in maintaining or improving A1c levels. - Order A1c test - Encourage regular exercise      Relevant Medications   glucose blood (ONETOUCH ULTRA) test strip   Other Relevant Orders   Hemoglobin A1c   Hyperlipidemia   Previous LDL was 103, increased from 77 the previous year. HDL levels have also increased, maintaining a stable ratio. ASCVD risk score is 8.4%, which is borderline. Regular exercise may help improve lipid profile. - Order lipid panel - Encourage regular exercise      Relevant Orders   Lipid panel    Comprehensive metabolic panel with GFR   Other Visit Diagnoses       Encounter for annual physical exam    -  Primary     Immunization due       Relevant Orders   Flu vaccine HIGH DOSE PF(Fluzone Trivalent)     Breast cancer screening by mammogram         Neuropathy         Insomnia, unspecified type               Adult Wellness Visit Annual wellness visit conducted. Discussed general health maintenance, including mammogram and colonoscopy scheduling. Reviewed vaccination status and confirmed flu shot administration. Discussed exercise routine and weight gain, attributing it to potential muscle gain from regular gym attendance. - Administer flu shot - Order mammogram - Schedule colonoscopy for March  Peripheral neuropathy of the feet Mild tingling in toes, likely due to past use of stilettos. No progression or pain noted. Neuropathy is not concerning at this time.  Insomnia Occasional difficulty sleeping. Has tried melatonin 5 mg with some effect. Discussed the safety of increasing melatonin to 10 mg if needed. Recommended magnesium glycinate 400 mg at bedtime as an alternative to aid sleep. - Consider melatonin 10 mg as needed - Recommend magnesium glycinate 400 mg at bedtime as needed       Return in about 6 months (around 04/18/2024) for chronic disease f/u.     Jon Eva, MD  [facility] [phone] (phone) [phone] (fax)  [facility]

## 2023-10-20 NOTE — Assessment & Plan Note (Signed)
 Previous A1c was 5.6, indicating normal range. Engaged in regular exercise at the gym, which may help in maintaining or improving A1c levels. - Order A1c test - Encourage regular exercise

## 2023-10-20 NOTE — Assessment & Plan Note (Signed)
 Previous LDL was 103, increased from 77 the previous year. HDL levels have also increased, maintaining a stable ratio. ASCVD risk score is 8.4%, which is borderline. Regular exercise may help improve lipid profile. - Order lipid panel - Encourage regular exercise

## 2023-10-20 NOTE — Assessment & Plan Note (Signed)
 Blood pressure readings at home are consistently good, ranging from 120s/70s to 115/70s. Occasionally experiences low energy, possibly due to dehydration, which improves with hydration. Current medications include losartan  50 mg daily and Toprol  XL 50 mg daily. - Continue losartan  50 mg daily - Continue Toprol  XL 50 mg daily - Encourage hydration to manage low energy

## 2023-10-21 ENCOUNTER — Ambulatory Visit: Payer: Self-pay | Admitting: Family Medicine

## 2023-10-21 LAB — COMPREHENSIVE METABOLIC PANEL WITH GFR
ALT: 14 IU/L (ref 0–32)
AST: 22 IU/L (ref 0–40)
Albumin: 4.3 g/dL (ref 3.9–4.9)
Alkaline Phosphatase: 75 IU/L (ref 49–135)
BUN/Creatinine Ratio: 24 (ref 12–28)
BUN: 19 mg/dL (ref 8–27)
Bilirubin Total: 0.9 mg/dL (ref 0.0–1.2)
CO2: 24 mmol/L (ref 20–29)
Calcium: 9.6 mg/dL (ref 8.7–10.3)
Chloride: 100 mmol/L (ref 96–106)
Creatinine, Ser: 0.79 mg/dL (ref 0.57–1.00)
Globulin, Total: 2.2 g/dL (ref 1.5–4.5)
Glucose: 109 mg/dL — ABNORMAL HIGH (ref 70–99)
Potassium: 4.1 mmol/L (ref 3.5–5.2)
Sodium: 139 mmol/L (ref 134–144)
Total Protein: 6.5 g/dL (ref 6.0–8.5)
eGFR: 81 mL/min/1.73 (ref >59)

## 2023-10-21 LAB — LIPID PANEL
Chol/HDL Ratio: 2.3 ratio (ref 0.0–4.4)
Cholesterol, Total: 200 mg/dL — ABNORMAL HIGH (ref 100–199)
HDL: 86 mg/dL (ref >39)
LDL Chol Calc (NIH): 99 mg/dL (ref 0–99)
Triglycerides: 86 mg/dL (ref 0–149)
VLDL Cholesterol Cal: 15 mg/dL (ref 5–40)

## 2023-10-21 LAB — HEMOGLOBIN A1C
Est. average glucose Bld gHb Est-mCnc: 117 mg/dL
Hgb A1c MFr Bld: 5.7 % — ABNORMAL HIGH (ref 4.8–5.6)

## 2023-10-21 LAB — VITAMIN D 25 HYDROXY (VIT D DEFICIENCY, FRACTURES): Vit D, 25-Hydroxy: 38.8 ng/mL (ref 30.0–100.0)

## 2023-10-21 LAB — VITAMIN B12: Vitamin B-12: 526 pg/mL (ref 232–1245)

## 2023-11-18 ENCOUNTER — Ambulatory Visit
Admission: RE | Admit: 2023-11-18 | Discharge: 2023-11-18 | Disposition: A | Discharge status: Home / Self Care | Source: Ambulatory Visit | Attending: Family Medicine | Admitting: Family Medicine

## 2023-11-18 DIAGNOSIS — Z1231 Encounter for screening mammogram for malignant neoplasm of breast: Secondary | ICD-10-CM

## 2023-11-24 ENCOUNTER — Ambulatory Visit: Payer: Self-pay | Admitting: Family Medicine

## 2023-11-28 ENCOUNTER — Other Ambulatory Visit: Payer: Self-pay | Admitting: Family Medicine

## 2023-11-28 DIAGNOSIS — R7303 Prediabetes: Secondary | ICD-10-CM

## 2023-11-30 DIAGNOSIS — M5459 Other low back pain: Secondary | ICD-10-CM | POA: Diagnosis not present

## 2023-11-30 DIAGNOSIS — M9903 Segmental and somatic dysfunction of lumbar region: Secondary | ICD-10-CM | POA: Diagnosis not present

## 2023-11-30 DIAGNOSIS — M9901 Segmental and somatic dysfunction of cervical region: Secondary | ICD-10-CM | POA: Diagnosis not present

## 2023-11-30 DIAGNOSIS — M542 Cervicalgia: Secondary | ICD-10-CM | POA: Diagnosis not present

## 2023-12-01 DIAGNOSIS — K080 Exfoliation of teeth due to systemic causes: Secondary | ICD-10-CM | POA: Diagnosis not present

## 2023-12-23 ENCOUNTER — Telehealth: Payer: Self-pay

## 2024-01-06 ENCOUNTER — Ambulatory Visit: Attending: Cardiovascular Disease | Admitting: Cardiovascular Disease

## 2024-01-06 ENCOUNTER — Encounter: Payer: Self-pay | Admitting: Cardiovascular Disease

## 2024-01-06 VITALS — BP 140/68 | HR 71 | Ht 65.0 in | Wt 146.6 lb

## 2024-01-06 DIAGNOSIS — R7303 Prediabetes: Secondary | ICD-10-CM

## 2024-01-06 DIAGNOSIS — E782 Mixed hyperlipidemia: Secondary | ICD-10-CM | POA: Diagnosis not present

## 2024-01-06 DIAGNOSIS — R000 Tachycardia, unspecified: Secondary | ICD-10-CM

## 2024-01-06 DIAGNOSIS — R002 Palpitations: Secondary | ICD-10-CM | POA: Diagnosis not present

## 2024-01-06 DIAGNOSIS — I700 Atherosclerosis of aorta: Secondary | ICD-10-CM | POA: Diagnosis not present

## 2024-01-06 DIAGNOSIS — I10 Essential (primary) hypertension: Secondary | ICD-10-CM

## 2024-01-06 NOTE — Patient Instructions (Addendum)
 Medication Instructions:  No changes  If you need a refill on your cardiac medications before your next appointment, please call your pharmacy.   Lab work: No new labs needed  Testing/Procedures: Your physician has recommended that you have CT Coronary Calcium Score.  - $99 out of pocket cost at the time of your test - Call [phone] to schedule at your convenience.  [facility] [address] [place], [place] [address]   Coronary CalciumScan A coronary calcium scan is an imaging test used to look for deposits of calcium and other fatty materials (plaques) in the inner lining of the blood vessels of the heart (coronary arteries). These deposits of calcium and plaques can partly clog and narrow the coronary arteries without producing any symptoms or warning signs. This puts a person at risk for a heart attack. This test can detect these deposits before symptoms develop. Tell a health care provider about: Any allergies you have. All medicines you are taking, including vitamins, herbs, eye drops, creams, and over-the-counter medicines. Any problems you or family members have had with anesthetic medicines. Any blood disorders you have. Any surgeries you have had. Any medical conditions you have. Whether you are pregnant or may be pregnant. What are the risks? Generally, this is a safe procedure. However, problems may occur, including: Harm to a pregnant woman and her unborn baby. This test involves the use of radiation. Radiation exposure can be dangerous to a pregnant woman and her unborn baby. If you are pregnant, you generally should not have this procedure done. Slight increase in the risk of cancer. This is because of the radiation involved in the test. What happens before the procedure? No preparation is needed for this procedure. What happens during the procedure? You will undress and remove any jewelry around your neck or  chest. You will put on a hospital gown. Sticky electrodes will be placed on your chest. The electrodes will be connected to an electrocardiogram (ECG) machine to record a tracing of the electrical activity of your heart. A CT scanner will take pictures of your heart. During this time, you will be asked to lie still and hold your breath for 2-3 seconds while a picture of your heart is being taken. The procedure may vary among health care providers and hospitals. What happens after the procedure? You can get dressed. You can return to your normal activities. It is up to you to get the results of your test. Ask your health care provider, or the department that is doing the test, when your results will be ready. Summary A coronary calcium scan is an imaging test used to look for deposits of calcium and other fatty materials (plaques) in the inner lining of the blood vessels of the heart (coronary arteries). Generally, this is a safe procedure. Tell your health care provider if you are pregnant or may be pregnant. No preparation is needed for this procedure. A CT scanner will take pictures of your heart. You can return to your normal activities after the scan is done. This information is not intended to replace advice given to you by your health care provider. Make sure you discuss any questions you have with your health care provider. Document Released: 07/10/2007 Document Revised: 12/01/2015 Document Reviewed: 12/01/2015 Elsevier Interactive Patient Education  2017 [employer].   Follow-Up: At [facility], you and your health needs are our priority.  As part of our continuing mission to provide you with exceptional heart  care, we have created designated Provider Care Teams.  These Care Teams include your primary Cardiologist (physician) and Advanced Practice Providers (APPs -  Physician Assistants and Nurse Practitioners) who all work together to provide you with the care you need, when you  need it.  You will need a follow up appointment in 12 months  Providers on your designated Care Team:   Lonni Meager, NP Bernardino Bring, PA-C Cadence [place], [place]  COVID-19 Vaccine Information can be found at: [email or web address] For questions related to vaccine distribution or appointments, please email [email or web address] or call [phone].
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
## 2024-01-30 ENCOUNTER — Ambulatory Visit
Admission: RE | Admit: 2024-01-30 | Discharge: 2024-01-30 | Disposition: A | Discharge status: Home / Self Care | Payer: Self-pay | Source: Ambulatory Visit | Attending: Cardiovascular Disease | Admitting: Cardiovascular Disease

## 2024-01-30 DIAGNOSIS — E782 Mixed hyperlipidemia: Secondary | ICD-10-CM | POA: Insufficient documentation

## 2024-01-31 ENCOUNTER — Telehealth: Payer: Self-pay

## 2024-01-31 ENCOUNTER — Other Ambulatory Visit: Payer: Self-pay

## 2024-01-31 ENCOUNTER — Telehealth: Payer: Self-pay | Admitting: Cardiovascular Disease

## 2024-01-31 DIAGNOSIS — Z860100 Personal history of colon polyps, unspecified: Secondary | ICD-10-CM

## 2024-01-31 MED ORDER — PEG 3350-KCL-NA BICARB-NACL 420 G PO SOLR: ORAL | 0 refills | Status: AC

## 2024-01-31 NOTE — Telephone Encounter (Signed)
"  ° °  Pre-operative Risk Assessment    Patient Name: Tracie Hall  DOB: [birth year] MRN: [id number]   Date of last office visit: 01/06/24 Date of next office visit: n/a   Request for Surgical Clearance    Procedure:  Colonoscopy  Date of Surgery:  Clearance TBD                                Surgeon:  not indicated  Surgeon's Group or Practice Name:  [facility] Phone number:  [phone] Fax number:  [phone]   Type of Clearance Requested:   - Medical    Type of Anesthesia:  General    Additional requests/questions:    Signed, Arsenio Celine GAILS   01/31/2024, 3:07 PM   "

## 2024-01-31 NOTE — Telephone Encounter (Signed)
"  ° °  Patient Name: Tracie Hall  DOB: 12-24-1955 MRN: [id number]  Primary Cardiologist: None  Chart reviewed as part of pre-operative protocol coverage.  Patient was recently seen in clinic by Dr. Gollan on 01/06/2024, she remained stable from a cardiac standpoint and was able to achieve greater than 4 METS of activity.  Given past medical history and time since last visit, based on ACC/AHA guidelines, Tracie Hall is at acceptable risk for the planned procedure without further cardiovascular testing.   I will route this recommendation to the requesting party via Epic fax function and remove from pre-op pool.  Please call with questions.  Jariah Jarmon D Syrianna Schillaci, NP 01/31/2024, 3:56 PM  "

## 2024-01-31 NOTE — Telephone Encounter (Signed)
 Cardiac clearance was faxed to Dr. Tresia office. Awaiting on response.

## 2024-01-31 NOTE — Telephone Encounter (Signed)
 Gastroenterology Pre-Procedure Review  Request Date: 08/02/2024 Requesting Physician: Dr. Jinny  PATIENT REVIEW QUESTIONS: The patient responded to the following health history questions as indicated:    1. Are you having any GI issues? no 2. Do you have a personal history of Polyps? yes (Dr. Jinny Polyps 07/02/2019) 3. Do you have a family history of Colon Cancer or Polyps? no 4. Diabetes Mellitus? no 5. Joint replacements in the past 12 months?no 6. Major health problems in the past 3 months?no 7. Any artificial heart valves, MVP, or defibrillator?no    MEDICATIONS & ALLERGIES:    Patient reports the following regarding taking any anticoagulation/antiplatelet therapy:   Plavix, Coumadin, Eliquis, Xarelto, Lovenox, Pradaxa, Brilinta, or Effient? no Aspirin? no  Patient confirms/reports the following medications:  Current Outpatient Medications  Medication Sig Dispense Refill   Blood Glucose Monitoring Suppl (ONE TOUCH ULTRA 2) w/Device KIT Use as directed 1 kit 0   CALCIUM-VITAMIN D  PO Take 600 mg by mouth in the morning and at bedtime.     cyanocobalamin  1000 MCG tablet Take 1,000 mcg by mouth 3 (three) times a week.     glucose blood (ONETOUCH ULTRA) test strip USE TO CHECK BLOOD GLUCOSE EVERY DAY 100 strip 3   Lancets (ONETOUCH DELICA PLUS LANCET33G) MISC USE TO CHECK BLOOD GLUCOSE EVERY DAY 100 each 1   losartan  (COZAAR ) 50 MG tablet TAKE 1 TABLET(50 MG) BY MOUTH DAILY 90 tablet 3   metoprolol  succinate (TOPROL -XL) 50 MG 24 hr tablet Take 1 tablet (50 mg total) by mouth daily. Take with or immediately following a meal. 90 tablet 3   No current facility-administered medications for this visit.    Patient confirms/reports the following allergies:  Allergies[1]  No orders of the defined types were placed in this encounter.   AUTHORIZATION INFORMATION Primary Insurance: 1D#: Group #:  Secondary Insurance: 1D#: Group #:  SCHEDULE INFORMATION: Date:  08/02/2024 Time: Location: [facility] Dr. Jinny    [1] No Known Allergies

## 2024-02-06 ENCOUNTER — Ambulatory Visit: Payer: Self-pay | Admitting: Cardiovascular Disease

## 2024-02-06 ENCOUNTER — Encounter: Payer: Self-pay | Admitting: Emergency Medicine

## 2024-02-16 NOTE — Telephone Encounter (Signed)
 Cardiac clearance was obtained. Please read cardiologist note from telephone note 01/31/2024.

## 2024-02-18 ENCOUNTER — Emergency Department

## 2024-02-18 ENCOUNTER — Encounter: Payer: Self-pay | Admitting: Emergency Medicine

## 2024-02-18 ENCOUNTER — Other Ambulatory Visit: Payer: Self-pay

## 2024-02-18 ENCOUNTER — Emergency Department
Admission: EM | Admit: 2024-02-18 | Discharge: 2024-02-18 | Disposition: A | Discharge status: Home / Self Care | Attending: Emergency Medicine | Admitting: Emergency Medicine

## 2024-02-18 DIAGNOSIS — N950 Postmenopausal bleeding: Secondary | ICD-10-CM | POA: Diagnosis not present

## 2024-02-18 DIAGNOSIS — I10 Essential (primary) hypertension: Secondary | ICD-10-CM | POA: Diagnosis not present

## 2024-02-18 DIAGNOSIS — N390 Urinary tract infection, site not specified: Secondary | ICD-10-CM | POA: Diagnosis not present

## 2024-02-18 DIAGNOSIS — R300 Dysuria: Secondary | ICD-10-CM | POA: Diagnosis present

## 2024-02-18 LAB — URINALYSIS, W/ REFLEX TO CULTURE (INFECTION SUSPECTED)
Bilirubin Urine: NEGATIVE
Glucose, UA: NEGATIVE mg/dL
Ketones, ur: NEGATIVE mg/dL
Nitrite: NEGATIVE
Protein, ur: 100 mg/dL — AB
RBC / HPF: >50 RBC/hpf (ref 0–5)
Specific Gravity, Urine: 1.005 (ref 1.005–1.030)
pH: 6 (ref 5.0–8.0)

## 2024-02-18 LAB — CBC WITH DIFFERENTIAL/PLATELET
Abs Immature Granulocytes: 0.04 10*3/uL (ref 0.00–0.07)
Basophils Absolute: 0 10*3/uL (ref 0.0–0.1)
Basophils Relative: 0 %
Eosinophils Absolute: 0.1 10*3/uL (ref 0.0–0.5)
Eosinophils Relative: 1 %
HCT: 39.3 % (ref 36.0–46.0)
Hemoglobin: 12.6 g/dL (ref 12.0–15.0)
Immature Granulocytes: 0 %
Lymphocytes Relative: 6 %
Lymphs Abs: 0.6 10*3/uL — ABNORMAL LOW (ref 0.7–4.0)
MCH: 29.5 pg (ref 26.0–34.0)
MCHC: 32.1 g/dL (ref 30.0–36.0)
MCV: 92 fL (ref 80.0–100.0)
Monocytes Absolute: 0.5 10*3/uL (ref 0.1–1.0)
Monocytes Relative: 5 %
Neutro Abs: 9.7 10*3/uL — ABNORMAL HIGH (ref 1.7–7.7)
Neutrophils Relative %: 88 %
Platelets: 206 10*3/uL (ref 150–400)
RBC: 4.27 MIL/uL (ref 3.87–5.11)
RDW: 12.7 % (ref 11.5–15.5)
WBC: 11 10*3/uL — ABNORMAL HIGH (ref 4.0–10.5)
nRBC: 0 % (ref 0.0–0.2)

## 2024-02-18 LAB — BASIC METABOLIC PANEL WITH GFR
Anion gap: 9 (ref 5–15)
BUN: 19 mg/dL (ref 8–23)
CO2: 28 mmol/L (ref 22–32)
Calcium: 9.9 mg/dL (ref 8.9–10.3)
Chloride: 101 mmol/L (ref 98–111)
Creatinine, Ser: 0.77 mg/dL (ref 0.44–1.00)
GFR, Estimated: >60 mL/min
Glucose, Bld: 120 mg/dL — ABNORMAL HIGH (ref 70–99)
Potassium: 4.1 mmol/L (ref 3.5–5.1)
Sodium: 138 mmol/L (ref 135–145)

## 2024-02-18 MED ORDER — CEFDINIR 300 MG PO CAPS: 300.0000 mg | ORAL_CAPSULE | Freq: Two times a day (BID) | ORAL | 0 refills | Status: AC

## 2024-02-18 NOTE — ED Provider Notes (Signed)
 "  [facility] Provider Note    Event Date/Time   First MD Initiated Contact with Patient 02/18/24 [phone]     (approximate)   History   Dysuria   HPI  Tracie Hall is a 69 y.o. female with a past medical history of stenosis of vagina who presents today for evaluation of urinary urgency that began last night, and hematuria this morning.  She reports that she has been using estradiol and dilators in preparation for her annual exam and this had gone up a size on her dilator on Thursday.  Patient reports that the bleeding did not occur until 24 hours later.  She reports that she noticed blood in the urine in the toilet bowl this morning at 3 AM, and then earlier this morning as well.  She denies any discomfort or pain.  She reports that she has had urinary urgency without burning.  No abdominal pain or flank pain.  No fevers or chills.  Patient Active Problem List   Diagnosis Date Noted   Aortic atherosclerosis 10/14/2022   Osteopenia 03/18/2022   Hyperlipidemia 03/19/2021   Patellofemoral arthralgia of right knee 03/19/2021   Other microscopic hematuria 09/16/2020   GERD (gastroesophageal reflux disease) 04/22/2020   History of colonic polyps    Varicose veins of both lower extremities 11/07/2017   Prediabetes 10/20/2017   Paresthesias 09/14/2016   Vitamin B 12 deficiency 07/14/2015   Cervical osteoarthritis 05/21/2015   Benign essential tremor 05/21/2015   Personal history of other diseases of the musculoskeletal system and connective tissue 05/21/2015   Phlebectasia 05/21/2015   Avitaminosis D 05/21/2015   Stenosis of vagina 05/07/2015   Vaginal atrophy 05/07/2015   Hypertension 08/19/2014          Physical Exam   Triage Vital Signs: ED Triage Vitals  Encounter Vitals Group     BP 02/18/24 0752 (!) 173/91     Girls Systolic BP Percentile --      Girls Diastolic BP Percentile --      Boys Systolic BP Percentile --      Boys Diastolic BP  Percentile --      Pulse Rate 02/18/24 0752 (!) 102     Resp 02/18/24 0752 16     Temp 02/18/24 0752 97.8 F (36.6 C)     Temp Source 02/18/24 0752 Oral     SpO2 02/18/24 0752 96 %     Weight 02/18/24 0753 146 lb 9.7 oz (66.5 kg)     Height --      Head Circumference --      Peak Flow --      Pain Score 02/18/24 0753 0     Pain Loc --      Pain Education --      Exclude from Growth Chart --     Most recent vital signs: Vitals:   02/18/24 0752  BP: (!) 173/91  Pulse: (!) 102  Resp: 16  Temp: 97.8 F (36.6 C)  SpO2: 96%    Physical Exam Vitals and nursing note reviewed.  Constitutional:      General: Awake and alert. No acute distress.    Appearance: Normal appearance. The patient is normal weight.  HENT:     Head: Normocephalic and atraumatic.     Mouth: Mucous membranes are moist.  Eyes:     General: PERRL. Normal EOMs        Right eye: No discharge.        Left  eye: No discharge.     Conjunctiva/sclera: Conjunctivae normal.  Cardiovascular:     Rate and Rhythm: Normal rate and regular rhythm.     Pulses: Normal pulses.  Pulmonary:     Effort: Pulmonary effort is normal. No respiratory distress.     Breath sounds: Normal breath sounds.  Abdominal:     Abdomen is soft. There is no abdominal tenderness. No rebound or guarding. No distention.  No CVA tenderness External exam performed with Bernice RN without obvious trauma.  Patient declined internal exam Musculoskeletal:        General: No swelling. Normal range of motion.     Cervical back: Normal range of motion and neck supple.  Skin:    General: Skin is warm and dry.     Capillary Refill: Capillary refill takes less than 2 seconds.     Findings: No rash.  Neurological:     Mental Status: The patient is awake and alert.      ED Results / Procedures / Treatments   Labs (all labs ordered are listed, but only abnormal results are displayed) Labs Reviewed  BASIC METABOLIC PANEL WITH GFR - Abnormal;  Notable for the following components:      Result Value   Glucose, Bld 120 (*)    All other components within normal limits  CBC WITH DIFFERENTIAL/PLATELET - Abnormal; Notable for the following components:   WBC 11.0 (*)    Neutro Abs 9.7 (*)    Lymphs Abs 0.6 (*)    All other components within normal limits  URINALYSIS, W/ REFLEX TO CULTURE (INFECTION SUSPECTED) - Abnormal; Notable for the following components:   Color, Urine AMBER (*)    APPearance CLEAR (*)    Hgb urine dipstick MODERATE (*)    Protein, ur 100 (*)    Leukocytes,Ua MODERATE (*)    Bacteria, UA MANY (*)    All other components within normal limits  URINE CULTURE     EKG     RADIOLOGY I independently reviewed and interpreted imaging and agree with radiologists findings.     PROCEDURES:  Critical Care performed:   Procedures   MEDICATIONS ORDERED IN ED: Medications - No data to display   IMPRESSION / MDM / ASSESSMENT AND PLAN / ED COURSE  I reviewed the triage vital signs and the nursing notes.   Differential diagnosis includes, but is not limited to, urinary tract infection, postmenopausal vaginal bleeding, internal trauma from dilator such as abrasion.  I reviewed the patient's chart.  Patient follows with Dr. Verdon.  Patient is awake and alert, nontoxic in appearance.  Abdomen is soft and nontender throughout.  External vaginal exam performed with RN does not reveal any obvious sources of trauma or bleeding.  Given the possibility of postmenopausal bleeding as patient is unable to ascertain whether bleeding is from vagina or within urine, ultrasound obtained.  However patient declined transvaginal ultrasound.  I discussed with tech who recommended that she have a very full bladder for this procedure, and therefore she was given fluids to drink.  When her bladder was full, ultrasound was performed, which does reveal a mildly thickened endometrial stripe to 6 mm.  I discussed this finding with the  patient and recommended that she discuss endometrial biopsy with her OB/GYN.  She has a scheduled appointment within the next 1 to 2 weeks, though plans to call to see if she should have this done sooner.  Urinalysis is suggestive of a urinary tract infection, so we  will treat with antibiotic.  No flank pain, fever, abdominal pain, vomiting to suggest pyelonephritis.  Labs are overall reassuring.  We discussed return precautions and outpatient follow-up.  Patient understands and agrees with plan.  She was discharged in stable condition.  Patient's presentation is most consistent with acute complicated illness / injury requiring diagnostic workup.    FINAL CLINICAL IMPRESSION(S) / ED DIAGNOSES   Final diagnoses:  Post-menopausal bleeding  Lower urinary tract infectious disease     Rx / DC Orders   ED Discharge Orders          Ordered    cefdinir  (OMNICEF ) 300 MG capsule  2 times daily        02/18/24 1127             Note:  This document was prepared using Dragon voice recognition software and may include unintentional dictation errors.   Samyra Limb E, PA-C 02/18/24 1446    Dorothyann Drivers, MD 02/18/24 1511  "

## 2024-02-18 NOTE — Discharge Instructions (Signed)
 Your urinalysis does show evidence of a urinary tract infection, which likely is your cause of urinary frequency.  Please take the antibiotic as prescribed.  Ultrasound was also obtained given the possibility of vaginal bleeding, and does reveal a thickened endometrium.  Please call your OB/GYN and discuss the option of an endometrial biopsy.  Please return for any new, worsening, or changing symptoms or other concerns.  It was a pleasure caring for you today.

## 2024-02-18 NOTE — ED Triage Notes (Signed)
 C/O urinary urgency that started last night and this morning noticed some hematuria.  Patient states has been using Estradial and dilators in preparation for her annual exam and had just gone up a size on her dialator on Thursday.

## 2024-02-20 LAB — URINE CULTURE: Culture: >=100000 — AB

## 2024-02-25 ENCOUNTER — Other Ambulatory Visit: Payer: Self-pay | Admitting: Cardiovascular Disease

## 2024-02-25 DIAGNOSIS — I10 Essential (primary) hypertension: Secondary | ICD-10-CM

## 2024-04-19 ENCOUNTER — Ambulatory Visit: Admitting: Family Medicine

## 2024-08-02 ENCOUNTER — Ambulatory Visit: Admit: 2024-08-02 | Admitting: Gastroenterology

## 2024-08-02 SURGERY — COLONOSCOPY
Anesthesia: General

## 2024-10-23 ENCOUNTER — Ambulatory Visit

## 2024-10-25 ENCOUNTER — Encounter: Admitting: Family Medicine
